# Patient Record
Sex: Female | Born: 1940 | Race: White | Hispanic: No | Marital: Married | Smoking: Former smoker
Health system: Southern US, Community
[De-identification: ages and names within clinical notes are randomized; demographics above are authoritative.]

## PROBLEM LIST (undated history)

## (undated) DIAGNOSIS — B009 Herpesviral infection, unspecified: Secondary | ICD-10-CM

## (undated) DIAGNOSIS — M8580 Other specified disorders of bone density and structure, unspecified site: Secondary | ICD-10-CM

## (undated) DIAGNOSIS — N83202 Unspecified ovarian cyst, left side: Secondary | ICD-10-CM

## (undated) DIAGNOSIS — K582 Mixed irritable bowel syndrome: Secondary | ICD-10-CM

## (undated) DIAGNOSIS — K219 Gastro-esophageal reflux disease without esophagitis: Secondary | ICD-10-CM

## (undated) DIAGNOSIS — I639 Cerebral infarction, unspecified: Secondary | ICD-10-CM

## (undated) DIAGNOSIS — R569 Unspecified convulsions: Secondary | ICD-10-CM

## (undated) DIAGNOSIS — E559 Vitamin D deficiency, unspecified: Secondary | ICD-10-CM

## (undated) DIAGNOSIS — J189 Pneumonia, unspecified organism: Secondary | ICD-10-CM

## (undated) DIAGNOSIS — Z8742 Personal history of other diseases of the female genital tract: Secondary | ICD-10-CM

## (undated) DIAGNOSIS — Z85820 Personal history of malignant melanoma of skin: Secondary | ICD-10-CM

## (undated) DIAGNOSIS — D0462 Carcinoma in situ of skin of left upper limb, including shoulder: Secondary | ICD-10-CM

## (undated) DIAGNOSIS — Z8619 Personal history of other infectious and parasitic diseases: Secondary | ICD-10-CM

## (undated) DIAGNOSIS — N83201 Unspecified ovarian cyst, right side: Secondary | ICD-10-CM

## (undated) DIAGNOSIS — C439 Malignant melanoma of skin, unspecified: Secondary | ICD-10-CM

## (undated) DIAGNOSIS — K589 Irritable bowel syndrome without diarrhea: Secondary | ICD-10-CM

## (undated) DIAGNOSIS — A0472 Enterocolitis due to Clostridium difficile, not specified as recurrent: Secondary | ICD-10-CM

## (undated) DIAGNOSIS — Z973 Presence of spectacles and contact lenses: Secondary | ICD-10-CM

## (undated) DIAGNOSIS — C801 Malignant (primary) neoplasm, unspecified: Secondary | ICD-10-CM

## (undated) DIAGNOSIS — N950 Postmenopausal bleeding: Secondary | ICD-10-CM

## (undated) DIAGNOSIS — I2699 Other pulmonary embolism without acute cor pulmonale: Secondary | ICD-10-CM

## (undated) DIAGNOSIS — L900 Lichen sclerosus et atrophicus: Secondary | ICD-10-CM

## (undated) DIAGNOSIS — R9389 Abnormal findings on diagnostic imaging of other specified body structures: Secondary | ICD-10-CM

## (undated) DIAGNOSIS — A6000 Herpesviral infection of urogenital system, unspecified: Secondary | ICD-10-CM

## (undated) HISTORY — DX: Unspecified ovarian cyst, right side: N83.201

## (undated) HISTORY — PX: OTHER SURGICAL HISTORY: SHX169

## (undated) HISTORY — DX: Lichen sclerosus et atrophicus: L90.0

## (undated) HISTORY — PX: CATARACT EXTRACTION W/ INTRAOCULAR LENS IMPLANT: SHX1309

## (undated) HISTORY — DX: Irritable bowel syndrome without diarrhea: K58.9

## (undated) HISTORY — DX: Vitamin D deficiency, unspecified: E55.9

## (undated) HISTORY — DX: Unspecified convulsions: R56.9

## (undated) HISTORY — DX: Cerebral infarction, unspecified: I63.9

## (undated) HISTORY — DX: Other pulmonary embolism without acute cor pulmonale: I26.99

## (undated) HISTORY — DX: Other specified disorders of bone density and structure, unspecified site: M85.80

## (undated) HISTORY — DX: Malignant melanoma of skin, unspecified: C43.9

## (undated) HISTORY — DX: Herpesviral infection, unspecified: B00.9

## (undated) HISTORY — DX: Unspecified ovarian cyst, right side: N83.202

## (undated) HISTORY — DX: Carcinoma in situ of skin of left upper limb, including shoulder: D04.62

## (undated) HISTORY — DX: Enterocolitis due to Clostridium difficile, not specified as recurrent: A04.72

---
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
## 1967-04-19 HISTORY — PX: VARICOSE VEIN SURGERY: SHX832

## 1970-04-18 HISTORY — PX: DILATION AND CURETTAGE OF UTERUS: SHX78

## 1973-04-18 DIAGNOSIS — I2699 Other pulmonary embolism without acute cor pulmonale: Secondary | ICD-10-CM

## 1973-04-18 DIAGNOSIS — Z86711 Personal history of pulmonary embolism: Secondary | ICD-10-CM

## 1973-04-18 HISTORY — PX: BREAST ENHANCEMENT SURGERY: SHX7

## 1973-04-18 HISTORY — DX: Other pulmonary embolism without acute cor pulmonale: I26.99

## 1973-04-18 HISTORY — DX: Personal history of pulmonary embolism: Z86.711

## 1973-04-18 HISTORY — PX: TUBAL LIGATION: SHX77

## 1973-04-18 HISTORY — PX: AUGMENTATION MAMMAPLASTY: SUR837

## 1994-04-18 DIAGNOSIS — I6930 Unspecified sequelae of cerebral infarction: Secondary | ICD-10-CM

## 1994-04-18 DIAGNOSIS — I639 Cerebral infarction, unspecified: Secondary | ICD-10-CM

## 1994-04-18 HISTORY — DX: Cerebral infarction, unspecified: I63.9

## 1994-04-18 HISTORY — DX: Unspecified sequelae of cerebral infarction: I69.30

## 1997-07-17 HISTORY — PX: MELANOMA EXCISION: SHX5266

## 2004-05-12 ENCOUNTER — Ambulatory Visit: Payer: Self-pay | Admitting: Internal Medicine

## 2004-05-21 ENCOUNTER — Other Ambulatory Visit: Admission: RE | Admit: 2004-05-21 | Discharge: 2004-05-21 | Payer: Self-pay | Admitting: Internal Medicine

## 2004-05-21 ENCOUNTER — Ambulatory Visit: Payer: Self-pay | Admitting: Internal Medicine

## 2004-06-09 ENCOUNTER — Ambulatory Visit: Payer: Self-pay | Admitting: Internal Medicine

## 2004-09-16 HISTORY — PX: CATARACT EXTRACTION: SUR2

## 2005-01-03 ENCOUNTER — Ambulatory Visit: Payer: Self-pay | Admitting: Internal Medicine

## 2005-03-02 ENCOUNTER — Ambulatory Visit: Payer: Self-pay | Admitting: Internal Medicine

## 2005-04-18 DIAGNOSIS — K449 Diaphragmatic hernia without obstruction or gangrene: Secondary | ICD-10-CM

## 2005-04-18 HISTORY — DX: Diaphragmatic hernia without obstruction or gangrene: K44.9

## 2005-05-10 ENCOUNTER — Ambulatory Visit: Payer: Self-pay | Admitting: Internal Medicine

## 2005-07-18 ENCOUNTER — Ambulatory Visit: Payer: Self-pay | Admitting: Internal Medicine

## 2005-09-27 ENCOUNTER — Ambulatory Visit: Payer: Self-pay | Admitting: Internal Medicine

## 2005-10-05 ENCOUNTER — Encounter: Admission: RE | Admit: 2005-10-05 | Discharge: 2005-10-05 | Payer: Self-pay | Admitting: Internal Medicine

## 2005-10-21 ENCOUNTER — Ambulatory Visit: Payer: Self-pay

## 2005-10-27 ENCOUNTER — Ambulatory Visit: Payer: Self-pay | Admitting: Internal Medicine

## 2005-11-11 ENCOUNTER — Encounter: Admission: RE | Admit: 2005-11-11 | Discharge: 2005-11-11 | Payer: Self-pay | Admitting: Obstetrics and Gynecology

## 2006-02-02 ENCOUNTER — Ambulatory Visit: Payer: Self-pay | Admitting: *Deleted

## 2006-02-02 ENCOUNTER — Inpatient Hospital Stay (HOSPITAL_COMMUNITY): Admission: EM | Admit: 2006-02-02 | Discharge: 2006-02-04 | Payer: Self-pay | Admitting: Emergency Medicine

## 2006-02-05 ENCOUNTER — Ambulatory Visit (HOSPITAL_COMMUNITY): Admission: RE | Admit: 2006-02-05 | Discharge: 2006-02-05 | Payer: Self-pay | Admitting: Gastroenterology

## 2006-02-08 ENCOUNTER — Ambulatory Visit: Payer: Self-pay | Admitting: Internal Medicine

## 2006-02-21 ENCOUNTER — Ambulatory Visit: Payer: Self-pay | Admitting: Internal Medicine

## 2006-02-23 ENCOUNTER — Ambulatory Visit: Payer: Self-pay | Admitting: Cardiology

## 2006-05-12 ENCOUNTER — Ambulatory Visit: Payer: Self-pay | Admitting: Internal Medicine

## 2006-07-17 ENCOUNTER — Encounter (INDEPENDENT_AMBULATORY_CARE_PROVIDER_SITE_OTHER): Payer: Self-pay | Admitting: *Deleted

## 2006-07-17 ENCOUNTER — Ambulatory Visit (HOSPITAL_COMMUNITY): Admission: RE | Admit: 2006-07-17 | Discharge: 2006-07-17 | Payer: Self-pay | Admitting: Internal Medicine

## 2006-07-17 ENCOUNTER — Ambulatory Visit: Payer: Self-pay | Admitting: *Deleted

## 2006-07-17 ENCOUNTER — Ambulatory Visit: Payer: Self-pay | Admitting: Internal Medicine

## 2006-08-01 ENCOUNTER — Ambulatory Visit: Payer: Self-pay | Admitting: Internal Medicine

## 2006-08-12 ENCOUNTER — Encounter: Admission: RE | Admit: 2006-08-12 | Discharge: 2006-08-12 | Payer: Self-pay | Admitting: Internal Medicine

## 2006-09-01 ENCOUNTER — Ambulatory Visit: Payer: Self-pay | Admitting: Family Medicine

## 2006-09-01 LAB — CONVERTED CEMR LAB
ALT: 19 units/L (ref 0–40)
AST: 21 units/L (ref 0–37)
Basophils Relative: 0.5 % (ref 0.0–1.0)
Bilirubin, Direct: 0.1 mg/dL (ref 0.0–0.3)
Calcium: 9.6 mg/dL (ref 8.4–10.5)
Chloride: 105 meq/L (ref 96–112)
Creatinine, Ser: 0.7 mg/dL (ref 0.4–1.2)
Eosinophils Absolute: 0.1 10*3/uL (ref 0.0–0.6)
Eosinophils Relative: 1.1 % (ref 0.0–5.0)
GFR calc non Af Amer: 90 mL/min
Glucose, Bld: 107 mg/dL — ABNORMAL HIGH (ref 70–99)
HCT: 43.1 % (ref 36.0–46.0)
HDL: 75.5 mg/dL (ref >39.0)
MCV: 93.5 fL (ref 78.0–100.0)
Neutrophils Relative %: 56.6 % (ref 43.0–77.0)
Platelets: 222 10*3/uL (ref 150–400)
RBC: 4.61 M/uL (ref 3.87–5.11)
RDW: 13 % (ref 11.5–14.6)
Total Bilirubin: 0.8 mg/dL (ref 0.3–1.2)
Total CHOL/HDL Ratio: 3.1
Triglycerides: 67 mg/dL (ref 0–149)
VLDL: 13 mg/dL (ref 0–40)
WBC: 4.6 10*3/uL (ref 4.5–10.5)

## 2006-09-04 ENCOUNTER — Ambulatory Visit: Payer: Self-pay | Admitting: Family Medicine

## 2006-09-05 LAB — CONVERTED CEMR LAB: Vitamin B-12: 208 pg/mL — ABNORMAL LOW (ref 211–911)

## 2006-09-13 ENCOUNTER — Encounter: Payer: Self-pay | Admitting: Internal Medicine

## 2006-09-13 ENCOUNTER — Ambulatory Visit: Payer: Self-pay | Admitting: Vascular Surgery

## 2006-10-24 ENCOUNTER — Other Ambulatory Visit: Admission: RE | Admit: 2006-10-24 | Discharge: 2006-10-24 | Payer: Self-pay | Admitting: *Deleted

## 2006-11-14 DIAGNOSIS — Z86718 Personal history of other venous thrombosis and embolism: Secondary | ICD-10-CM

## 2006-11-14 DIAGNOSIS — R569 Unspecified convulsions: Secondary | ICD-10-CM

## 2006-11-14 DIAGNOSIS — Z8679 Personal history of other diseases of the circulatory system: Secondary | ICD-10-CM | POA: Insufficient documentation

## 2006-11-14 DIAGNOSIS — J309 Allergic rhinitis, unspecified: Secondary | ICD-10-CM | POA: Insufficient documentation

## 2006-12-11 ENCOUNTER — Ambulatory Visit (HOSPITAL_COMMUNITY): Admission: RE | Admit: 2006-12-11 | Discharge: 2006-12-11 | Payer: Self-pay | Admitting: *Deleted

## 2006-12-12 ENCOUNTER — Encounter: Admission: RE | Admit: 2006-12-12 | Discharge: 2006-12-12 | Payer: Self-pay | Admitting: *Deleted

## 2007-11-17 HISTORY — PX: CATARACT EXTRACTION: SUR2

## 2007-12-13 ENCOUNTER — Encounter: Admission: RE | Admit: 2007-12-13 | Discharge: 2007-12-13 | Payer: Self-pay | Admitting: Internal Medicine

## 2008-02-05 ENCOUNTER — Other Ambulatory Visit: Admission: RE | Admit: 2008-02-05 | Discharge: 2008-02-05 | Payer: Self-pay | Admitting: Obstetrics and Gynecology

## 2008-08-11 ENCOUNTER — Encounter: Admission: RE | Admit: 2008-08-11 | Discharge: 2008-08-11 | Payer: Self-pay | Admitting: Internal Medicine

## 2008-08-12 IMAGING — MG MM SCREENING W/IMPLANTS
9 series · 9 of 9 positions shown · non-contrast
Comparison: Prior studies.

DG SCREENING W/IMPLANTS
Bilateral CC and MLO view(s) were taken.
Prior study comparison: November 11, 2005, dG diagnostic bilateral.

DIGITAL SCREENING MAMMOGRAM W/IMPLANTS WITH CAD:

[R CC]
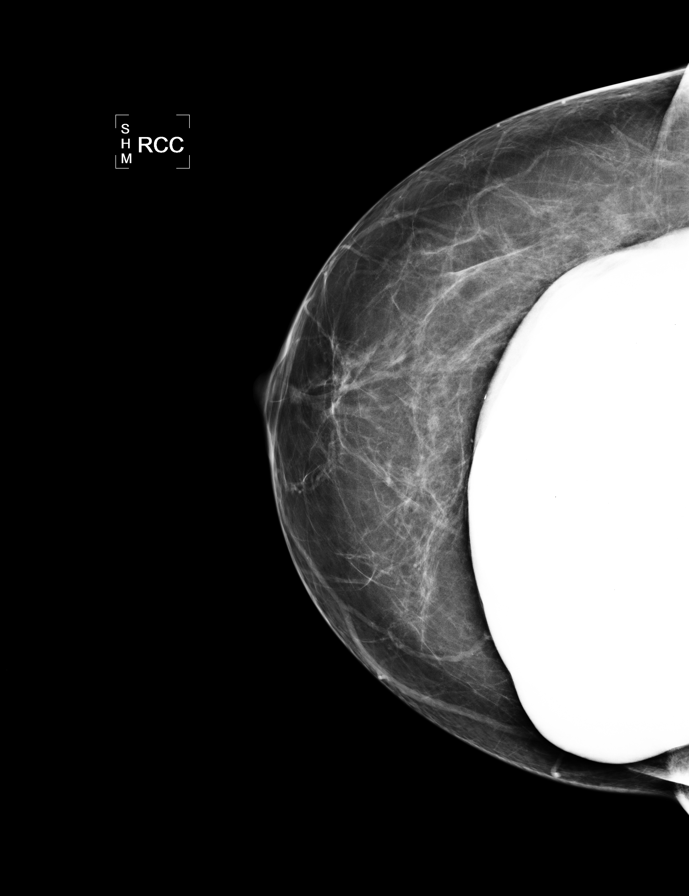

[L CC]
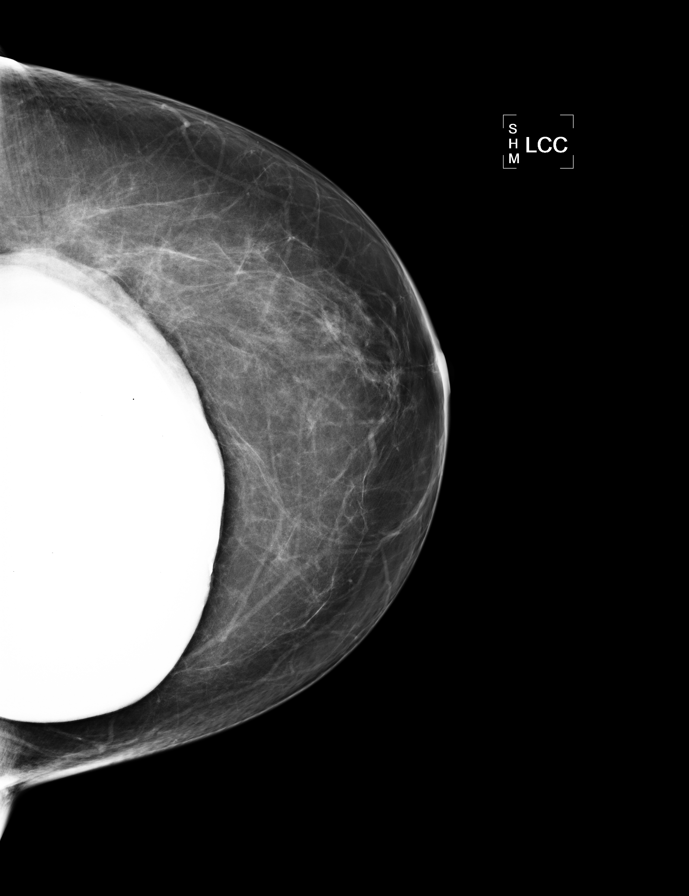

[L MLO]
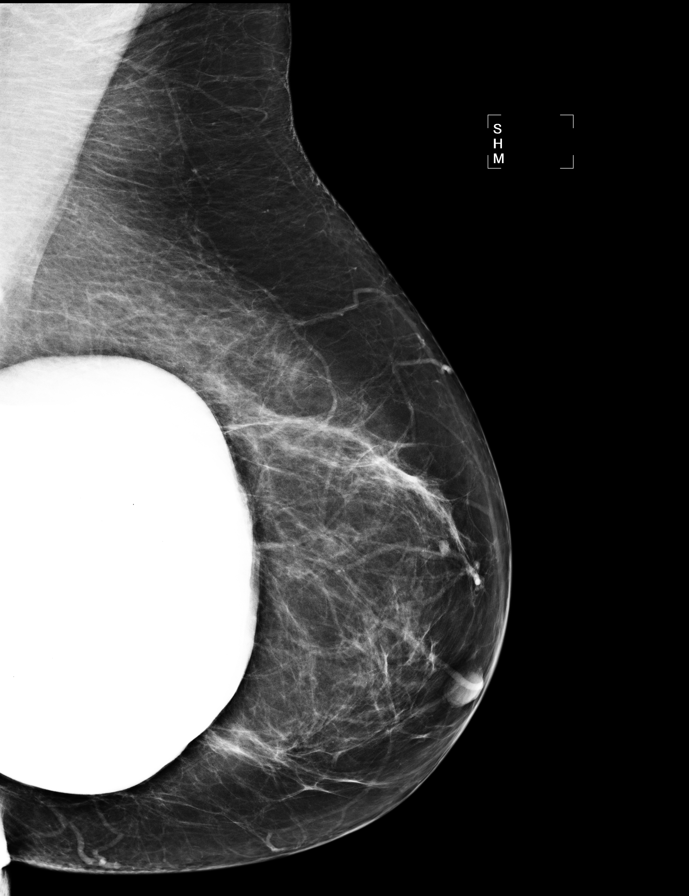

[R MLO]
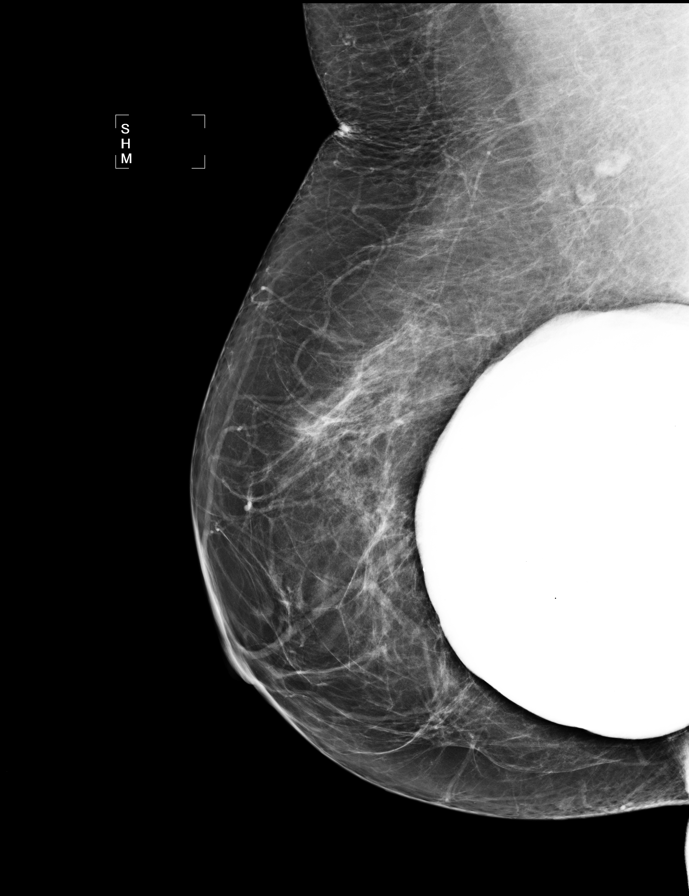

[R CCID]
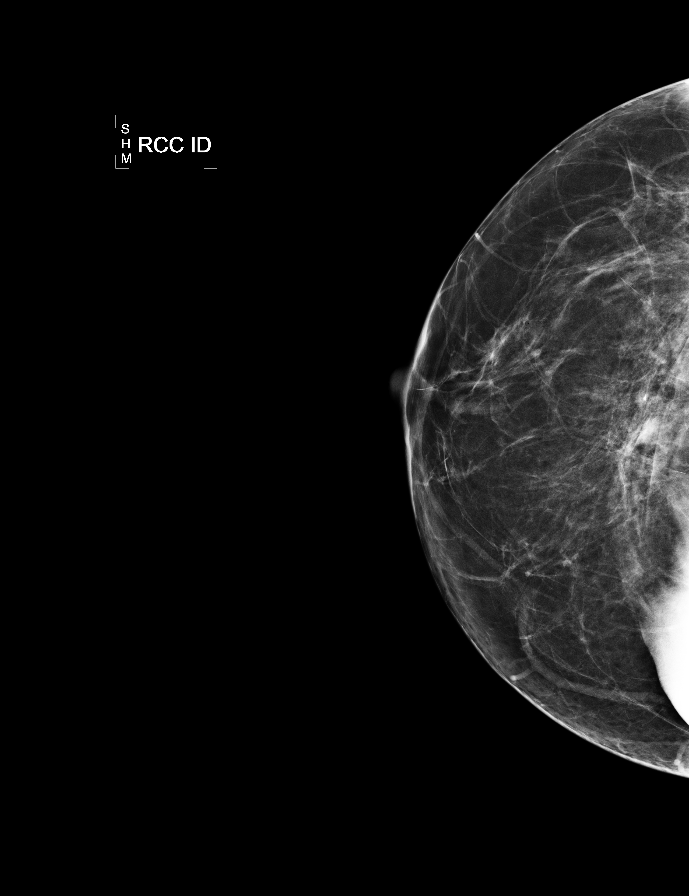

[L CCID (1 of 2)]
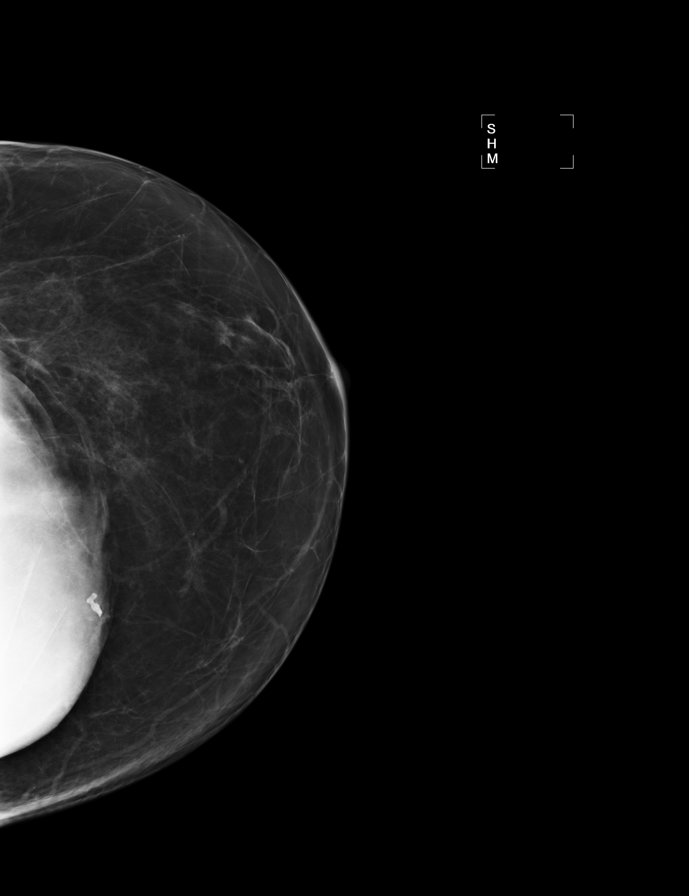

[L MLOID]
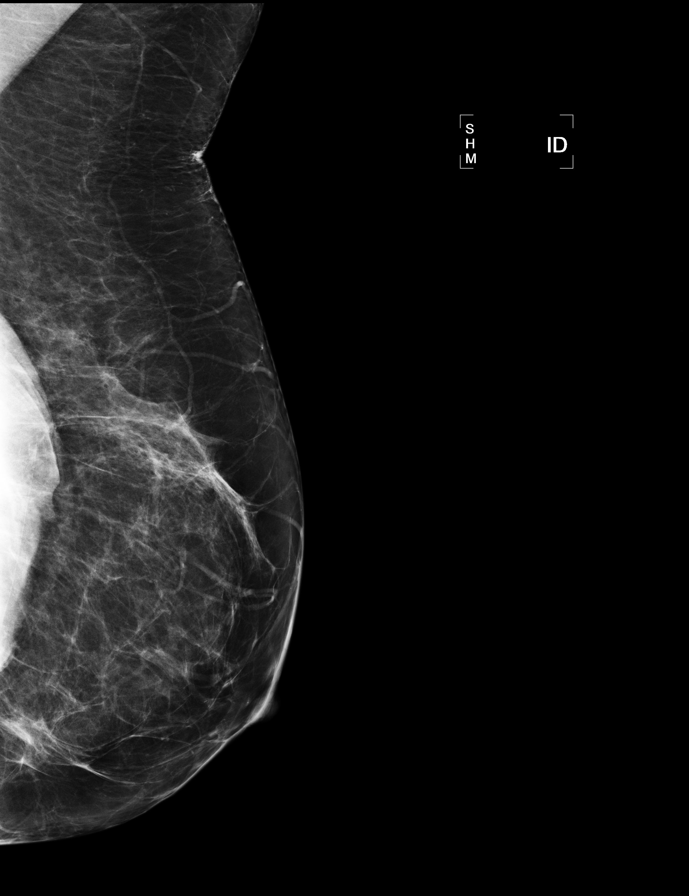

[R MLOID]
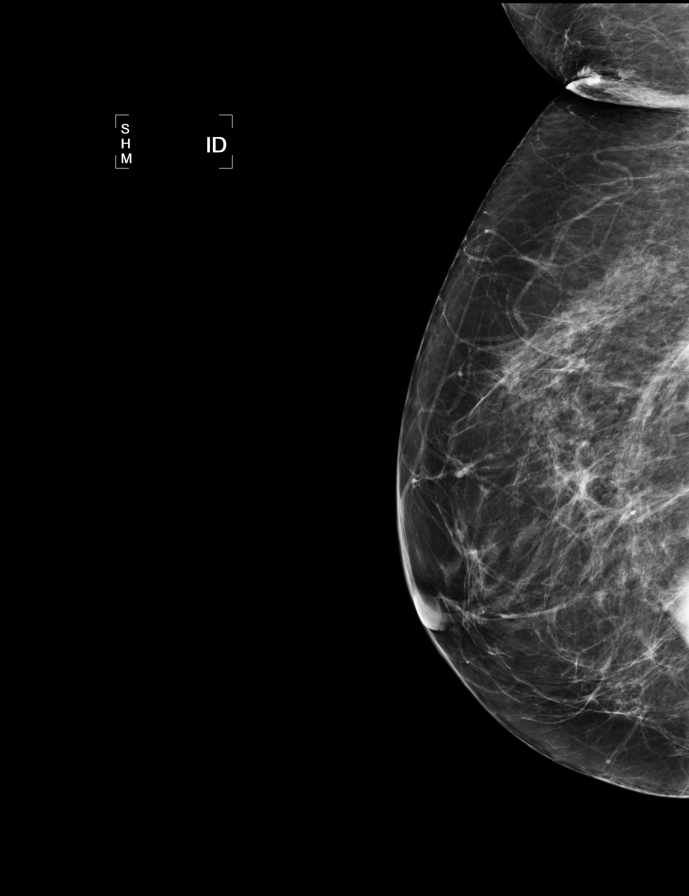

[L CCID (2 of 2)]
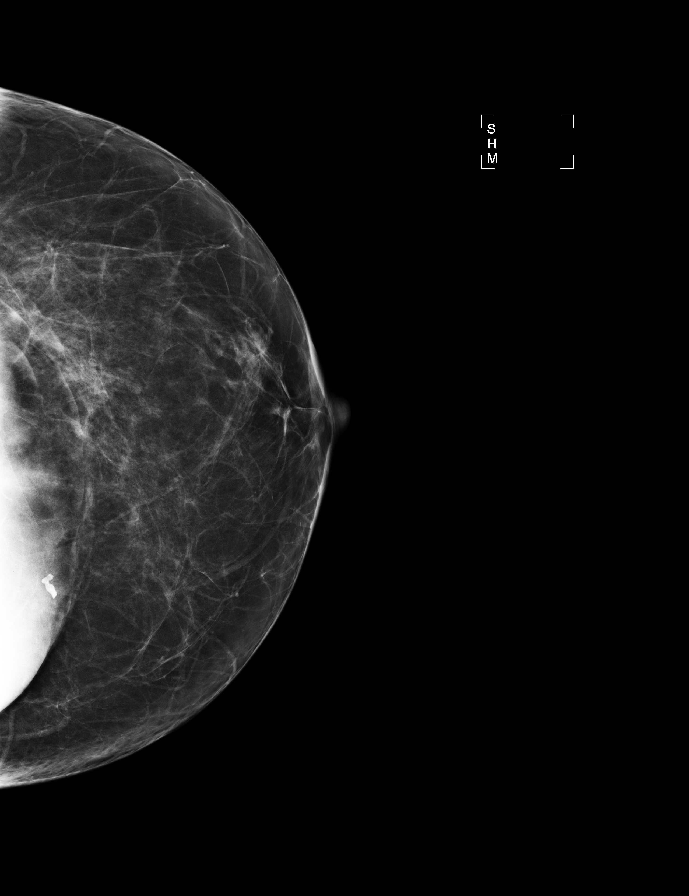

[9 of 9 positions shown; findings below may reference images not displayed]

There are subglandular implants.  Implant included and implant displaced views are obtained.

There are scattered fibroglandular densities.  There is no dominant mass, architectural distortion 
or calcification to suggest malignancy.
IMPRESSION: No mammographic evidence of malignancy.  Suggest yearly screening

ASSESSMENT: Negative - BI-RADS 1

Screening mammogram of both breasts in 1 year.
ANALYZED BY COMPUTER AIDED DETECTION. , THIS PROCEDURE WAS A DIGITAL MAMMOGRAM.

## 2008-08-19 ENCOUNTER — Encounter: Admission: RE | Admit: 2008-08-19 | Discharge: 2008-08-19 | Payer: Self-pay | Admitting: Internal Medicine

## 2008-09-17 ENCOUNTER — Encounter: Admission: RE | Admit: 2008-09-17 | Discharge: 2008-09-17 | Payer: Self-pay | Admitting: Family Medicine

## 2008-12-15 ENCOUNTER — Encounter: Admission: RE | Admit: 2008-12-15 | Discharge: 2008-12-15 | Payer: Self-pay | Admitting: Internal Medicine

## 2009-12-16 ENCOUNTER — Encounter: Admission: RE | Admit: 2009-12-16 | Discharge: 2009-12-16 | Payer: Self-pay | Admitting: Obstetrics and Gynecology

## 2010-03-24 ENCOUNTER — Other Ambulatory Visit
Admission: RE | Admit: 2010-03-24 | Discharge: 2010-03-24 | Payer: Self-pay | Source: Home / Self Care | Admitting: Obstetrics and Gynecology

## 2010-08-02 ENCOUNTER — Encounter (INDEPENDENT_AMBULATORY_CARE_PROVIDER_SITE_OTHER): Payer: Medicare Other

## 2010-08-02 ENCOUNTER — Encounter (INDEPENDENT_AMBULATORY_CARE_PROVIDER_SITE_OTHER): Payer: Medicare Other | Admitting: Vascular Surgery

## 2010-08-02 DIAGNOSIS — I83893 Varicose veins of bilateral lower extremities with other complications: Secondary | ICD-10-CM

## 2010-08-02 NOTE — Consult Note (Signed)
NEW PATIENT CONSULTATION  [name], [name] DOB:  Nov 01, 1940                                       08/02/2010 CHART#:[id number]  The patient is a 70 year old female complaining of bilaterally lower extremity pain, right worse than left, with venous insufficiency.  She has a remote history of DVT in the right popliteal and superficial femoral vein occurring 2 years ago which was treated with aspirin only because of a history of a hemorrhagic stroke.  She describes aching discomfort in both legs in the thigh and calf area particularly behind the right knee but also involving the left leg.  These symptoms can occur while walking, while in bed at night or any time of day and are slightly improved by elevation but not totally relieved.  She has a history of bilateral pulmonary emboli 35 years ago following breast surgery and tubal ligation.  She was evaluated by Dr. Festus Barren in [place] a year ago who recommended "surgery" on the right leg but she never returned for her 3 month followup visit.  She does have elastic compression stockings which she does not wear on a daily basis.  She has no history of bleeding, ulceration or superficial thrombophlebitis or bulging varicosities.  CHRONIC MEDICAL PROBLEMS: 1. History of hemorrhagic stroke. 2. History of right leg DVT. 3. History of melanoma left shoulder previously excised in 1999. 4. History of cataracts. 5. Irritable bowel syndrome. 6. Possible neuropathy. 7. Negative for coronary artery disease, diabetes or COPD.  SOCIAL HISTORY:  She is married, has 3 children, is retired, has not smoked since 1991, drinks occasional glass of wine.  FAMILY HISTORY:  Positive for coronary artery disease and diabetes in her father.  Negative for stroke.  REVIEW OF SYSTEMS:  Positive for weight gain, leg discomfort with walking, history of hemorrhagic stroke in '96, DVT in 2010.  No chest pain or dyspnea on exertion.  No  chronic bronchitis.  Does have a hiatal hernia and occasional constipation.  All other systems are negative in a complete review of systems.  PHYSICAL EXAMINATION:  Vital signs:  Blood pressure 98/72, heart rate 64, respirations 20.  General:  She is a well-developed, well-nourished female in no apparent distress, alert and oriented x3.  HEENT:  Exam is normal for age.  EOMs intact.  Lungs:  Clear to auscultation.  No rhonchi or wheezing.  Cardiovascular:  Regular rhythm.  No murmurs. Carotid pulses 3+, no bruits.  Abdomen:  Soft, nontender with no masses. Musculoskeletal:  Free of major deformities.  Neurological:  Normal. Skin:  Free of rashes.  There are diffuse reticular and spider veins throughout both lower extremities in the anterior thigh, posterior calf particularly in the popliteal area and popliteal fossa on the right side.  There are no bulging varicosities noted.  No hyperpigmentation or ulceration is noted.  She has 3+ femoral and dorsalis pedis pulses palpable bilaterally.  Today I ordered lower extremity venous duplex exam which I reviewed and interpreted.  Left leg is totally normal.  Right leg has reflux in the right great saphenous vein but it is a small vein with maximum distal diameter of 0.34 cm.  There is also some reflux in the right small saphenous vein ranging from 0.31 to 0.25 in diameter.  There is some mild reflux in the right deep system consistent with a previous DVT but  there is no obstruction currently.  I do not think her venous pathology is severe enough to be causing the symptoms in her right leg and this does not explain why her left leg is almost equally symptomatic.  She has no arterial insufficiency.  I think long leg elastic compression stockings worn on a daily basis along with elevation of the legs at night would be helpful.  If she continues to have symptoms possibly a neurologic evaluation would be in order to see if she has neuropathy.  If  her symptoms continue to worsen in the right leg and she develops bulging varicosities then laser ablation of her right great saphenous vein might be indicated at the time but at the current time I do not think that would relieve her symptoms.    [name] [name], M.D. Electronically Signed  JDL/MEDQ  D:  08/02/2010  T:  08/02/2010  Job:  [id number]  cc:   Thora Lance, M.D.

## 2010-08-10 NOTE — Procedures (Unsigned)
LOWER EXTREMITY VENOUS REFLUX EXAM  INDICATION:  Painful varicose veins.  EXAM:  Using color-flow imaging and pulse Doppler spectral analysis, the bilateral common femoral, superficial femoral, popliteal, posterior tibial, greater and lesser saphenous veins are evaluated.  There is evidence suggesting deep venous insufficiency in the right lower extremity.  The right saphenofemoral junction is not competent with Reflux of >543milliseconds. The right GSV is not competent with Reflux of >581milliseconds with the caliber as described below.  The left GSV is competent.  The right proximal short small saphenous vein demonstrates incompetency with caliber ranging from 0.31 to 0.25 cm.  GSV Diameter (used if found to be incompetent only)                                                 Right     Left                Proximal Greater Saphenous Vein     0.63 cm                                                 cm                      Proximal-to-mid-thigh   0.34 cm                                                 cm Mid thigh                                       0.33 cm   cm Mid-distal thigh                                cm        cm Distal thigh                                    0.31 cm   cm Knee                                            0.33 cm   cm  IMPRESSION: 1. Right great saphenous vein is not competent reflux of     >514milliseconds. 2. The right great saphenous vein is not tortuous. 3. The deep venous system is not competent on the with Reflux of     >548milliseconds. 4. The right small saphenous vein is not competent with reflux of     >518milliseconds. 5. 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 6. The left lower extremity appears to be within normal limits.  ___________________________________________ [name] [place], M.D.  LT/MEDQ  D:  08/02/2010  T:  08/02/2010  Job:  [id number] 

## 2010-08-31 NOTE — Consult Note (Signed)
VASCULAR SURGERY CONSULTATION   [name], [name]  DOB:  12/31/1940                                       09/13/2006  CHART#:[id number]   I saw [name] in the office today in consultation concerning some  pain in her left foot and also some bilateral lower extremity leg  swelling. In addition, she has had some burning pain in her feet.   This patient states that she noticed the sudden onset of pain along the  medial aspect of her left foot and ankle on July 14, 2006. This has  been very slow to heal. Of note, she right before this had been on a  long trip, and because of the risk for DVT, she did undergo a venous  Duplex scan of the left leg at [facility] on July 17, 2006  which showed no evidence of DVT. This wound has been slow to heal, and  ultimately as part of her workup, she underwent a MRI which showed  evidence of a ligament strain in the left foot. Despite continued  treatment with compression and nonsteroidals, she had been having some  pain and was sent for vascular consultation.   She denies any history of claudication, rest pain or nonhealing ulcers  on her feet. She does get some cramps in her toes at night. She is  unaware of any history of DVT although she states that she had a PE back  in 1975. She also states that she had some problems with phlebitis in  the 1980s. She has had no history of lymphedema that she is aware of.  Her only abdominal surgery was an appendectomy. She has had no history  of radiation therapy.   Her past medical history is significant for a hemorrhagic stroke in  1996. She denies any history of diabetes, hypertension,  hypercholesterolemia, history of previous myocardial infarction or  history of congestive heart failure.   On family history, her father had a heart attack in his 94s. She is  unaware of any history of premature cardiovascular disease.   On social history, she is married. She has 3  children. She quit tobacco  in 1991.   ALLERGIES:  No known drug allergies.   MEDICATIONS:  1. Topamax 100 mg 1 in the morning and 2 in the evening.  2. Protonix 40 mg p.o. daily.  3. Zyrtec p.r.n.   REVIEW OF SYSTEMS:  Is documented on the medical history form in her  chart.   On physical examination, blood pressure 133/79, heart rate is 65. I do  not detect any carotid bruits. Lungs are clear bilaterally to  auscultation. On cardiac exam, she has a regular rate and rhythm.  Abdomen is soft and nontender. I cannot palpate an aneurysm. She has  normal femoral pulses and palpable dorsalis pedis and posterior tibial  pulses bilaterally.   Doppler study shows biphasic Doppler signals in the dorsalis pedis and  posterior tibial positions bilaterally. She does have significant spider  veins of both lower extremities. She has very mild bilateral lower  extremity swelling. She has no evidence of atheroembolic disease.   Given the sudden onset of the pain on July 14, 2006 and the MRI finding  of the strained ligament, I would think that her foot pain is related to  this and has been treated appropriately  with elevation, compression and  nonsteroidal anti-inflammatory medications. Unfortunately, this wounds  are very slow to heal because it is difficult to fully rest the  ligaments in the foot. I believe she is scheduled to see a podiatrist  who could perhaps get her fitted for a boot to allow her to rest the  strained ligament. With respect to her mild leg swelling, she has very  mild swelling, and I have simply recommended that she elevate her legs  intermittently during the day, and once her foot heals, she could be  fitted for some compression stockings with a mild gradient. She had no  significant hyperpigmentation to suggest significant chronic venous  insufficiency, and I did not think a repeat venous Duplex study or  venous reflux study would be helpful. I will be happy to see  her back at  any time if any new vascular problems arise. I did reassure her that I  did not think her left foot pain was related to any vascular problems.  With respect to the burning pain in her feet, I would agree that she  simply has a neuropathy.   [name]. [name], M.D.  Electronically Signed  CSD/MEDQ  D:  09/13/2006  T:  09/13/2006  Job:  [id number]   cc:   Gordy Savers, MD

## 2010-09-03 NOTE — Discharge Summary (Signed)
NAMEHEILEY, SHAIKH              ACCOUNT NO.:  [id number]   MEDICAL RECORD NO.:  [id number]          PATIENT TYPE:  INP   LOCATION:  2027                         FACILITY:  [facility]   PHYSICIAN:  Gerrit Friends. Dietrich Pates, MD, FACCDATE OF BIRTH:  1941-03-17   DATE OF ADMISSION:  02/02/2006  DATE OF DISCHARGE:  02/04/2006                                 DISCHARGE SUMMARY   PRIMARY CARE PHYSICIAN:  Dr. Bing Matter.   PRIMARY CARDIOLOGIST:  The patient is new to Dr. Dietrich Pates.   GASTROENTEROLOGIST:  Dr. Stan Head.   REASON FOR ADMISSION:  Chest and abdominal pain.   DISCHARGE DIAGNOSES:  1. Chest and abdominal pain, etiology unclear.      a.     Exercise Myoview study negative for ischemia.  Ejection fraction       65%.      b.     Chest CT negative for pulmonary emboli.  2. History of left hemorrhagic stroke, 1996.  3. History of melena, resected 1999.  4. History of pulmonary embolus, status post surgery in 1975.  5. Seizure disorder.  6. Seasonal allergic rhinitis.  7. Questionable irritable bowel syndrome.  8. Left ovarian cyst.  9. Left renal cyst.  10.History of breast augmentation.  11.Past history of tobacco use with 30 to 40 pack years.   HISTORY:  Ms. Daleo is a very pleasant 70 year old patient, followed by  Dr. Bing Matter, who presented to [facility] on the date of admission  with complaints of 48 hours of pressure sensation in her epigastrium.  Her  risk factors were lacking for cardiac disease, except for a history of  smoking.  She was admitted for further evaluation and treatment.   HOSPITAL COURSE:  The patient ruled out for myocardial infarction by  enzymes.  She was seen in a followup by Dr. [name] on February 03, 2006.  She  recommended inpatient Myoview testing, as well as a gastroenterology  consult.  Dr. [name] saw the patient on February 03, 2006.  He recommended a  gallbladder ultrasound to exclude gallstones.  He also recommended  intravaginal  ultrasound to followup on her left ovarian cyst.  He felt that  if this was negative, the patient could probably go for an upper endoscopy.  He also recommended that this could be done as an outpatient if needed.  The  patient's D-dimer came back elevated at 0.9.  She went for her stress test  on February 04, 2006.  She walked for 3 minutes and became significantly  short of breath.  The test was discontinued secondary to fatigue and  shortness of breath.  Her heart rate went up into the 160s, which was 100%  of her age predicted maximal heart rate.  She had no ECG changes.  Her  Myoview images revealed no ischemia, normal wall motion, EF 65%.  Because of  her significant shortness of breath and elevated D-dimer, she was sent for a  chest CT.  This revealed no pulmonary emboli.  There was a question of mild  LVH.  A small hiatal hernia was noted.  The  results were discussed with Dr.  Ladona Ridgel, who recommended, that the patient could be followed up as an  outpatient for further GI workup.  This was discussed with the patient and  her husband.  Their son is getting married in [place] next week.  They are  agreeable to discharge to home today with follow up with Dr. [name] as an  outpatient.   LABS AND ANCILLARY DATA:  CT angiogram of the chest as noted above.  Pelvic  ultrasound with a 1.5 cm simple cyst of the left ovary, normal pelvic  ultrasound otherwise.  Abdominal ultrasound, no significant abnormalities.  No gallstones.  A 2.5 cm simple cyst on the mid left kidney.  Abdominal x-  ray, normal study.  Chest x-ray, no acute findings.  Of note, her left  ovarian cyst was 1.4 cm in diameter by MRI in June of 2007.  Stress Myoview  study, as noted above, negative for ischemia.  Normal wall motion, EF 65%.  White count 5000, hemoglobin 13.7, hematocrit 39.3, platelet count 192,000.  D-dimer 0.9.  INR 1.0.  Sodium 139, potassium 3.6, chloride 113, CO2 21,  glucose 89, BUN 13, creatinine 0.8,  calcium 9.3.  Total bilirubin 0.5,  direct bilirubin 0.2, indirect bilirubin 0.3.  Alkaline phosphatase 103, AST  23, ALT 19, total protein 6.2, albumin 3.8, amylase 55, lipase 28.  Cardiac  markers negative x3.  Total cholesterol 189, triglycerides 61, HDL 66, LDL  111.   DISCHARGE MEDICATIONS:  1. Topamax 100 mg in the morning, 200 mg in the evening.  2. Protonix 40 mg daily.   DIET:  Low-fat, low-sodium diet.   ACTIVITY:  She is to increase her activity slowly.   WOUND CARE:  Not applicable.   FOLLOWUP:  1. The patient has been asked to call Dr. Marvell Fuller office on Monday to      arrange a followup appointment after she gets back from her trip to      [place].  As noted, from the progress note, she will likely need an      upper endoscopy.  2. We will have followup appointment set up with Dr. [name] in the next      several weeks.  3. She should follow up with Dr. Bing Matter as needed to have continued      surveillance of her left ovarian cyst.   The patient has been asked to refrain from too much alcohol or caffeine use  on her trip.  She has also been advised of the foods to avoid on her trip.  She can take her Protonix twice daily while she is gone to help prevent  recurrent symptoms.     ______________________________  Tereso Newcomer, PA-C      Gerrit Friends. Dietrich Pates, MD, [facility]  Electronically Signed    SW/MEDQ  D:  02/04/2006  T:  02/05/2006  Job:  [id number]   cc:   Talbert Forest, MD,FACG

## 2010-09-03 NOTE — Consult Note (Signed)
[name], [name]              ACCOUNT NO.:  [id number]   MEDICAL RECORD NO.:  [id number]          PATIENT TYPE:  INP   LOCATION:  2027                         FACILITY:  [facility]   PHYSICIAN:  Iva Boop, MD,FACGDATE OF BIRTH:  Dec 12, 1940   DATE OF CONSULTATION:  DATE OF DISCHARGE:                                   CONSULTATION   REFERRING PHYSICIAN:  Gerrit Friends. Dietrich Pates, MD, [facility]   REASON FOR CONSULTATION:  Abdominal and chest pain.   ASSESSMENT:  A 71 year old white woman who was admitted with abdominal  heaviness radiating to the chest.  So far, cardiac enzymes are negative x3.  She is awaiting a stress test tomorrow.  The pain has been present for  several days, intermittent, she has pain in the left lower quadrant as well  and just a sort of heaviness that radiates up with mild back pain.  There is  no nausea, vomiting, or association with eating.   DIAGNOSTIC STUDIES:  An MRI of the hip in June 2006 demonstrated a left  ovarian cyst, non-suspicious, but no particular pathology otherwise (she  felt some sort of bulge in the right groin).  She had a colonoscopy 3 years  ago in [place], [place] that she says was fine.   At this point, the etiology of her pain is not clear.  She is sort of  diffusely tender on her abdominal exam and she says stays that way.  It is  probably not cardiac, but that is not ruled out adequately at this point.  She may certainly have irritable bowel syndrome.  Peptic ulcer disease or  gallstone disease is certainly possible as well.   PLAN:  1. Abdominal ultrasound and pelvic ultrasound today (she needs follow up      of the ovarian cystic lesion 3 to 6 months after MRI, not yet      completed).  2. Await stress test.  If that is negative, it would be reasonable to      proceed with an upper GI endoscopy, particularly since she is leaving      for [place] in 4 to 5 days to travel for her son's wedding.  3. Continue a proton pump inhibitor  for the time being.  4. If the workup is negative for any organic problems or causes of this      and we think it is a functional disorder, an antispasmodic would be      appropriate.   HISTORY:  A 70 year old white woman with the above problems.  She has  chronic loose bowel movements, no bleeding.  She has tried antacids at home,  but did not relieve these symptoms.  Cardiac enzymes negative.  EKG  unrevealing.  Awaiting a stress test.  EKG was sinus bradycardia, but no  acute ischemia.   HOME MEDICATIONS:  Topamax 100 mg in the morning, 200 mg in the evening.   HOSPITAL MEDICATIONS:  1. Topamax.  2. Protonix 40 mg b.i.d.  3. Aspirin 81 mg daily.   DRUG ALLERGIES:  None known.   PAST MEDICAL HISTORY:  1. Left hemorrhagic  stroke, 1996.  2. Melanoma resection, 1999.  3. Pulmonary embolism status post breast implant surgery, 1975.  4. Seizure disorder related to the left hemorrhagic stroke.  5. Allergic rhinitis.  6. She may have a diagnosis of irritable bowel syndrome given in the past.  7. Ovarian cyst as above   SOCIAL HISTORY:  A few glasses of wine a day.  She is a retired  [employer] operational officer.  Getting ready to travel to [place].  She  smokes 1/2 pack a day.   FAMILY HISTORY:  Hypertension and diabetes in her parents.  Coronary artery  disease.  There is no colon cancer reported.   REVIEW OF SYSTEMS:  She is anxious.  She has right groin and hip pain.  She  has had problems with atrophic vaginitis.  She did have a history of  depression, took antidepressants for 6 months 2 to 3 years ago.  She has  insomnia problems.  All other review of systems appears negative.   PHYSICAL EXAMINATION:  GENERAL:  A middle-aged white woman in no acute  distress.  VITAL SIGNS:  Temperature 98.1, pulse 59, respirations 20, blood pressure  115/73.  EYES:  Anicteric.  ENT:  Normal mouth, nose, pharynx.  NECK:  Supple with no masses or thyromegaly.  CHEST:  Clear.  Chest wall  nontender.  HEART:  S1, S2.  No rubs or gallops.  ABDOMEN:  Soft.  She had tenderness in the epigastric, right upper quadrant,  and the left lower quadrant.  There is no rebound or guarding to speak of.  She says her abdominal is often this way.  RECTAL:  No stool on the PA's rectal examination.  EXTREMITIES:  No cyanosis, clubbing, or edema.  NEUROLOGIC:  Slightly anxious.  She is alert and oriented x3.  NODES:  No right groin nodes.  There is a small bulge there, but I think it  is sort of fascial change.  There is cervical or supraclavicular adenopathy.   LABORATORY DATA:  Cardiac enzymes negative x3.  Her chest x-ray and single-  view abdomen are negative for acute disease.  D-Dimer is 0.9, which is  slightly high.  Chloride is 113, otherwise normal BMET.  CBC:  White count  5, otherwise normal with a hemoglobin of 13.7.  Amylase and lipase are  normal.   Note:  The D-Dimer elevation as noted.  Defer to cardiology for further  recommendations on that.  She does not appear to be having a pulmonary  embolism.  Given her plans to travel out of the country soon, I am thinking  an expedient workup will be necessary.  LFTs are normal as well   I appreciate the opportunity to care for this patient.      Iva Boop, MD,FACG  Electronically Signed     CEG/MEDQ  D:  02/03/2006  T:  02/04/2006  Job:  [id number]   cc:   Gerrit Friends. Dietrich Pates, MD, [facility]  Gordy Savers, MD

## 2010-09-03 NOTE — Assessment & Plan Note (Signed)
[facility]                           GASTROENTEROLOGY OFFICE NOTE   SHANTINIQUE, PICAZO                     MRN:          [id number]  DATE:02/21/2006                            DOB:          [birth year]    CHIEF COMPLAINT:  Followup of chest pain.   Ms. Lopes was seen by me in the hospital with chest pain.  She was  admitted and ruled out, had a negative stress test, and had a CT of the  chest that was negative for a pulmonary embolism, I believe.  She also had  an abdominal ultrasound negative for gallstones.  She had a followup pelvic  ultrasound which showed a stable left ovarian cyst (previously seen on MRI).  She has not had any recurrent symptoms.  She is on Protonix 40 mg b.i.d. and  sucralfate twice daily.  She had an EGD performed by Dr. Elnoria Howard that showed a  small hiatus hernia and some minimal erosive gastritis the day after  hospitalization.  She feels well, with no recurrent symptoms of chest pain,  and she did not have a history of recurrent heartburn or chest pain prior to  this.  There was no dysphagia.   Please see my consultation note of February 03, 2006, for other details of  her medical history.   PHYSICAL EXAMINATION:  In no acute distress.  VITAL SIGNS:  Height 5 feet 4 inches, weight 162 pounds, blood pressure  120/70, pulse 62.   ASSESSMENT:  Recent problems with chest pain.  She has a hiatal hernia.  She  had some erosive gastritis.  There are no worrisome features here.  It is  not clear what the etiology was, could have been esophageal spasm.  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  Irritable bowel seems to be calmer at this time, other than some  soft bowel movements.   RECOMMENDATIONS AND PLAN:  1. Stop sucralfate.  2. Taper off the Protonix over the next couple of weeks.  3. If she has  recurrent symptoms, she can call me for a Protonix      prescription.  I do not think she has had enough problems to commit to      chronic proton pump inhibitor therapy.  4. Follow up with me as needed otherwise.     Iva Boop, MD,FACG  Electronically Signed    CEG/MedQ  DD: 02/21/2006  DT: 02/22/2006  Job #: [id number]   cc:   Gordy Savers, MD

## 2010-09-03 NOTE — H&P (Signed)
[name], [name] NO.:  [id number]   MEDICAL RECORD NO.:  [id number]          PATIENT TYPE:  EMS   LOCATION:  [facility]                         FACILITY:  [facility]   PHYSICIAN:  Unice Cobble, MD     DATE OF BIRTH:  23-Nov-1940   DATE OF ADMISSION:  02/02/2006  DATE OF DISCHARGE:                                HISTORY & PHYSICAL   PRIMARY MD:  Dr. [name].   TIME SEEN:  2200.   CHIEF COMPLAINT:  Chest pain.   HISTORY OF PRESENT ILLNESS:  This is a 70 year old white female with a  history of hemorrhagic stroke of unknown etiology in 1996 who presents with  greater than 48 hours of pressure in her epigastrium.  The pressure woke her  up from sleep at 5 a.m. two days ago.  There was no association with  shortness of breath, diaphoresis, nausea and vomiting, or presyncope.  It  radiated to her abdomen.  No edema, PND or orthopnea.  The pain has waxed  and waned in the last 2 days with no exacerbating or alleviating factors  (food, activity, over-the-counter antacid and gas preparations).  She says  it feels like a pressure is pushing down on her belly.  The patient,  otherwise, walks 2 miles per day at about 3 miles per hour without issue.  She has noticed that she has become a little bit more short of breath over  the last year.  She also has diarrhea, alternating with constipation that  has been present for approximately 2 to 3 years.  She has had clean  colonoscopies and has been given the tentative diagnosis of irritable bowel  syndrome.   PAST MEDICAL HISTORY:  1. Left hemorrhagic stroke in 1996, unknown etiology.  2. Seizure disorder as a result of number 1.  3. Melanoma resected in 1999.  4. Breast implant and tubal ligation in 1975, complicated by pulmonary      embolism.  5. Seasonal allergic rhinitis.  6. Irritable bowel syndrome?   ALLERGIES:  NO KNOWN DRUG ALLERGIES.   MEDICATIONS:  Topamax 100 mg q.a.m. and 200 mg q.h.s.   SOCIAL HISTORY:   She is married and planning on attending a wedding in  [place] in 1 week of her son.  She is a retired Research scientist (medical) and  worked for some time for [employer].  She smoked approximately 2 packs per day from  her 75s on to about 16 years ago, making approximately 30 to 40 pack year  history.  She drinks 1 to 2 glasses of wine per night.  No illicit drug use.  Exercise as above.   FAMILY HISTORY:  Her mother died at the age of 68 of complications of  hypertension and coronary artery disease.  Her father died at the age of 68.  He had diabetes, coronary artery disease and had a history of CABG.  She has  a brother with high cholesterol and a thyroid disorder.   REVIEW OF SYSTEMS:  No fevers, chills, sweats or weight change.  No  headaches, nasal discharge, bleeding, voice changes, photophobia,  vision/hearing loss or dental issues.  No rashes or lesions.  No shortness  of breath, orthopnea, PND, edema, palpitations, presyncope, claudication,  cough or wheezing.  No frequency, urgency, dysuria or straining of urinary  stream.  No weakness, numbness, mood disturbance, depression or anxiety.  No  arthralgias, joint swellings or deformity.  No nausea or vomiting, bright  red blood per rectum, melena, hematemesis, GERD symptoms.  No polyuria,  polydipsia or skin or hair changes.  She does have mild cold intolerance.   PHYSICAL EXAMINATION:  VITAL SIGNS:  Temperature is afebrile.  Pulse 67.  Respiratory rate 20.  Blood pressure 114/72 with a weight of 164 pounds.  She is saturating 98% on room air.  IN GENERAL:  This is an overweight white female in no acute distress.  HEENT:  Shows NCAT, PERRLA, EOMI, sclerae clear, moist mucous membranes with  normal dentition.  Oropharynx is without erythema or exudate.  NECK:  Supple without  lymphadenopathy, thyromegaly, jugular venous  distention or bruits.  She has no  lymphadenopathy.  HEART:  Has a regular rate and rhythm with a normal S1 and S2.  Her PMI  is  nondisplaced and she has no murmurs, gallops or rubs.  Pulses are 2+ and  equal bilaterally without bruits.  LUNGS:  Clear to auscultation without wheezes, rhonchi or rales.  SKIN:  Shows no rashes or lesions.  She does have some varicosities in her  lower extremities.  ABDOMINAL EXAM:  Soft and tender to moderate palpation in all 4 quadrants.  Her liver is not enlarged and there is no evidence of splenomegaly.  No  masses felt.  No rebound or guarding.  Murphy's and McBurney's point are  negative.  EXTREMITIES:  Warm and well perfused without cyanosis, clubbing or edema.  Musculoskeletal shows no joint deformity or effusions.  NEUROLOGICALLY:  She is alert and oriented x3 with II-XII cranial nerves  grossly intact.  Strength and sensation is 5/5 in all extremities and axial  groups.   Chest x-ray:  No acute cardiopulmonary disease.   EKG shows normal sinus rhythm at 70 beats per minute with normal intervals  and axis without ST changes or T-wave inversions.   LABS:  White blood cell count is pending at this time.  Hemoglobin is 15.0.  Her basic metabolic panel is unremarkable with a creatinine of 1.1.  Her CK-  MB is 1.2 with a troponin of less than 0.05.   ASSESSMENT/PLAN:  This is a 70 year old white female with a history of  smoking who quit 16 years ago, as well as a hemorrhagic stroke of unknown  etiology who presents with pain atypical for acute coronary syndrome.  Her  pain is more consistent with her known diagnosis of irritable bowel  syndrome, although it is possible there is a different abdominal etiology.  We will rule out myocardial infarction with serial markers and a Cardiolite  Myoview stress test will be done at the attending's discretion.  We will  additionally check an abdominal x-ray, lipase, amylase and liver panel.  As  she has not seen gastroenterology at this institution, it may be prudent to get a gastroenterology consult in the morning.            ______________________________  Unice Cobble, MD     ACJ/MEDQ  D:  02/02/2006  T:  02/03/2006  Job:  [id number]

## 2010-09-03 NOTE — Assessment & Plan Note (Signed)
[facility]                              CARDIOLOGY OFFICE NOTE   Latoya, [name]                     MRN:          [id number]  DATE:02/23/2006                            DOB:          08-10-1940    CARDIOLOGIST:  Dr. Dietrich Pates.   PRIMARY CARE [name]:  Dr. Amador [name].   HISTORY OF PRESENT ILLNESS:  [name] is a very pleasant 70 year old  female patient who was admitted to the hospital with chest and abdominal  pain on October 18.  She ruled out for myocardial infarction.  She underwent  a stress Myoview study that showed no ischemia.  She had an elevated D-dimer  and a chest CT was negative for pulmonary emboli.  Gastroenterology saw the  patient and set up for an outpatient endoscopy.  This was done on October  21.  This revealed some gastric erosions.  She was placed on sucralfate and  followed up with Dr. Leone [name].  The patient said that she saw Dr. Leone [name]  recently, who took her off of the sucralfate and reduced her Protonix to 40  mg a day.  She has had no further chest pain or shortness of breath.  She  denies syncope or pre-syncope.   CURRENT MEDICATIONS:  1. Protonix 40 mg daily.  2. Topamax 100 mg 1 in the morning 2 in the evening.   PHYSICAL EXAM:  She is a well-developed, well-nourished female in no  distress.  Blood pressure is 136/84, pulse 64, weight 162 pounds.  HEENT:  Unremarkable.  CARDIAC:  Normal S1, S2.  Regular rate and rhythm.  LUNGS:  Clear to auscultation.   IMPRESSION:  1. Chest pain, resolved.      a.     Likely secondary to diagnosis #2.  2. Gastroesophageal reflux disease and esophagitis on proton pump      inhibitor therapy.  3. History of left hemorrhagic stroke in 1996.  4. History of melanoma resected in 1999.  5. History of pulmonary embolus status post surgery in 1975.  6. Seizure disorder.  7. Left ovarian cyst.   PLAN:  The patient is doing well from a cardiovascular standpoint.  Her  symptoms are most likely related to her esophagitis.  She has no followup  planned with Dr. Leone [name].  She can follow up with cardiology on a p.r.n.  basis.  Her insurance changes at the end of this month.  She would like to  have a 90 day supply of her Protonix before the insurance changes, and I  have provided her with that prescription.  She wants a copy of all of her  records and we will provide  her with that as well.  She can continue to follow up with Dr. Amador [name].  She can follow up with Dr. Tenny [name] as needed.      Latoya Newcomer, PA-C  Electronically Signed     ______________________________  [name]. Ladona Ridgel, MD   SW/MedQ  DD: 02/23/2006  DT: 02/23/2006  Job #: [id number]

## 2010-11-11 ENCOUNTER — Other Ambulatory Visit: Payer: Self-pay | Admitting: Obstetrics and Gynecology

## 2010-11-11 DIAGNOSIS — Z1231 Encounter for screening mammogram for malignant neoplasm of breast: Secondary | ICD-10-CM

## 2010-12-22 ENCOUNTER — Ambulatory Visit
Admission: RE | Admit: 2010-12-22 | Discharge: 2010-12-22 | Disposition: A | Discharge status: Home / Self Care | Payer: Medicare Other | Source: Ambulatory Visit | Attending: Obstetrics and Gynecology | Admitting: Obstetrics and Gynecology

## 2010-12-22 DIAGNOSIS — Z1231 Encounter for screening mammogram for malignant neoplasm of breast: Secondary | ICD-10-CM

## 2011-04-06 ENCOUNTER — Other Ambulatory Visit (HOSPITAL_COMMUNITY)
Admission: RE | Admit: 2011-04-06 | Discharge: 2011-04-06 | Disposition: A | Discharge status: Home / Self Care | Payer: Medicare Other | Source: Ambulatory Visit | Attending: Obstetrics and Gynecology | Admitting: Obstetrics and Gynecology

## 2011-04-06 ENCOUNTER — Other Ambulatory Visit: Payer: Self-pay | Admitting: Obstetrics and Gynecology

## 2011-04-06 DIAGNOSIS — Z124 Encounter for screening for malignant neoplasm of cervix: Secondary | ICD-10-CM | POA: Insufficient documentation

## 2011-07-28 ENCOUNTER — Encounter (INDEPENDENT_AMBULATORY_CARE_PROVIDER_SITE_OTHER): Payer: Self-pay | Admitting: Surgery

## 2011-07-28 ENCOUNTER — Ambulatory Visit (INDEPENDENT_AMBULATORY_CARE_PROVIDER_SITE_OTHER): Payer: MEDICARE | Admitting: Surgery

## 2011-07-28 VITALS — BP 130/90 | HR 64 | Temp 97.4°F | Resp 16 | Ht 64.0 in | Wt 141.0 lb

## 2011-07-28 DIAGNOSIS — N904 Leukoplakia of vulva: Secondary | ICD-10-CM | POA: Insufficient documentation

## 2011-07-28 DIAGNOSIS — K52831 Collagenous colitis: Secondary | ICD-10-CM

## 2011-07-28 DIAGNOSIS — L940 Localized scleroderma [morphea]: Secondary | ICD-10-CM

## 2011-07-28 DIAGNOSIS — K5289 Other specified noninfective gastroenteritis and colitis: Secondary | ICD-10-CM

## 2011-07-28 DIAGNOSIS — L290 Pruritus ani: Secondary | ICD-10-CM

## 2011-07-28 DIAGNOSIS — K645 Perianal venous thrombosis: Secondary | ICD-10-CM

## 2011-07-28 NOTE — Patient Instructions (Signed)
Stop all creams for now  ANORECTAL SURGERY: POST OP INSTRUCTIONS  1. Take your usually prescribed home medications unless otherwise directed. 2. DIET: Follow a light bland diet the first 24 hours after arrival home, such as soup, liquids, crackers, etc.  Be sure to include lots of fluids daily.  Avoid fast food or heavy meals as your are more likely to get nauseated.  Eat a low fat the next few days after surgery.   3. PAIN CONTROL: a. Pain is best controlled by a usual combination of three different methods TOGETHER: i. Ice/Heat ii. Over the counter pain medication iii. Prescription pain medication b. Most patients will experience some swelling and discomfort in the anus/rectal area. and incisions.  Ice packs or heat (30-60 minutes up to 6 times a day) will help. Use ice for the first few days to help decrease swelling and bruising, then switch to heat such as warm towels, sitz baths, warm baths, etc to help relax tight/sore spots and speed recovery.  Some people prefer to use ice alone, heat alone, alternating between ice & heat.  Experiment to what works for you.  Swelling and bruising can take several weeks to resolve.   c. It is helpful to take an over-the-counter pain medication regularly for the first few weeks.  Choose one of the following that works best for you: i. Naproxen (Aleve, etc)  Two 220mg  tabs twice a day ii. Ibuprofen (Advil, etc) Three 200mg  tabs four times a day (every meal & bedtime) iii. Acetaminophen (Tylenol, etc) 500-650mg  four times a day (every meal & bedtime) d. A  prescription for pain medication (such as oxycodone, hydrocodone, etc) should be given to you upon discharge.  Take your pain medication as prescribed.  i. If you are having problems/concerns with the prescription medicine (does not control pain, nausea, vomiting, rash, itching, etc), please call us [phone] to see if we need to switch you to a different pain medicine that will work better for you  and/or control your side effect better. ii. If you need a refill on your pain medication, please contact your pharmacy.  They will contact our office to request authorization. Prescriptions will not be filled after 5 pm or on week-ends. 4. KEEP YOUR BOWELS REGULAR a. The goal is one bowel movement a day b. Avoid getting constipated.  Between the surgery and the pain medications, it is common to experience some constipation.  Increasing fluid intake and taking a fiber supplement (such as Metamucil, Citrucel, FiberCon, MiraLax, etc) 1-2 times a day regularly will usually help prevent this problem from occurring.  A mild laxative (prune juice, Milk of Magnesia, MiraLax, etc) should be taken according to package directions if there are no bowel movements after 48 hours. c. Watch out for diarrhea.  If you have many loose bowel movements, simplify your diet to bland foods & liquids for a few days.  Stop any stool softeners and decrease your fiber supplement.  Switching to mild anti-diarrheal medications (Kayopectate, Pepto Bismol) can help.  If this worsens or does not improve, please call us.  5. Wound Care a. Remove your bandages the day after surgery.  Unless discharge instructions indicate otherwise, leave your bandage dry and in place overnight.  Remove the bandage during your first bowel movement.   b. Allow the wound packing to fall out over the next few days.  You can trim exposed gauze / ribbon as it falls out.  You do not need to repack the wound  unless instructed otherwise.  Wear an absorbent pad or soft cotton gauze in your underwear as needed to catch any drainage and help keep the area  c. Keep the area clean and dry.  Bathe / shower every day.  Keep the area clean by showering / bathing over the incision / wound.   It is okay to soak an open wound to help wash it.  Wet wipes or showers / gentle washing after bowel movements is often less traumatic than regular toilet paper. d. Bonita Quin may have some  styrofoam-like soft packing in the rectum which will come out with the first bowel movement.  e. You will often notice bleeding with bowel movements.  This should slow down by the end of the first week of surgery f. Expect some drainage.  This should slow down, too, by the end of the first week of surgery.  Wear an absorbent pad or soft cotton gauze in your underwear until the drainage stops. 6. ACTIVITIES as tolerated:   a. You may resume regular (light) daily activities beginning the next day--such as daily self-care, walking, climbing stairs--gradually increasing activities as tolerated.  If you can walk 30 minutes without difficulty, it is safe to try more intense activity such as jogging, treadmill, bicycling, low-impact aerobics, swimming, etc. b. Save the most intensive and strenuous activity for last such as sit-ups, heavy lifting, contact sports, etc  Refrain from any heavy lifting or straining until you are off narcotics for pain control.   c. DO NOT PUSH THROUGH PAIN.  Let pain be your guide: If it hurts to do something, don't do it.  Pain is your body warning you to avoid that activity for another week until the pain goes down. d. You may drive when you are no longer taking prescription pain medication, you can comfortably sit for long periods of time, and you can safely maneuver your car and apply brakes. e. Bonita Quin may have sexual intercourse when it is comfortable.  7. FOLLOW UP in our office a. Please call [facility] at [phone] to set up an appointment to see your surgeon in the office for a follow-up appointment approximately 2 weeks after your surgery. b. Make sure that you call for this appointment the day you arrive home to insure a convenient appointment time. 10. IF YOU HAVE DISABILITY OR FAMILY LEAVE FORMS, BRING THEM TO THE OFFICE FOR PROCESSING.  DO NOT GIVE THEM TO YOUR DOCTOR.        WHEN TO CALL us [phone]: 1. Poor pain control 2. Reactions / problems with new  medications (rash/itching, nausea, etc)  3. Fever over 101.5 F (38.5 C) 4. Inability to urinate 5. Nausea and/or vomiting 6. Worsening swelling or bruising 7. Continued bleeding from incision. 8. Increased pain, redness, or drainage from the incision  The clinic staff is available to answer your questions during regular business hours (8:30am-5pm).  Please don't hesitate to call and ask to speak to one of our nurses for clinical concerns.   A surgeon from [facility] is always on call at the hospitals   If you have a medical emergency, go to the nearest emergency room or call 911.    [facility] [address], [place], [place]  [address] ? MAIN: [phone]) [phone] ? TOLL FREE: [phone] ? FAX [phone] [email or web address]    HEMORRHOIDS   The rectum is the last few inches of your colon, and it naturally stretches to hold stool.  Hemorrhoidal piles are natural clusters of blood vessels that help the rectum stretch to hold stool and allow bowel movements to eliminate feces.  Hemorrhoids are abnormally swollen blood vessels in the rectum.  Too much pressure in the rectum causes hemorrhoids by forcing blood to stretch and bulge the walls of the veins, sometimes even rupturing them.  Hemorrhoids can become like varicose veins you might see on a person's legs. When bulging hemorrhoidal veins are irritated, they can swell, burn, itch, become very painful, and bleed. Once the rectal veins have been stretched out and hemorrhoids created, they are difficult to get rid of completely and tend to recur with less straining than it took to cause them in the first place. Fortunately, good habits and simple medical treatment usually control hemorrhoids well, and surgery is only recommended in unusually severe cases. Some of the most frequent causes of hemorrhoids:    Constant sitting    Straining with bowel movements (from constipation or hard  stools)    Diarrhea    Sitting on the toilet for a long time    Severe coughing    Childbirth    Heavy Lifting  Types of Hemorrhoids:    Internal hemorrhoids usually don't hurt or itch; they are deep inside the rectum and usually have no sensation. However, internal hemorrhoids can bleed.  Such bleeding should not be ignored and mask blood from a dangerous source like colorectal cancer, so persistent rectal bleeding should be investigated with a colonoscopy.    External hemorrhoids cause most of the symptoms - pain, burning, and itching. Unirritated hemorrhoids can look like small skin tags coming out of the anus.     Thrombosed hemorrhoids can form when a hemorrhoid blood vessel bursts and causes the hemorrhoid to swell.  A purple blood clot can form in it and become an excruciatingly painful lump at the anus. Because of these unpleasant symptoms, immediate incision and drainage by a surgeon at an office visit can provide much relief of the pain.    PREVENTION Avoiding the causes listed in above will prevent most cases of hemorrhoids, but this advice is sometimes hard to follow:  How can you avoid sitting all day if you have a seated job? Also, we try to avoid coughing and diarrhea, but sometimes it's beyond your control.  Still, there are some practical hints to help:    If your main job activity is seated, always stand or walk during your breaks. Make it a point to stand and walk at least 5 minutes every hour and try to shift frequently in your chair to avoid direct rectal pressure.    Always exhale as you strain or lift. Don't hold your breath.    Treat coughing, diarrhea and constipation early since irritated hemorrhoids may soon follow.    Do not delay or try to prevent a bowel movement when the urge is present.   Exercise regularly (walking or jogging 60 minutes a day) to stimulate the bowels to move.   Avoid dry toilet paper when cleaning after bowel movements.  Moistened tissues such as  baby wipes are less irritating.  Lightly pat the rectal area dry.  Using irrigating showers or bottle irrigation washing can more gently clean this sensitive area.   Keep the anal and genital area clean and  dry.  Talcum or baby powders can help   GET YOUR STOOLS SOFT.   This is the most important way to prevent irritated hemorrhoids.  Hard stools are like  sandpaper to the anorectal canal and will cause more problems.   The goal: ONE SOFT BOWEL MOVEMENT A DAY!  To have soft, regular bowel movements:    Drink at least 8 tall glasses of water a day.     AVOID CONSTIPATION    Take plenty of fiber.  Fiber is the undigested part of plant food that passes into the colon, acting s "natures broom" to encourage bowel motility and movement.  Fiber can absorb and hold large amounts of water. This results in a larger, bulkier stool, which is soft and easier to pass. Work gradually over several weeks up to 6 servings a day of fiber (25g a day even more if needed) in the form of: o Vegetables -- Root (potatoes, carrots, turnips), leafy green (lettuce, salad greens, celery, spinach), or cooked high residue (cabbage, broccoli, etc) o Fruit -- Fresh (unpeeled skin & pulp), Dried (prunes, apricots, cherries, etc ),  or stewed ( applesauce)  o Whole grain breads, pasta, etc (whole wheat)  o Bran cereals    Bulking Agents -- This type of water-retaining fiber generally is easily obtained each day by one of the following:  o Psyllium bran -- The psyllium plant is remarkable because its ground seeds can retain so much water. This product is available as Metamucil, Konsyl, Effersyllium, Per Diem Fiber, or the less expensive generic preparation in drug and health food stores. Although labeled a laxative, it really is not a laxative.  o Methylcellulose -- This is another fiber derived from wood which also retains water. It is available as Citrucel. o Polyethylene Glycol - and "artificial" fiber commonly called Miralax or  Glycolax.  It is helpful for people with gassy or bloated feelings with regular fiber o Flax Seed - a less gassy fiber than psyllium   No reading or other relaxing activity while on the toilet. If bowel movements take longer than 5 minutes, you are too constipated   Laxatives can be useful for a short period if constipation is severe o Osmotics (Milk of Magnesia, Fleets phosphosoda, Magnesium citrate, MiraLax, GoLytely) are safer than  o Stimulants (Senokot, Castor Oil, Dulcolax, Ex Lax)    o Do not take laxatives for more than 7days in a row.   Laxatives are not a good long-term solution as it can stress the intestine and colon and causes too much mineral and fluid losses.    If badly constipated, try a Bowel Retraining Program: o Do not use laxatives.  o Eat a diet high in roughage, such as bran cereals and leafy vegetables.  o Drink six (6) ounces of prune or apricot juice each morning.  o Eat two (2) large servings of stewed fruit each day.  o Take one (1) heaping dose of a bulking agent (ex. Metamucil, Citrucel, Miralax) twice a day.  o Use sugar-free sweetener when possible to avoid excessive calories.  o Eat a normal breakfast.  o Set aside 15 minutes after breakfast to sit on the toilet, but do not strain to have a bowel movement.  o If you do not have a bowel movement by the third day, use an enema and repeat the above steps.    AVOID DIARRHEA o Switch to liquids and simpler foods for a few days to avoid stressing your intestines further. o Avoid dairy products (especially milk & ice cream) for a short time.  The intestines often can lose the ability to digest lactose when stressed. o Avoid foods that cause gassiness or  bloating.  Typical foods include beans and other legumes, cabbage, broccoli, and dairy foods.  Every person has some sensitivity to other foods, so listen to our body and avoid those foods that trigger problems for you. o Adding fiber (Citrucel, Metamucil, psyllium,  Miralax) gradually can help thicken stools by absorbing excess fluid and retrain the intestines to act more normally.  Slowly increase the dose over a few weeks.  Too much fiber too soon can backfire and cause cramping & bloating. o Probiotics (such as active yogurt, Align, etc) may help repopulate the intestines and colon with normal bacteria and calm down a sensitive digestive tract.  Most studies show it to be of mild help, though, and such products can be costly. o Medicines:   Bismuth subsalicylate (ex. Kayopectate, Pepto Bismol) every 30 minutes for up to 6 doses can help control diarrhea.  Avoid if pregnant.   Loperamide (Immodium) can slow down diarrhea.  Start with two tablets (4mg  total) first and then try one tablet every 6 hours.  Avoid if you are having fevers or severe pain.  If you are not better or start feeling worse, stop all medicines and call your doctor for advice o Call your doctor if you are getting worse or not better.  Sometimes further testing (cultures, endoscopy, X-ray studies, bloodwork, etc) may be needed to help diagnose and treat the cause of the diarrhea.   If these preventive measures fail, you must take action right away! Hemorrhoids are one condition that can be mild in the morning and become intolerable by nightfall.

## 2011-07-29 NOTE — Progress Notes (Signed)
Subjective:     Patient ID: [name], female   DOB: September 29, 1940, 71 y.o.   MRN: [id number]  HPI  [name]  31-Oct-1940 [id number]  Patient Care Team: Lillia Mountain, MD as PCP - General (Internal Medicine) Willis Modena, MD as Consulting Physician (Gastroenterology) Dorien Chihuahua. Richardson Dopp, MD as Consulting Physician (Obstetrics and Gynecology)  This patient is a 71 y.o.female who presents today for surgical evaluation at the request of Dr. Dulce Sellar.   Reason for visit: Anal pain and probable thrombosed hemorrhoid.   History present illness:   Ms. Soave is a pleasant but anxious female who struggled with perianal pain for some time. She did have  bad diarrhea. 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 She recalls being on steroid pills for a few months and then weaned off. That improved. Then she became very constipated. She wonders if she has irritable bowel as well. She does not recall any prior hemorrhoidal problems. She's had colonoscopies in the past. Most recently last year. No polyps or other surprises outside of the collagenous colitis would seem to resolve.  She felt a pain and a lump a few weeks ago. Occasional bright red blood when she wipes. No large drops in the toilet. Not within the stool itself.. She gets some burning and itching with bowel movements. She tried some Preparation H it helped in the short-term. She's tried some warm soaks. It helps in the short-term. She saw her primary care physician. Was given a trial of rectiv/nitroglycerin. It did not help. Then she saw Dr. Richardson Dopp, her gynecologist. She was started on Proctosol. That did not help. She does have lichen sclerosis and uses a steroid creams intermittently for her vulva. Not recently.   She saw her gastroenterologist. Dr. Dulce Sellar switched her over to diltiazem/lidocaine cream. That seemed to help a little. He strongly recommended that she improve her compliance with a bowel regimen including Dulcolax, Metamucil, and  MiraLAX. Finally her, her constipation improved and now she's having 2 soft bowel movements a day.   He sent her to [place] to see if she required surgery.   She's held her aspirin with the bleeding. She and her husband are on a high fiber diet. She's intentionally lost about 30 pounds in the past year. She often feels like she cannot evacuate completely.  She is anxious & frustrated.   She says I am the the fourth doctor to help her with this.  Patient Active Problem List  Diagnoses  . ALLERGIC RHINITIS  . SEIZURE DISORDER  . CEREBROVASCULAR ACCIDENT, HX OF  . PULMONARY EMBOLISM, HX OF  . Internal thrombosed hemorrhoid, right posterior  . Pruritus ani  . Collagenous colitis  . Lichen sclerosus et atrophicus of the vulva    Past Medical History  Diagnosis Date  . Melanoma   . Seizures   . Stroke     Past Surgical History  Procedure Date  . Tonsillectomy 1954  . Appendectomy 1955  . Varicose vein surgery 1969    right leg  . Dilation and curettage of uterus 1972  . Tubal ligation 1975  . Breast enhancement surgery 1975  . Melanoma excision 07/1997    left shoulder  . Cataract extraction 09/2004    left eye  . Cataract extraction 11/2007    right eye    History   Social History  . Marital Status: Married    Spouse Name: N/A    Number of Children: N/A  . Years of Education: N/A  Occupational History  . Not on file.   Social History Main Topics  . Smoking status: Former Smoker    Quit date: 09/26/1989  . Smokeless tobacco: Not on file  . Alcohol Use: Yes     2 glasses wine pm  . Drug Use: No  . Sexually Active: Not on file   Other Topics Concern  . Not on file   Social History Narrative  . No narrative on file    Family History  Problem Relation Age of Onset  . Cancer Maternal Grandmother     esophageal  . Heart attack Father   . Diabetes Father     Current Outpatient Prescriptions  Medication Sig Dispense Refill  . AMBULATORY NON FORMULARY MEDICATION  Medication Name: diltiazem 2%      . bisacodyl (BISACODYL) 5 MG EC tablet Take 5 mg by mouth daily as needed.      . Cholecalciferol (VITAMIN D) 2000 UNITS CAPS Take 2,000 Units by mouth daily.      . psyllium (METAMUCIL) 58.6 % packet Take 1 packet by mouth daily.      Marland Kitchen topiramate (TOPAMAX) 200 MG tablet Take 200 mg by mouth 2 (two) times daily. Half tablet      . triamcinolone ointment (KENALOG) 0.5 % Apply topically 2 (two) times daily.      Marland Kitchen zolpidem (AMBIEN) 10 MG tablet Take 10 mg by mouth at bedtime as needed.         Allergies  Allergen Reactions  . Moxifloxacin     Redness, non-healing  . Latex Rash  . Tape Rash    BP 130/90  Pulse 64  Temp(Src) 97.4 F (36.3 C) (Temporal)  Resp 16  Ht 5\' 4"  (1.626 m)  Wt 141 lb (63.957 kg)  BMI 24.20 kg/m2     Review of Systems  Constitutional: Negative for fever, chills, diaphoresis, appetite change and fatigue.  HENT: Negative for ear pain, sore throat, trouble swallowing, neck pain and ear discharge.   Eyes: Negative for photophobia, discharge and visual disturbance.  Respiratory: Negative for cough, choking, chest tightness and shortness of breath.   Cardiovascular: Negative for chest pain and palpitations.       Patient walks 60 minutes for about 1 miles without difficulty.  No exertional chest/neck/shoulder/arm pain.   Gastrointestinal: Positive for nausea, constipation, anal bleeding and rectal pain. Negative for vomiting, abdominal pain and diarrhea.       No personal nor family history of GI/colon cancer, , allergy such as Celiac Sprue, dietary/dairy problems, colitis, ulcers nor gastritis.    + collagenous colitis but not inflammatory bowel disease, ?+ irritable bowel syndrome  No recent sick contacts/gastroenteritis.  No travel outside the country.  No changes in diet.    Genitourinary: Negative for dysuria, frequency and difficulty urinating.  Musculoskeletal: Negative for myalgias and gait problem.  Skin:  Negative for color change, pallor and rash.  Neurological: Negative for dizziness, speech difficulty, weakness and numbness.  Hematological: Negative for adenopathy.  Psychiatric/Behavioral: Negative for confusion and agitation. The patient is not nervous/anxious.        Objective:   Physical Exam  Constitutional: She is oriented to person, place, and time. She appears well-developed and well-nourished. No distress.  HENT:  Head: Normocephalic.  Mouth/Throat: Oropharynx is clear and moist. No oropharyngeal exudate.  Eyes: Conjunctivae and EOM are normal. Pupils are equal, round, and reactive to light. No scleral icterus.  Neck: Normal range of motion. Neck supple. No tracheal deviation present.  Cardiovascular: Normal rate, regular rhythm and intact distal pulses.   Pulmonary/Chest: Effort normal and breath sounds normal. No respiratory distress. She exhibits no tenderness.  Abdominal: Soft. She exhibits no distension and no mass. There is no tenderness. Hernia confirmed negative in the right inguinal area and confirmed negative in the left inguinal area.  Genitourinary: No vaginal discharge found.       Perianal skin clean with good hygiene.  No external skin tags / hemorrhoids of significance.  No pilonidal disease.  No fissure.  No abscess/fistula.    Perianal skin rather thinned out and tears easily. No true posterior midline fissure.  Right anterior thrombosed hemorrhoid. 1 cm in size. Somewhat tender.  She doesn't tolerate digital rectal exam. But sensitive. Right anterior internal hemorrhoidal pile inflamed. Right posterior and left lateral not. No other rectal masses. Normal sphincter tone.    Musculoskeletal: Normal range of motion. She exhibits no tenderness.  Lymphadenopathy:    She has no cervical adenopathy.       Right: No inguinal adenopathy present.       Left: No inguinal adenopathy present.  Neurological: She is alert and oriented to person, place, and time. No cranial  nerve deficit. She exhibits normal muscle tone. Coordination normal.  Skin: Skin is warm and dry. No rash noted. She is not diaphoretic. No erythema.  Psychiatric: Her speech is normal and behavior is normal. Judgment and thought content normal. Her mood appears anxious. Her affect is not inappropriate.       But consolable.  Pleasant       Assessment:     Thrombosed hemorrhoid. 2 weeks out. Resolving but still uncomfortable.  Perianal thinning of skin with superficial tears. Question of thinning out by steroid cream/pills. No true fissure    Plan:      The anatomy & physiology of the anorectal region was discussed.  The pathophysiology of hemorrhoids and differential diagnosis was discussed.  Natural history progression  of worsening swelling with eventual resolution over weeks to months was discussed.   I stressed the importance of a bowel regimen to have daily soft bowel movements to minimize progression of disease.     The patient's symptoms are not adequately controlled.  Therefore, I recommended incision to drain the thrombosed hemorrhoid..  I went over the technique, risks, benefits, and alternatives.   I spent a large amount of time the patient and her husband tried to explain all her issues. I offered the option of lung thrombosed hemorrhoid resolve on its own versus lancing it. She was very nervous about having it lanced. However, she did not want to wait and more weeks for this to gradually go away with the possibility could flare back up.   Questions were answered.  The patient expressed understanding & wished to proceed.  The patient was positioned in the lateral decubitus position.  I placed a field block of local anaesthetic.  Perianal & rectal examination was done.  I incised & decompressed the right anterior thrombosed hemorrhoid.  The patient tolerated the procedure well.   Once the anal block rectal block was set. I was able to do a better digital exam. Her pain had gone  markedly down. No fissure or fistula otherwise.  I recommended she stop all creams and salves. Hold off on any steroids or avoid around the perianal region. I wonder if that is contributing to the sensitivity and thinning of that region. Continue the bowel regimen. Perhaps up Metamucil and weaned off Dulcolax  to simplify the regimen from 3 agents to 2 or ideally one. However do this gradually should she does not have any rebound constipation which will make think start all over again. I strongly recommend she do warm soaks 4-6 times a day. Switch to wet wipes. If pain returns, start the diltiazem cream as that does not have steroids seem to be the one that worked the best. I would like to see her in a few weeks to make sure she is improving. Call me if not.  Educational handouts further explaining the pathology, treatment options, and bowel regimen were given as well. She and her husband expressed understanding and appreciation

## 2011-08-17 ENCOUNTER — Encounter (INDEPENDENT_AMBULATORY_CARE_PROVIDER_SITE_OTHER): Payer: MEDICARE | Admitting: Surgery

## 2011-09-08 ENCOUNTER — Ambulatory Visit: Payer: Self-pay | Admitting: Neurology

## 2011-11-24 ENCOUNTER — Other Ambulatory Visit: Payer: Self-pay | Admitting: Obstetrics and Gynecology

## 2011-11-24 DIAGNOSIS — Z1231 Encounter for screening mammogram for malignant neoplasm of breast: Secondary | ICD-10-CM

## 2011-12-23 ENCOUNTER — Ambulatory Visit
Admission: RE | Admit: 2011-12-23 | Discharge: 2011-12-23 | Disposition: A | Discharge status: Home / Self Care | Payer: Medicare Other | Source: Ambulatory Visit | Attending: Obstetrics and Gynecology | Admitting: Obstetrics and Gynecology

## 2011-12-23 DIAGNOSIS — Z1231 Encounter for screening mammogram for malignant neoplasm of breast: Secondary | ICD-10-CM

## 2012-07-18 ENCOUNTER — Ambulatory Visit: Payer: Self-pay | Admitting: Internal Medicine

## 2012-10-23 ENCOUNTER — Other Ambulatory Visit (HOSPITAL_COMMUNITY)
Admission: RE | Admit: 2012-10-23 | Discharge: 2012-10-23 | Disposition: A | Discharge status: Home / Self Care | Payer: Medicare Other | Source: Ambulatory Visit | Attending: Obstetrics and Gynecology | Admitting: Obstetrics and Gynecology

## 2012-10-23 ENCOUNTER — Other Ambulatory Visit: Payer: Self-pay | Admitting: Obstetrics and Gynecology

## 2012-10-23 DIAGNOSIS — Z1151 Encounter for screening for human papillomavirus (HPV): Secondary | ICD-10-CM | POA: Insufficient documentation

## 2012-10-23 DIAGNOSIS — Z01419 Encounter for gynecological examination (general) (routine) without abnormal findings: Secondary | ICD-10-CM | POA: Insufficient documentation

## 2012-11-26 ENCOUNTER — Other Ambulatory Visit: Payer: Self-pay

## 2012-11-26 DIAGNOSIS — Z1231 Encounter for screening mammogram for malignant neoplasm of breast: Secondary | ICD-10-CM

## 2012-12-25 ENCOUNTER — Ambulatory Visit
Admission: RE | Admit: 2012-12-25 | Discharge: 2012-12-25 | Disposition: A | Discharge status: Home / Self Care | Payer: Medicare Other | Source: Ambulatory Visit

## 2012-12-25 DIAGNOSIS — Z1231 Encounter for screening mammogram for malignant neoplasm of breast: Secondary | ICD-10-CM

## 2013-10-29 ENCOUNTER — Other Ambulatory Visit: Payer: Self-pay | Admitting: Obstetrics and Gynecology

## 2013-10-29 DIAGNOSIS — N644 Mastodynia: Secondary | ICD-10-CM

## 2013-11-05 ENCOUNTER — Other Ambulatory Visit: Payer: Medicare Other

## 2013-11-06 ENCOUNTER — Ambulatory Visit
Admission: RE | Admit: 2013-11-06 | Discharge: 2013-11-06 | Disposition: A | Discharge status: Home / Self Care | Payer: Medicare Other | Source: Ambulatory Visit | Attending: Obstetrics and Gynecology | Admitting: Obstetrics and Gynecology

## 2013-11-06 DIAGNOSIS — N644 Mastodynia: Secondary | ICD-10-CM

## 2013-12-02 ENCOUNTER — Other Ambulatory Visit: Payer: Self-pay

## 2013-12-02 DIAGNOSIS — Z1231 Encounter for screening mammogram for malignant neoplasm of breast: Secondary | ICD-10-CM

## 2014-01-14 ENCOUNTER — Ambulatory Visit
Admission: RE | Admit: 2014-01-14 | Discharge: 2014-01-14 | Disposition: A | Discharge status: Home / Self Care | Payer: Medicare Other | Source: Ambulatory Visit

## 2014-01-14 DIAGNOSIS — Z1231 Encounter for screening mammogram for malignant neoplasm of breast: Secondary | ICD-10-CM

## 2014-10-13 ENCOUNTER — Ambulatory Visit
Admission: RE | Admit: 2014-10-13 | Discharge: 2014-10-13 | Disposition: A | Discharge status: Home / Self Care | Payer: Medicare Other | Source: Ambulatory Visit | Attending: Internal Medicine | Admitting: Internal Medicine

## 2014-10-13 ENCOUNTER — Other Ambulatory Visit: Payer: Self-pay | Admitting: Internal Medicine

## 2014-10-13 DIAGNOSIS — M25572 Pain in left ankle and joints of left foot: Secondary | ICD-10-CM

## 2014-11-07 ENCOUNTER — Ambulatory Visit (INDEPENDENT_AMBULATORY_CARE_PROVIDER_SITE_OTHER): Payer: Medicare Other | Admitting: Neurology

## 2014-11-07 ENCOUNTER — Encounter: Payer: Self-pay | Admitting: Neurology

## 2014-11-07 VITALS — BP 140/73 | HR 62 | Ht 63.0 in | Wt 160.0 lb

## 2014-11-07 DIAGNOSIS — R569 Unspecified convulsions: Secondary | ICD-10-CM

## 2014-11-07 HISTORY — DX: Unspecified convulsions: R56.9

## 2014-11-07 MED ORDER — TOPIRAMATE 200 MG PO TABS: ORAL_TABLET | ORAL | Status: DC

## 2014-11-07 NOTE — Patient Instructions (Signed)

## 2014-11-07 NOTE — Progress Notes (Signed)
Reason for visit: Seizures  Referring physician: Dr. Burr Medico is a 74 y.o. 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 Within 6 months following the stroke, she began having focal seizures associated with transient aphasia lasting 5-10 minutes. The patient would not have any motor or sensory component to the seizure. She has been treated with Topamax since 2000. She has done well with the Topamax, she has not had any recurrent seizures since 01/10/2009. She is on 300 mg daily of Topamax, she is tolerating the medication well. She had been seen through this office previously, last seen in April 2011. The patient lives in [place], [place], and comes back here for management of her seizures. She is concerned about frequent generic changes in her medication, she is concerned that this may impact her seizure control. She does operate a Teacher, music. She indicates that when she has a seizure, she does not get confused, there is no weakness or visual loss. The patient has some mild residual from the stroke with difficulty with reading, but overall she functions fairly well with language.  Past Medical History  Diagnosis Date  . Melanoma   . Seizures   . Stroke   . PE (pulmonary embolism)   . Osteopenia   . Vitamin D deficiency   . Melanoma   . Lichen sclerosus   . Colitis, Clostridium difficile   . Bilateral ovarian cysts   . IBS (irritable bowel syndrome)   . Herpes   . Focal seizure 11/07/2014    aphasia    Past Surgical History  Procedure Laterality Date  . Tonsillectomy  1954  . Appendectomy  1955  . Varicose vein surgery  1969    right leg  . Dilation and curettage of uterus  1972  . Tubal ligation  1975  . Breast enhancement surgery  1975  . Melanoma excision   07/1997    left shoulder  . Cataract extraction  09/2004    left eye  . Cataract extraction  11/2007    right eye    Family History  Problem Relation Age of Onset  . Cancer Maternal Grandmother     esophageal  . Heart attack Father   . Diabetes Father   . Atrial fibrillation Father   . Heart attack Mother     Social history:  reports that she quit smoking about 25 years ago. She does not have any smokeless tobacco history on file. She reports that she drinks about 8.4 oz of alcohol per week. She reports that she does not use illicit drugs.  Medications:  Prior to Admission medications   Medication Sig Start Date End Date Taking? Authorizing Provider  Cholecalciferol (VITAMIN D) 2000 UNITS CAPS Take 2,000 Units by mouth daily.   Yes Historical Provider, MD  clobetasol cream (TEMOVATE) 7.25 % Apply 1 application topically 2 (two) times daily.   Yes Historical Provider, MD  topiramate (TOPAMAX) 200 MG tablet Take 200 mg by mouth 2 (two) times daily. 1/2 tablet in the am; 1 tablet in the pm   Yes Historical Provider, MD  valACYclovir (VALTREX) 500 MG tablet Take 500 mg by mouth daily.   Yes Historical Provider, MD  zolpidem (AMBIEN) 10 MG tablet Take 10 mg by mouth at bedtime as needed.   Yes  Historical Provider, MD      Allergies  Allergen Reactions  . Moxifloxacin     Redness, non-healing  . Latex Rash  . Tape Rash    ROS:  Out of a complete 14 system review of symptoms, the patient complains only of the following symptoms, and all other reviewed systems are negative.  Feeling cold Runny nose  Blood pressure 140/73, pulse 62, height 5\' 3"  (1.6 m), weight 160 lb (72.576 kg).  Physical Exam  General: The patient is alert and cooperative at the time of the examination.  Eyes: Pupils are equal, round, and reactive to light. Discs are flat bilaterally.  Neck: The neck is supple, no carotid bruits are noted.  Respiratory: The respiratory examination is  clear.  Cardiovascular: The cardiovascular examination reveals a regular rate and rhythm, no obvious murmurs or rubs are noted.  Skin: Extremities are without significant edema.  Neurologic Exam  Mental status: The patient is alert and oriented x 3 at the time of the examination. The patient has apparent normal recent and remote memory, with an apparently normal attention span and concentration ability.  Cranial nerves: Facial symmetry is present. There is good sensation of the face to pinprick and soft touch bilaterally. The strength of the facial muscles and the muscles to head turning and shoulder shrug are normal bilaterally. Speech is well enunciated, no aphasia or dysarthria is noted. Extraocular movements are full. Visual fields are full. The tongue is midline, and the patient has symmetric elevation of the soft palate. No obvious hearing deficits are noted.  Motor: The motor testing reveals 5 over 5 strength of all 4 extremities. Good symmetric motor tone is noted throughout.  Sensory: Sensory testing is intact to pinprick, soft touch, vibration sensation, and position sense on all 4 extremities. No evidence of extinction is noted.  Coordination: Cerebellar testing reveals good finger-nose-finger and heel-to-shin bilaterally.  Gait and station: Gait is normal. Tandem gait is normal. Romberg is negative. No drift is seen.  Reflexes: Deep tendon reflexes are symmetric and normal bilaterally. Toes are downgoing bilaterally.   Assessment/Plan:  1. Focal seizures, aphasia  2. History of hemorrhagic stroke, left brain  The patient doing quite well on the Topamax. She indicates that she was placed on a multitude of other anticonvulsives before Topamax was started. This was the only medication she could tolerate. The patient indicates that she is being switched frequently from different generics. We will request a single generic manufacturer, [employer]. A 90 day supply was given of the  Topamax, the patient will follow-up in one year, or sooner if needed.  Jill Alexanders MD 11/07/2014 3:22 PM  [facility] [address] [place], [place] [address]  Phone [phone] Fax [phone]

## 2014-12-02 ENCOUNTER — Other Ambulatory Visit: Payer: Self-pay

## 2014-12-02 DIAGNOSIS — Z1231 Encounter for screening mammogram for malignant neoplasm of breast: Secondary | ICD-10-CM

## 2015-01-20 ENCOUNTER — Ambulatory Visit
Admission: RE | Admit: 2015-01-20 | Discharge: 2015-01-20 | Disposition: A | Discharge status: Home / Self Care | Payer: Medicare Other | Source: Ambulatory Visit

## 2015-01-20 DIAGNOSIS — Z1231 Encounter for screening mammogram for malignant neoplasm of breast: Secondary | ICD-10-CM

## 2015-04-21 DIAGNOSIS — M79651 Pain in right thigh: Secondary | ICD-10-CM | POA: Diagnosis not present

## 2015-04-21 DIAGNOSIS — M25511 Pain in right shoulder: Secondary | ICD-10-CM | POA: Diagnosis not present

## 2015-04-21 DIAGNOSIS — Z0000 Encounter for general adult medical examination without abnormal findings: Secondary | ICD-10-CM | POA: Diagnosis not present

## 2015-04-21 DIAGNOSIS — Z1389 Encounter for screening for other disorder: Secondary | ICD-10-CM | POA: Diagnosis not present

## 2015-05-12 DIAGNOSIS — M25511 Pain in right shoulder: Secondary | ICD-10-CM | POA: Diagnosis not present

## 2015-05-12 DIAGNOSIS — M25551 Pain in right hip: Secondary | ICD-10-CM | POA: Diagnosis not present

## 2015-05-21 DIAGNOSIS — M25551 Pain in right hip: Secondary | ICD-10-CM | POA: Diagnosis not present

## 2015-05-27 DIAGNOSIS — B009 Herpesviral infection, unspecified: Secondary | ICD-10-CM | POA: Diagnosis not present

## 2015-05-27 DIAGNOSIS — N760 Acute vaginitis: Secondary | ICD-10-CM | POA: Diagnosis not present

## 2015-05-27 DIAGNOSIS — R102 Pelvic and perineal pain: Secondary | ICD-10-CM | POA: Diagnosis not present

## 2015-05-28 DIAGNOSIS — M7061 Trochanteric bursitis, right hip: Secondary | ICD-10-CM | POA: Diagnosis not present

## 2015-05-28 DIAGNOSIS — M25551 Pain in right hip: Secondary | ICD-10-CM | POA: Diagnosis not present

## 2015-06-08 DIAGNOSIS — R102 Pelvic and perineal pain: Secondary | ICD-10-CM | POA: Diagnosis not present

## 2015-06-25 DIAGNOSIS — H02831 Dermatochalasis of right upper eyelid: Secondary | ICD-10-CM | POA: Diagnosis not present

## 2015-06-25 DIAGNOSIS — M7061 Trochanteric bursitis, right hip: Secondary | ICD-10-CM | POA: Diagnosis not present

## 2015-08-04 ENCOUNTER — Ambulatory Visit: Admit: 2015-08-04 | Payer: Self-pay | Admitting: Ophthalmology

## 2015-08-04 SURGERY — BLEPHAROPLASTY
Anesthesia: IV Sedation (MBSC Only) | Laterality: Bilateral

## 2015-09-25 DIAGNOSIS — L821 Other seborrheic keratosis: Secondary | ICD-10-CM | POA: Diagnosis not present

## 2015-09-25 DIAGNOSIS — D225 Melanocytic nevi of trunk: Secondary | ICD-10-CM | POA: Diagnosis not present

## 2015-09-25 DIAGNOSIS — D2261 Melanocytic nevi of right upper limb, including shoulder: Secondary | ICD-10-CM | POA: Diagnosis not present

## 2015-09-25 DIAGNOSIS — Z85820 Personal history of malignant melanoma of skin: Secondary | ICD-10-CM | POA: Diagnosis not present

## 2015-10-25 ENCOUNTER — Other Ambulatory Visit: Payer: Self-pay | Admitting: Neurology

## 2015-11-05 DIAGNOSIS — N907 Vulvar cyst: Secondary | ICD-10-CM | POA: Diagnosis not present

## 2015-11-05 DIAGNOSIS — L900 Lichen sclerosus et atrophicus: Secondary | ICD-10-CM | POA: Diagnosis not present

## 2015-11-05 DIAGNOSIS — Z01419 Encounter for gynecological examination (general) (routine) without abnormal findings: Secondary | ICD-10-CM | POA: Diagnosis not present

## 2015-11-05 DIAGNOSIS — B009 Herpesviral infection, unspecified: Secondary | ICD-10-CM | POA: Diagnosis not present

## 2015-11-09 ENCOUNTER — Ambulatory Visit (INDEPENDENT_AMBULATORY_CARE_PROVIDER_SITE_OTHER): Payer: PPO | Admitting: Neurology

## 2015-11-09 ENCOUNTER — Encounter: Payer: Self-pay | Admitting: Neurology

## 2015-11-09 VITALS — BP 134/72 | HR 61 | Ht 63.0 in | Wt 162.0 lb

## 2015-11-09 DIAGNOSIS — R569 Unspecified convulsions: Secondary | ICD-10-CM

## 2015-11-09 NOTE — Progress Notes (Signed)
Reason for visit:  seizures  [name] is an 75 y.o. female  History of present illness:   [name] is a 75 year old right-handed white female with a history of a left hemispheric hemorrhagic stroke in 1996. The patient has had seizures associated with transient aphasia lasting 5-10 minutes where she is unable to speak well and cannot understand language. The patient has done well on Topamax, she will have a very occasional brief event of difficulty with reading the newspaper in the morning , this event has only occurred twice in the last year. The patient is on Topamax and she is tolerating the medication well. The patient has not been able to tolerate multiple other anticonvulsant medications. She has not had any overt transient aphasia episodes since last seen one year ago. She returns for an evaluation. She has had some issues with right arm and leg pain thought secondary to bursitis. She has been seen through orthopedic surgery for this.  Past Medical History:  Diagnosis Date  . Bilateral ovarian cysts   . Colitis, Clostridium difficile   . Focal seizure ([place]) 11/07/2014   aphasia  . Herpes   . IBS (irritable bowel syndrome)   . Lichen sclerosus   . Melanoma ([place])   . Melanoma ([place])   . Osteopenia   . PE (pulmonary embolism)   . Seizures ([place])   . Stroke ([place])   . Vitamin D deficiency     Past Surgical History:  Procedure Laterality Date  . APPENDECTOMY  1955  . BREAST ENHANCEMENT SURGERY  1975  . CATARACT EXTRACTION  09/2004   left eye  . CATARACT EXTRACTION  11/2007   right eye  . Reading OF UTERUS  1972  . MELANOMA EXCISION  07/1997   left shoulder  . TONSILLECTOMY  1954  . TUBAL LIGATION  1975  . [place]   right leg    Family History  Problem Relation Age of Onset  . Heart attack Father   . Diabetes Father   . Atrial fibrillation Father   . Heart attack Mother   . Cancer Maternal Grandmother     esophageal  .  Lymphoma Brother     Social history:  reports that she quit smoking about 26 years ago. She has never used smokeless tobacco. She reports that she drinks about 8.4 oz of alcohol per week . She reports that she does not use drugs.    Allergies  Allergen Reactions  . Moxifloxacin     Redness, non-healing  . Latex Rash  . Tape Rash    Medications:  Prior to Admission medications   Medication Sig Start Date End Date Taking? Authorizing Provider  cholecalciferol (VITAMIN D) 1000 units tablet Take 1,000 Units by mouth daily.    Yes Historical Provider, MD  clobetasol cream (TEMOVATE) [id number] % Apply 1 application topically once a week.    Yes Historical Provider, MD  diphenhydrAMINE (BENADRYL) 25 mg capsule Take 25 mg by mouth at bedtime as needed.   Yes Historical Provider, MD  naproxen sodium (ANAPROX) 220 MG tablet Take 220 mg by mouth 2 (two) times daily with a meal.   Yes Historical Provider, MD  Probiotic Product (PROBIOTIC PO) Take by mouth.   Yes Historical Provider, MD  topiramate (TOPAMAX) 200 MG tablet TAKE HALF TAB IN THE MORNING AND THEN 1 TAB IN THE EVENING 10/26/15  Yes Kathrynn Ducking, MD  valACYclovir (VALTREX) 500 MG tablet Take  500 mg by mouth daily.   Yes Historical Provider, MD  zolpidem (AMBIEN) 10 MG tablet Take 10 mg by mouth at bedtime as needed.   Yes Historical Provider, MD    ROS:  Out of a complete 14 system review of symptoms, the patient complains only of the following symptoms, and all other reviewed systems are negative.   cold intolerance  Joint pain  Blood pressure 134/72, pulse 61, height 5\' 3"  (1.6 m), weight 162 lb (73.5 kg).  Physical Exam  General: The patient is alert and cooperative at the time of the examination.  Skin: No significant peripheral edema is noted.   Neurologic Exam  Mental status: The patient is alert and oriented x 3 at the time of the examination. The patient has apparent normal recent and remote memory, with an apparently  normal attention span and concentration ability.   Cranial nerves: Facial symmetry is present. Speech is normal, no aphasia or dysarthria is noted. Extraocular movements are full. Visual fields are full.  Motor: The patient has good strength in all 4 extremities.  Sensory examination: Soft touch sensation is symmetric on the face, arms, and legs.  Coordination: The patient has good finger-nose-finger and heel-to-shin bilaterally.  Gait and station: The patient has a normal gait. Tandem gait is normal. Romberg is negative. No drift is seen.  Reflexes: Deep tendon reflexes are symmetric.   Assessment/Plan:   1. Focal seizure disorder   The patient is well controlled with her seizures, she will remain on Topamax at this time. She will follow-up in one year, sooner if needed.  Jill Alexanders MD 11/09/2015 3:01 PM  [facility] [address] [place], [place] [address]  Phone [phone] Fax [phone]

## 2015-12-14 ENCOUNTER — Other Ambulatory Visit: Payer: Self-pay | Admitting: Obstetrics and Gynecology

## 2015-12-14 DIAGNOSIS — Z1231 Encounter for screening mammogram for malignant neoplasm of breast: Secondary | ICD-10-CM

## 2015-12-14 DIAGNOSIS — Z9882 Breast implant status: Secondary | ICD-10-CM

## 2016-01-05 DIAGNOSIS — B373 Candidiasis of vulva and vagina: Secondary | ICD-10-CM | POA: Diagnosis not present

## 2016-01-05 DIAGNOSIS — R350 Frequency of micturition: Secondary | ICD-10-CM | POA: Diagnosis not present

## 2016-01-14 DIAGNOSIS — Z23 Encounter for immunization: Secondary | ICD-10-CM | POA: Diagnosis not present

## 2016-01-14 DIAGNOSIS — M25461 Effusion, right knee: Secondary | ICD-10-CM | POA: Diagnosis not present

## 2016-01-14 DIAGNOSIS — M25561 Pain in right knee: Secondary | ICD-10-CM | POA: Diagnosis not present

## 2016-01-22 ENCOUNTER — Ambulatory Visit
Admission: RE | Admit: 2016-01-22 | Discharge: 2016-01-22 | Disposition: A | Discharge status: Home / Self Care | Payer: Self-pay | Source: Ambulatory Visit | Attending: Obstetrics and Gynecology | Admitting: Obstetrics and Gynecology

## 2016-01-22 DIAGNOSIS — Z1231 Encounter for screening mammogram for malignant neoplasm of breast: Secondary | ICD-10-CM

## 2016-01-22 DIAGNOSIS — Z9882 Breast implant status: Secondary | ICD-10-CM

## 2016-01-28 ENCOUNTER — Other Ambulatory Visit: Payer: Self-pay | Admitting: Obstetrics and Gynecology

## 2016-01-28 DIAGNOSIS — R928 Other abnormal and inconclusive findings on diagnostic imaging of breast: Secondary | ICD-10-CM

## 2016-02-02 ENCOUNTER — Ambulatory Visit
Admission: RE | Admit: 2016-02-02 | Discharge: 2016-02-02 | Disposition: A | Discharge status: Home / Self Care | Payer: PPO | Source: Ambulatory Visit | Attending: Obstetrics and Gynecology | Admitting: Obstetrics and Gynecology

## 2016-02-02 ENCOUNTER — Other Ambulatory Visit: Payer: Self-pay | Admitting: Obstetrics and Gynecology

## 2016-02-02 DIAGNOSIS — R928 Other abnormal and inconclusive findings on diagnostic imaging of breast: Secondary | ICD-10-CM | POA: Diagnosis not present

## 2016-02-02 DIAGNOSIS — N6489 Other specified disorders of breast: Secondary | ICD-10-CM | POA: Diagnosis not present

## 2016-04-21 DIAGNOSIS — G40209 Localization-related (focal) (partial) symptomatic epilepsy and epileptic syndromes with complex partial seizures, not intractable, without status epilepticus: Secondary | ICD-10-CM | POA: Diagnosis not present

## 2016-04-21 DIAGNOSIS — Z1389 Encounter for screening for other disorder: Secondary | ICD-10-CM | POA: Diagnosis not present

## 2016-04-21 DIAGNOSIS — K219 Gastro-esophageal reflux disease without esophagitis: Secondary | ICD-10-CM | POA: Diagnosis not present

## 2016-04-21 DIAGNOSIS — Z0000 Encounter for general adult medical examination without abnormal findings: Secondary | ICD-10-CM | POA: Diagnosis not present

## 2016-04-21 DIAGNOSIS — M8580 Other specified disorders of bone density and structure, unspecified site: Secondary | ICD-10-CM | POA: Diagnosis not present

## 2016-04-25 DIAGNOSIS — L900 Lichen sclerosus et atrophicus: Secondary | ICD-10-CM | POA: Diagnosis not present

## 2016-04-25 DIAGNOSIS — B373 Candidiasis of vulva and vagina: Secondary | ICD-10-CM | POA: Diagnosis not present

## 2016-05-11 ENCOUNTER — Other Ambulatory Visit: Payer: Self-pay | Admitting: Obstetrics and Gynecology

## 2016-05-11 DIAGNOSIS — N763 Subacute and chronic vulvitis: Secondary | ICD-10-CM | POA: Diagnosis not present

## 2016-05-11 DIAGNOSIS — N762 Acute vulvitis: Secondary | ICD-10-CM | POA: Diagnosis not present

## 2016-05-18 DIAGNOSIS — N762 Acute vulvitis: Secondary | ICD-10-CM | POA: Diagnosis not present

## 2016-05-18 DIAGNOSIS — T7840XA Allergy, unspecified, initial encounter: Secondary | ICD-10-CM | POA: Diagnosis not present

## 2016-05-18 DIAGNOSIS — N763 Subacute and chronic vulvitis: Secondary | ICD-10-CM | POA: Diagnosis not present

## 2016-05-19 DIAGNOSIS — L309 Dermatitis, unspecified: Secondary | ICD-10-CM | POA: Diagnosis not present

## 2016-05-26 DIAGNOSIS — L309 Dermatitis, unspecified: Secondary | ICD-10-CM | POA: Diagnosis not present

## 2016-06-07 DIAGNOSIS — M8588 Other specified disorders of bone density and structure, other site: Secondary | ICD-10-CM | POA: Diagnosis not present

## 2016-06-10 DIAGNOSIS — R300 Dysuria: Secondary | ICD-10-CM | POA: Diagnosis not present

## 2016-09-23 DIAGNOSIS — D225 Melanocytic nevi of trunk: Secondary | ICD-10-CM | POA: Diagnosis not present

## 2016-09-23 DIAGNOSIS — L821 Other seborrheic keratosis: Secondary | ICD-10-CM | POA: Diagnosis not present

## 2016-09-23 DIAGNOSIS — Z85820 Personal history of malignant melanoma of skin: Secondary | ICD-10-CM | POA: Diagnosis not present

## 2016-09-23 DIAGNOSIS — D2261 Melanocytic nevi of right upper limb, including shoulder: Secondary | ICD-10-CM | POA: Diagnosis not present

## 2016-10-22 ENCOUNTER — Other Ambulatory Visit: Payer: Self-pay | Admitting: Neurology

## 2016-11-08 ENCOUNTER — Ambulatory Visit: Payer: PPO | Admitting: Adult Health

## 2016-11-16 DIAGNOSIS — B379 Candidiasis, unspecified: Secondary | ICD-10-CM | POA: Diagnosis not present

## 2016-11-16 DIAGNOSIS — Z01411 Encounter for gynecological examination (general) (routine) with abnormal findings: Secondary | ICD-10-CM | POA: Diagnosis not present

## 2016-11-16 DIAGNOSIS — Q525 Fusion of labia: Secondary | ICD-10-CM | POA: Diagnosis not present

## 2016-11-16 DIAGNOSIS — L900 Lichen sclerosus et atrophicus: Secondary | ICD-10-CM | POA: Diagnosis not present

## 2016-11-16 DIAGNOSIS — B009 Herpesviral infection, unspecified: Secondary | ICD-10-CM | POA: Diagnosis not present

## 2016-11-21 ENCOUNTER — Ambulatory Visit (INDEPENDENT_AMBULATORY_CARE_PROVIDER_SITE_OTHER): Payer: PPO | Admitting: Adult Health

## 2016-11-21 ENCOUNTER — Encounter: Payer: Self-pay | Admitting: Adult Health

## 2016-11-21 VITALS — BP 115/73 | HR 56 | Wt 159.6 lb

## 2016-11-21 DIAGNOSIS — R569 Unspecified convulsions: Secondary | ICD-10-CM

## 2016-11-21 NOTE — Patient Instructions (Addendum)
Continue Topamax Try Melatonin 3 mg 2 hours before bedtime for sleep If your symptoms worsen or you develop new symptoms please let us know.

## 2016-11-21 NOTE — Progress Notes (Signed)
PATIENT: [name] DOB: 1940-05-05  REASON FOR VISIT: follow up- focal seizure HISTORY FROM: patient  HISTORY OF PRESENT ILLNESS: Today 11/21/16 [name] is a 76 year old female with a history of a left hemorrhagic stroke in 1996 and subsequent seizures. She returns today for follow-up. She reports that she has not had any seizure events. She lives at home with her husband. She is able to complete all ADLs independently. 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 She tolerates this medication well. She states that in the last few weeks she has been under additional stress as her brother suddenly passed away. She denies any new neurological symptoms. She returns today for an evaluation.   HISTORY 11/09/15: [name] is a 76 year old right-handed white female with a history of a left hemispheric hemorrhagic stroke in 1996. The patient has had seizures associated with transient aphasia lasting 5-10 minutes where she is unable to speak well and cannot understand language. The patient has done well on Topamax, she will have a very occasional brief event of difficulty with reading the newspaper in the morning , this event has only occurred twice in the last year. The patient is on Topamax and she is tolerating the medication well. The patient has not been able to tolerate multiple other anticonvulsant medications. She has not had any overt transient aphasia episodes since last seen one year ago. She returns for an evaluation. She has had some issues with right arm and leg pain thought secondary to bursitis. She has been seen through orthopedic surgery for this.  REVIEW OF SYSTEMS: Out of a complete 14 system review of symptoms, the patient complains only of the following symptoms, and all other reviewed systems are negative.  Frequent waking, cold intolerance  ALLERGIES: Allergies  Allergen Reactions  .  Moxifloxacin     Redness, non-healing  . Latex Rash  . Tape Rash    HOME MEDICATIONS: Outpatient Medications Prior to Visit  Medication Sig Dispense Refill  . cholecalciferol (VITAMIN D) 1000 units tablet Take 1,000 Units by mouth daily.     . clobetasol cream (TEMOVATE) 1.88 % Apply 1 application topically once a week.     . diphenhydrAMINE (BENADRYL) 25 mg capsule Take 25 mg by mouth at bedtime as needed.    . naproxen sodium (ANAPROX) 220 MG tablet Take 220 mg by mouth 2 (two) times daily with a meal.    . Probiotic Product (PROBIOTIC PO) Take by mouth.    . topiramate (TOPAMAX) 200 MG tablet TAKE HALF TAB BY MOUTH IN THE MORNING AND THEN 1 TAB IN THE EVENING 135 tablet 3  . valACYclovir (VALTREX) 500 MG tablet Take 500 mg by mouth daily.    Marland Kitchen zolpidem (AMBIEN) 10 MG tablet Take 10 mg by mouth at bedtime as needed.     No facility-administered medications prior to visit.     PAST MEDICAL HISTORY: Past Medical History:  Diagnosis Date  . Bilateral ovarian cysts   . Colitis, Clostridium difficile   . Focal seizure ([place]) 11/07/2014   aphasia  . Herpes   . IBS (irritable bowel syndrome)   . Lichen sclerosus   . Melanoma ([place])   . Melanoma ([place])   . Osteopenia   . PE (pulmonary embolism)   . Seizures ([place])   . Stroke ([place])   . Vitamin D deficiency     PAST SURGICAL HISTORY: Past  Surgical History:  Procedure Laterality Date  . APPENDECTOMY  1955  . BREAST ENHANCEMENT SURGERY  1975  . CATARACT EXTRACTION  09/2004   left eye  . CATARACT EXTRACTION  11/2007   right eye  . [place] OF UTERUS  1972  . MELANOMA EXCISION  07/1997   left shoulder  . TONSILLECTOMY  1954  . TUBAL LIGATION  1975  . [place]   right leg    FAMILY HISTORY: Family History  Problem Relation Age of Onset  . Heart attack Father   . Diabetes Father   . Atrial fibrillation Father   . Heart attack Mother   . Cancer Maternal Grandmother        esophageal  .  Lymphoma Brother     SOCIAL HISTORY: Social History   Social History  . Marital status: Married    Spouse name: N/A  . Number of children: 3  . Years of education: 61   Occupational History  . retired    Social History Main Topics  . Smoking status: Former Smoker    Quit date: 09/26/1989  . Smokeless tobacco: Never Used  . Alcohol use 8.4 oz/week    14 Glasses of wine per week     Comment: 2 glasses wine pm  . Drug use: No  . Sexual activity: Not on file   Other Topics Concern  . Not on file   Social History Narrative   Lives at home w/ her husband   Patient drinks 1-2 cups of caffeine daily.   Patient is right handed.      PHYSICAL EXAM  Vitals:   11/21/16 1005  BP: 115/73  Pulse: (!) 56  Weight: 159 lb 9.6 oz (72.4 kg)   Body mass index is 28.27 kg/m.  Generalized: Well developed, in no acute distress   Neurological examination  Mentation: Alert oriented to time, place, history taking. Follows all commands speech and language fluent Cranial nerve II-XII: Pupils were equal round reactive to light. Extraocular movements were full, visual field were full on confrontational test. Facial sensation and strength were normal. Uvula tongue midline. Head turning and shoulder shrug  were normal and symmetric. Motor: The motor testing reveals 5 over 5 strength of all 4 extremities. Good symmetric motor tone is noted throughout.  Sensory: Sensory testing is intact to soft touch on all 4 extremities. No evidence of extinction is noted.  Coordination: Cerebellar testing reveals good finger-nose-finger and heel-to-shin bilaterally.  Gait and station: Gait is normal. Tandem gait is normal. Romberg is negative. No drift is seen.  Reflexes: Deep tendon reflexes are symmetric and normal bilaterally.   DIAGNOSTIC DATA (LABS, IMAGING, TESTING) - I reviewed patient records, labs, notes, testing and imaging myself where available.  Lab Results  Component Value Date   WBC 4.6  09/01/2006   HGB 14.8 09/01/2006   HCT 43.1 09/01/2006   MCV 93.5 09/01/2006   PLT 222 09/01/2006      Component Value Date/Time   NA 138 09/01/2006 1405   K 3.7 09/01/2006 1405   CL 105 09/01/2006 1405   CO2 24 09/01/2006 1405   GLUCOSE 107 (H) 09/01/2006 1405   BUN 17 09/01/2006 1405   CREATININE 0.7 09/01/2006 1405   CALCIUM 9.6 09/01/2006 1405   PROT 6.9 09/01/2006 1405   ALBUMIN 4.2 09/01/2006 1405   AST 21 09/01/2006 1405   ALT 19 09/01/2006 1405   ALKPHOS 105 09/01/2006 1405   BILITOT 0.8 09/01/2006  [place] 09/01/2006 1405   GFRAA 109 09/01/2006 1405   Lab Results  Component Value Date   CHOL 235 (HH) 09/01/2006   HDL 75.5 09/01/2006   LDLDIRECT 135.4 09/01/2006   TRIG 67 09/01/2006   CHOLHDL 3.1 CALC 09/01/2006   Lab Results  Component Value Date   HGBA1C 5.4 09/01/2006   Lab Results  Component Value Date   VITAMINB12 208 (L) 09/05/2006   No results found for: TSH    ASSESSMENT AND PLAN 76 y.o. year old female  has a past medical history of Bilateral ovarian cysts; Colitis, Clostridium difficile; Focal seizure ([place]) (11/07/2014); Herpes; IBS (irritable bowel syndrome); Lichen sclerosus; Melanoma ([place]); Melanoma ([place]); Osteopenia; PE (pulmonary embolism); Seizures ([place]); Stroke [facility]); and Vitamin D deficiency. here with:  1. Focal Seizure disorder  Overall the patient has been well. She will remain on Topamax taking 100 mg in the morning and 200 mg in the evening.She is encouraged to make sure she takes her medication at the same time every day. She is advised that if she has any seizure events she should let us know. She will follow-up in one year or sooner if needed.  I spent 15 minutes with the patient. 50% of this time was spent discussing her medication and seizure precautions.      Ward Givens, MSN, NP-C 11/21/2016, 10:02 AM [facility] [address], [place], [place] [address] [phone]

## 2016-11-21 NOTE — Progress Notes (Signed)
I have read the note, and I agree with the clinical assessment and plan.  [name],[name]   

## 2016-12-05 DIAGNOSIS — F4321 Adjustment disorder with depressed mood: Secondary | ICD-10-CM | POA: Diagnosis not present

## 2016-12-23 ENCOUNTER — Other Ambulatory Visit: Payer: Self-pay | Admitting: Obstetrics and Gynecology

## 2016-12-23 DIAGNOSIS — Z1231 Encounter for screening mammogram for malignant neoplasm of breast: Secondary | ICD-10-CM

## 2017-01-03 DIAGNOSIS — K582 Mixed irritable bowel syndrome: Secondary | ICD-10-CM | POA: Diagnosis not present

## 2017-01-03 DIAGNOSIS — R5383 Other fatigue: Secondary | ICD-10-CM | POA: Diagnosis not present

## 2017-01-03 DIAGNOSIS — Z23 Encounter for immunization: Secondary | ICD-10-CM | POA: Diagnosis not present

## 2017-01-03 DIAGNOSIS — E559 Vitamin D deficiency, unspecified: Secondary | ICD-10-CM | POA: Diagnosis not present

## 2017-01-03 DIAGNOSIS — R350 Frequency of micturition: Secondary | ICD-10-CM | POA: Diagnosis not present

## 2017-01-03 DIAGNOSIS — R6889 Other general symptoms and signs: Secondary | ICD-10-CM | POA: Diagnosis not present

## 2017-01-03 DIAGNOSIS — K219 Gastro-esophageal reflux disease without esophagitis: Secondary | ICD-10-CM | POA: Diagnosis not present

## 2017-01-23 DIAGNOSIS — H02056 Trichiasis without entropian left eye, unspecified eyelid: Secondary | ICD-10-CM | POA: Diagnosis not present

## 2017-01-24 ENCOUNTER — Ambulatory Visit
Admission: RE | Admit: 2017-01-24 | Discharge: 2017-01-24 | Disposition: A | Discharge status: Home / Self Care | Payer: PPO | Source: Ambulatory Visit | Attending: Obstetrics and Gynecology | Admitting: Obstetrics and Gynecology

## 2017-01-24 DIAGNOSIS — Z1231 Encounter for screening mammogram for malignant neoplasm of breast: Secondary | ICD-10-CM | POA: Diagnosis not present

## 2017-02-09 DIAGNOSIS — L658 Other specified nonscarring hair loss: Secondary | ICD-10-CM | POA: Diagnosis not present

## 2017-02-09 DIAGNOSIS — L309 Dermatitis, unspecified: Secondary | ICD-10-CM | POA: Diagnosis not present

## 2017-03-23 DIAGNOSIS — L900 Lichen sclerosus et atrophicus: Secondary | ICD-10-CM | POA: Diagnosis not present

## 2017-04-25 DIAGNOSIS — G40209 Localization-related (focal) (partial) symptomatic epilepsy and epileptic syndromes with complex partial seizures, not intractable, without status epilepticus: Secondary | ICD-10-CM | POA: Diagnosis not present

## 2017-04-25 DIAGNOSIS — Z0000 Encounter for general adult medical examination without abnormal findings: Secondary | ICD-10-CM | POA: Diagnosis not present

## 2017-04-25 DIAGNOSIS — Z1389 Encounter for screening for other disorder: Secondary | ICD-10-CM | POA: Diagnosis not present

## 2017-04-25 DIAGNOSIS — K589 Irritable bowel syndrome without diarrhea: Secondary | ICD-10-CM | POA: Diagnosis not present

## 2017-04-25 DIAGNOSIS — K219 Gastro-esophageal reflux disease without esophagitis: Secondary | ICD-10-CM | POA: Diagnosis not present

## 2017-04-27 ENCOUNTER — Telehealth: Payer: Self-pay | Admitting: *Deleted

## 2017-04-27 MED ORDER — TOPIRAMATE 100 MG PO TABS: ORAL_TABLET | ORAL | 2 refills | Status: DC

## 2017-04-27 NOTE — Telephone Encounter (Signed)
Received fax notification from [facility] that manufacturer [facility] has d/c'd topiramate 200mg  tablets and they need a new rx for 25 mg tablets to equal current dose she is on.   I called pharmacy. They stated they think we can call in 100mg  tablets instead. Patient will take topiramate 100mg  tablet (1 tablet in the morning and 2 tablets in the evening).

## 2017-06-16 DIAGNOSIS — R509 Fever, unspecified: Secondary | ICD-10-CM | POA: Diagnosis not present

## 2017-06-16 DIAGNOSIS — J111 Influenza due to unidentified influenza virus with other respiratory manifestations: Secondary | ICD-10-CM | POA: Diagnosis not present

## 2017-06-16 DIAGNOSIS — J0100 Acute maxillary sinusitis, unspecified: Secondary | ICD-10-CM | POA: Diagnosis not present

## 2017-06-27 DIAGNOSIS — R680 Hypothermia, not associated with low environmental temperature: Secondary | ICD-10-CM | POA: Diagnosis not present

## 2017-06-27 DIAGNOSIS — Z79899 Other long term (current) drug therapy: Secondary | ICD-10-CM | POA: Diagnosis not present

## 2017-08-14 ENCOUNTER — Ambulatory Visit
Admission: RE | Admit: 2017-08-14 | Discharge: 2017-08-14 | Disposition: A | Discharge status: Home / Self Care | Payer: PPO | Source: Ambulatory Visit | Attending: Internal Medicine | Admitting: Internal Medicine

## 2017-08-14 ENCOUNTER — Other Ambulatory Visit: Payer: Self-pay | Admitting: Internal Medicine

## 2017-08-14 DIAGNOSIS — R05 Cough: Secondary | ICD-10-CM

## 2017-08-14 DIAGNOSIS — R6889 Other general symptoms and signs: Secondary | ICD-10-CM | POA: Diagnosis not present

## 2017-08-14 DIAGNOSIS — R059 Cough, unspecified: Secondary | ICD-10-CM

## 2017-08-14 DIAGNOSIS — J209 Acute bronchitis, unspecified: Secondary | ICD-10-CM | POA: Diagnosis not present

## 2017-08-14 DIAGNOSIS — R5383 Other fatigue: Secondary | ICD-10-CM | POA: Diagnosis not present

## 2017-08-18 DIAGNOSIS — R6889 Other general symptoms and signs: Secondary | ICD-10-CM | POA: Diagnosis not present

## 2017-08-18 DIAGNOSIS — R5383 Other fatigue: Secondary | ICD-10-CM | POA: Diagnosis not present

## 2017-08-28 DIAGNOSIS — N760 Acute vaginitis: Secondary | ICD-10-CM | POA: Diagnosis not present

## 2017-09-04 ENCOUNTER — Ambulatory Visit
Admission: RE | Admit: 2017-09-04 | Discharge: 2017-09-04 | Disposition: A | Discharge status: Home / Self Care | Payer: PPO | Source: Ambulatory Visit | Attending: Internal Medicine | Admitting: Internal Medicine

## 2017-09-04 ENCOUNTER — Other Ambulatory Visit: Payer: Self-pay | Admitting: Internal Medicine

## 2017-09-04 DIAGNOSIS — J189 Pneumonia, unspecified organism: Secondary | ICD-10-CM

## 2017-09-04 DIAGNOSIS — J181 Lobar pneumonia, unspecified organism: Principal | ICD-10-CM

## 2017-09-14 DIAGNOSIS — D225 Melanocytic nevi of trunk: Secondary | ICD-10-CM | POA: Diagnosis not present

## 2017-09-14 DIAGNOSIS — D2261 Melanocytic nevi of right upper limb, including shoulder: Secondary | ICD-10-CM | POA: Diagnosis not present

## 2017-09-14 DIAGNOSIS — D2272 Melanocytic nevi of left lower limb, including hip: Secondary | ICD-10-CM | POA: Diagnosis not present

## 2017-09-14 DIAGNOSIS — Z09 Encounter for follow-up examination after completed treatment for conditions other than malignant neoplasm: Secondary | ICD-10-CM | POA: Diagnosis not present

## 2017-09-14 DIAGNOSIS — Z08 Encounter for follow-up examination after completed treatment for malignant neoplasm: Secondary | ICD-10-CM | POA: Diagnosis not present

## 2017-09-14 DIAGNOSIS — Z85820 Personal history of malignant melanoma of skin: Secondary | ICD-10-CM | POA: Diagnosis not present

## 2017-09-14 DIAGNOSIS — D2271 Melanocytic nevi of right lower limb, including hip: Secondary | ICD-10-CM | POA: Diagnosis not present

## 2017-09-14 DIAGNOSIS — D2262 Melanocytic nevi of left upper limb, including shoulder: Secondary | ICD-10-CM | POA: Diagnosis not present

## 2017-09-14 DIAGNOSIS — Z872 Personal history of diseases of the skin and subcutaneous tissue: Secondary | ICD-10-CM | POA: Diagnosis not present

## 2017-09-14 DIAGNOSIS — L309 Dermatitis, unspecified: Secondary | ICD-10-CM | POA: Diagnosis not present

## 2017-11-01 DIAGNOSIS — L309 Dermatitis, unspecified: Secondary | ICD-10-CM | POA: Diagnosis not present

## 2017-11-17 ENCOUNTER — Other Ambulatory Visit: Payer: Self-pay | Admitting: Obstetrics and Gynecology

## 2017-11-17 DIAGNOSIS — Z1231 Encounter for screening mammogram for malignant neoplasm of breast: Secondary | ICD-10-CM

## 2017-11-17 DIAGNOSIS — N952 Postmenopausal atrophic vaginitis: Secondary | ICD-10-CM | POA: Diagnosis not present

## 2017-11-17 DIAGNOSIS — Z9189 Other specified personal risk factors, not elsewhere classified: Secondary | ICD-10-CM | POA: Diagnosis not present

## 2017-11-17 DIAGNOSIS — L900 Lichen sclerosus et atrophicus: Secondary | ICD-10-CM | POA: Diagnosis not present

## 2017-11-17 DIAGNOSIS — Z8619 Personal history of other infectious and parasitic diseases: Secondary | ICD-10-CM | POA: Diagnosis not present

## 2017-11-17 DIAGNOSIS — R102 Pelvic and perineal pain: Secondary | ICD-10-CM | POA: Diagnosis not present

## 2017-11-17 DIAGNOSIS — N644 Mastodynia: Secondary | ICD-10-CM

## 2017-11-21 ENCOUNTER — Ambulatory Visit: Payer: PPO | Admitting: Adult Health

## 2017-11-21 ENCOUNTER — Other Ambulatory Visit: Payer: Self-pay | Admitting: Obstetrics and Gynecology

## 2017-11-21 ENCOUNTER — Encounter: Payer: Self-pay | Admitting: Adult Health

## 2017-11-21 VITALS — BP 142/72 | HR 66 | Ht 63.0 in | Wt 162.8 lb

## 2017-11-21 DIAGNOSIS — N644 Mastodynia: Secondary | ICD-10-CM

## 2017-11-21 DIAGNOSIS — R569 Unspecified convulsions: Secondary | ICD-10-CM

## 2017-11-21 MED ORDER — TOPIRAMATE 100 MG PO TABS: ORAL_TABLET | ORAL | 3 refills | Status: DC

## 2017-11-21 NOTE — Patient Instructions (Signed)
Your Plan:  Continue Topamax If your symptoms worsen or you develop new symptoms please let us know.   Thank you for coming to see us at [facility]. I hope we have been able to provide you high quality care today.  You may receive a patient satisfaction survey over the next few weeks. We would appreciate your feedback and comments so that we may continue to improve ourselves and the health of our patients.  

## 2017-11-21 NOTE — Progress Notes (Signed)
PATIENT: [name] DOB: Aug 15, 1940  REASON FOR VISIT: follow up HISTORY FROM: patient  HISTORY OF PRESENT ILLNESS: Today 11/21/17: Ms. [name] is a 77 year old female with a history of left hemorrhagic stroke in 1996 and subsequent seizures.  She returns today for follow-up.  She states that she has not had any true seizure events in the last year.  She states that over the last year she has had 2 or 3 episodes of "not feeling quite right."  She states that she typically will wake up in the morning and she may find it difficult to read the newspaper or understand what is being said on television.  She reports that this may last minutes but no longer than 10 minutes.  She reports that this is very infrequent ( only 2-3 episodes over the last year).  She reports that she continues to tolerate Topamax well.  She states that she has been under a lot of stress over the last year with the death of her brother and becoming the sole caregiver for her father.  She returns today for evaluation.   HISTORY 11/21/16 [name] is a 77 year old female with a history of a left hemorrhagic stroke in 1996 and subsequent seizures. She returns today for follow-up. She reports that she has not had any seizure events. She lives at home with her husband. She is able to complete all ADLs independently. 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 She tolerates this medication well. She states that in the last few weeks she has been under additional stress as her brother suddenly passed away. She denies any new neurological symptoms. She returns today for an evaluation.   REVIEW OF SYSTEMS: Out of a complete 14 system review of symptoms, the patient complains only of the following symptoms, and all other reviewed systems are negative.  See HPI  ALLERGIES: Allergies  Allergen Reactions  . Moxifloxacin     Redness,  non-healing  . Latex Rash  . Tape Rash    HOME MEDICATIONS: Outpatient Medications Prior to Visit  Medication Sig Dispense Refill  . cholecalciferol (VITAMIN D) 1000 units tablet Take 1,000 Units by mouth daily.     . clobetasol cream (TEMOVATE) 5.80 % Apply 1 application topically once a week.     . famotidine (PEPCID) 20 MG tablet Take 20 mg by mouth 2 (two) times daily.  3  . levothyroxine (SYNTHROID, LEVOTHROID) 25 MCG tablet TAKE 1 TABLET BY MOUTH EVERY DAY ON EMPTY STOMACH IN THE MORNING  5  . LORazepam (ATIVAN) 1 MG tablet TAKE 1 TABLET BY MOUTH EVERYDAY AT BEDTIME  1  . Probiotic Product (PROBIOTIC PO) Take by mouth.    . topiramate (TOPAMAX) 100 MG tablet TAKE ONE TABLET BY MOUTH IN THE MORNING AND THEN 2 TABLETS IN THE EVENING 270 tablet 2  . valACYclovir (VALTREX) 500 MG tablet Take 500 mg by mouth daily.    . diphenhydrAMINE (BENADRYL) 25 mg capsule Take 25 mg by mouth at bedtime as needed.    . zolpidem (AMBIEN) 10 MG tablet Take 10 mg by mouth at bedtime as needed.     No facility-administered medications prior to visit.     PAST MEDICAL HISTORY: Past Medical History:  Diagnosis Date  . Bilateral ovarian cysts   . Colitis, Clostridium difficile   . Focal seizure ([place]) 11/07/2014   aphasia  . Herpes   .  IBS (irritable bowel syndrome)   . Lichen sclerosus   . Melanoma ([place])   . Melanoma ([place])   . Osteopenia   . PE (pulmonary embolism)   . Seizures ([place])   . Stroke ([place])   . Vitamin D deficiency     PAST SURGICAL HISTORY: Past Surgical History:  Procedure Laterality Date  . APPENDECTOMY  1955  . AUGMENTATION MAMMAPLASTY Bilateral 9735   silicone  . BREAST ENHANCEMENT SURGERY  1975  . CATARACT EXTRACTION  09/2004   left eye  . CATARACT EXTRACTION  11/2007   right eye  . [place] OF UTERUS  1972  . MELANOMA EXCISION  07/1997   left shoulder  . TONSILLECTOMY  1954  . TUBAL LIGATION  1975  . [place]   right leg     FAMILY HISTORY: Family History  Problem Relation Age of Onset  . Heart attack Father   . Diabetes Father   . Atrial fibrillation Father   . Heart attack Mother   . Cancer Maternal Grandmother        esophageal  . Lymphoma Brother     SOCIAL HISTORY: Social History   Socioeconomic History  . Marital status: Married    Spouse name: Not on file  . Number of children: 3  . Years of education: 80  . Highest education level: Not on file  Occupational History  . Occupation: retired  Scientific laboratory technician  . Financial resource strain: Not on file  . Food insecurity:    Worry: Not on file    Inability: Not on file  . Transportation needs:    Medical: Not on file    Non-medical: Not on file  Tobacco Use  . Smoking status: Former Smoker    Last attempt to quit: 09/26/1989    Years since quitting: 28.1  . Smokeless tobacco: Never Used  Substance and Sexual Activity  . Alcohol use: Yes    Alcohol/week: 8.4 oz    Types: 14 Glasses of wine per week    Comment: 2 glasses wine pm  . Drug use: No  . Sexual activity: Not on file  Lifestyle  . Physical activity:    Days per week: Not on file    Minutes per session: Not on file  . Stress: Not on file  Relationships  . Social connections:    Talks on phone: Not on file    Gets together: Not on file    Attends religious service: Not on file    Active member of club or organization: Not on file    Attends meetings of clubs or organizations: Not on file    Relationship status: Not on file  . Intimate partner violence:    Fear of current or ex partner: Not on file    Emotionally abused: Not on file    Physically abused: Not on file    Forced sexual activity: Not on file  Other Topics Concern  . Not on file  Social History Narrative   Lives at home w/ her husband   Patient drinks 1-2 cups of caffeine daily.   Patient is right handed.      PHYSICAL EXAM  Vitals:   11/21/17 1244  BP: (!) 142/72  Pulse: 66  Weight: 162 lb  12.8 oz (73.8 kg)  Height: 5\' 3"  (1.6 m)   Body mass index is 28.84 kg/m.  Generalized: Well developed, in no acute distress   Neurological examination  Mentation: Alert  oriented to time, place, history taking. Follows all commands speech and language fluent Cranial nerve II-XII: Pupils were equal round reactive to light. Extraocular movements were full, visual field were full on confrontational test. Facial sensation and strength were normal. Uvula tongue midline. Head turning and shoulder shrug  were normal and symmetric. Motor: The motor testing reveals 5 over 5 strength of all 4 extremities. Good symmetric motor tone is noted throughout.  Sensory: Sensory testing is intact to soft touch on all 4 extremities. No evidence of extinction is noted.  Coordination: Cerebellar testing reveals good finger-nose-finger and heel-to-shin bilaterally.  Gait and station: Gait is normal. Tandem gait is normal. Romberg is negative. No drift is seen.  Reflexes: Deep tendon reflexes are symmetric and normal bilaterally.   DIAGNOSTIC DATA (LABS, IMAGING, TESTING) - I reviewed patient records, labs, notes, testing and imaging myself where available.  Lab Results  Component Value Date   WBC 4.6 09/01/2006   HGB 14.8 09/01/2006   HCT 43.1 09/01/2006   MCV 93.5 09/01/2006   PLT 222 09/01/2006      Component Value Date/Time   NA 138 09/01/2006 1405   K 3.7 09/01/2006 1405   CL 105 09/01/2006 1405   CO2 24 09/01/2006 1405   GLUCOSE 107 (H) 09/01/2006 1405   BUN 17 09/01/2006 1405   CREATININE 0.7 09/01/2006 1405   CALCIUM 9.6 09/01/2006 1405   PROT 6.9 09/01/2006 1405   ALBUMIN 4.2 09/01/2006 1405   AST 21 09/01/2006 1405   ALT 19 09/01/2006 1405   ALKPHOS 105 09/01/2006 1405   BILITOT 0.8 09/01/2006 1405   GFRNONAA 90 09/01/2006 1405   GFRAA 109 09/01/2006 1405   Lab Results  Component Value Date   CHOL 235 (HH) 09/01/2006   HDL 75.5 09/01/2006   LDLDIRECT 135.4 09/01/2006   TRIG 67  09/01/2006   CHOLHDL 3.1 CALC 09/01/2006   Lab Results  Component Value Date   HGBA1C 5.4 09/01/2006   Lab Results  Component Value Date   VITAMINB12 208 (L) 09/05/2006   No results found for: TSH    ASSESSMENT AND PLAN 77 y.o. year old female  has a past medical history of Bilateral ovarian cysts, Colitis, Clostridium difficile, Focal seizure ([place]) (11/07/2014), Herpes, IBS (irritable bowel syndrome), Lichen sclerosus, Melanoma ([place]), Melanoma ([place]), Osteopenia, PE (pulmonary embolism), Seizures ([place]), Stroke ([place]), and Vitamin D deficiency. here with:  1.  Focal seizure disorder  Overall the patient has done well.  She has not had any seizure events.  She will continue on Topamax.  I have advised that if she has any seizures or an increase in the frequency of events that she described in HPI she should she will follow-up [place] know.  She will follow up in 1 year or sooner if needed.   I spent 15 minutes with the patient. 50% of this time was spent   Ward Givens, MSN, NP-C 11/21/2017, 1:17 PM [facility] [address], [place], [place] [address] [phone]

## 2017-11-23 ENCOUNTER — Other Ambulatory Visit: Payer: PPO

## 2017-11-23 ENCOUNTER — Other Ambulatory Visit: Payer: Self-pay | Admitting: Obstetrics and Gynecology

## 2017-11-23 ENCOUNTER — Ambulatory Visit
Admission: RE | Admit: 2017-11-23 | Discharge: 2017-11-23 | Disposition: A | Discharge status: Home / Self Care | Payer: PPO | Source: Ambulatory Visit | Attending: Obstetrics and Gynecology | Admitting: Obstetrics and Gynecology

## 2017-11-23 DIAGNOSIS — N949 Unspecified condition associated with female genital organs and menstrual cycle: Secondary | ICD-10-CM | POA: Diagnosis not present

## 2017-11-23 DIAGNOSIS — N644 Mastodynia: Secondary | ICD-10-CM

## 2017-11-23 DIAGNOSIS — R102 Pelvic and perineal pain: Secondary | ICD-10-CM | POA: Diagnosis not present

## 2017-11-23 DIAGNOSIS — R928 Other abnormal and inconclusive findings on diagnostic imaging of breast: Secondary | ICD-10-CM | POA: Diagnosis not present

## 2017-12-14 ENCOUNTER — Other Ambulatory Visit: Payer: Self-pay | Admitting: Obstetrics and Gynecology

## 2017-12-14 DIAGNOSIS — R194 Change in bowel habit: Secondary | ICD-10-CM | POA: Diagnosis not present

## 2017-12-14 DIAGNOSIS — Z1231 Encounter for screening mammogram for malignant neoplasm of breast: Secondary | ICD-10-CM

## 2017-12-15 DIAGNOSIS — W540XXA Bitten by dog, initial encounter: Secondary | ICD-10-CM | POA: Diagnosis not present

## 2017-12-15 DIAGNOSIS — S61254A Open bite of right ring finger without damage to nail, initial encounter: Secondary | ICD-10-CM | POA: Diagnosis not present

## 2017-12-26 DIAGNOSIS — L089 Local infection of the skin and subcutaneous tissue, unspecified: Secondary | ICD-10-CM | POA: Diagnosis not present

## 2017-12-26 DIAGNOSIS — W540XXD Bitten by dog, subsequent encounter: Secondary | ICD-10-CM | POA: Diagnosis not present

## 2017-12-26 DIAGNOSIS — S61259A Open bite of unspecified finger without damage to nail, initial encounter: Secondary | ICD-10-CM | POA: Diagnosis not present

## 2017-12-26 DIAGNOSIS — S61452A Open bite of left hand, initial encounter: Secondary | ICD-10-CM | POA: Diagnosis not present

## 2018-01-19 DIAGNOSIS — K648 Other hemorrhoids: Secondary | ICD-10-CM | POA: Diagnosis not present

## 2018-01-19 DIAGNOSIS — R194 Change in bowel habit: Secondary | ICD-10-CM | POA: Diagnosis not present

## 2018-01-19 DIAGNOSIS — K5730 Diverticulosis of large intestine without perforation or abscess without bleeding: Secondary | ICD-10-CM | POA: Diagnosis not present

## 2018-01-26 ENCOUNTER — Ambulatory Visit
Admission: RE | Admit: 2018-01-26 | Discharge: 2018-01-26 | Disposition: A | Discharge status: Home / Self Care | Payer: PPO | Source: Ambulatory Visit | Attending: Obstetrics and Gynecology | Admitting: Obstetrics and Gynecology

## 2018-01-26 DIAGNOSIS — Z1231 Encounter for screening mammogram for malignant neoplasm of breast: Secondary | ICD-10-CM | POA: Diagnosis not present

## 2018-02-12 DIAGNOSIS — E039 Hypothyroidism, unspecified: Secondary | ICD-10-CM | POA: Diagnosis not present

## 2018-02-12 DIAGNOSIS — K582 Mixed irritable bowel syndrome: Secondary | ICD-10-CM | POA: Diagnosis not present

## 2018-02-12 DIAGNOSIS — F4321 Adjustment disorder with depressed mood: Secondary | ICD-10-CM | POA: Diagnosis not present

## 2018-02-14 DIAGNOSIS — N952 Postmenopausal atrophic vaginitis: Secondary | ICD-10-CM | POA: Diagnosis not present

## 2018-02-14 DIAGNOSIS — Z23 Encounter for immunization: Secondary | ICD-10-CM | POA: Diagnosis not present

## 2018-02-26 DIAGNOSIS — K582 Mixed irritable bowel syndrome: Secondary | ICD-10-CM | POA: Diagnosis not present

## 2018-02-26 DIAGNOSIS — F4321 Adjustment disorder with depressed mood: Secondary | ICD-10-CM | POA: Diagnosis not present

## 2018-02-26 DIAGNOSIS — E039 Hypothyroidism, unspecified: Secondary | ICD-10-CM | POA: Diagnosis not present

## 2018-04-09 DIAGNOSIS — R05 Cough: Secondary | ICD-10-CM | POA: Diagnosis not present

## 2018-04-09 DIAGNOSIS — J0110 Acute frontal sinusitis, unspecified: Secondary | ICD-10-CM | POA: Diagnosis not present

## 2018-04-13 DIAGNOSIS — F4321 Adjustment disorder with depressed mood: Secondary | ICD-10-CM | POA: Diagnosis not present

## 2018-04-13 DIAGNOSIS — E039 Hypothyroidism, unspecified: Secondary | ICD-10-CM | POA: Diagnosis not present

## 2018-04-16 ENCOUNTER — Ambulatory Visit
Admission: RE | Admit: 2018-04-16 | Discharge: 2018-04-16 | Disposition: A | Discharge status: Home / Self Care | Payer: PPO | Source: Ambulatory Visit | Attending: Nurse Practitioner | Admitting: Nurse Practitioner

## 2018-04-16 ENCOUNTER — Other Ambulatory Visit: Payer: Self-pay | Admitting: Nurse Practitioner

## 2018-04-16 DIAGNOSIS — J40 Bronchitis, not specified as acute or chronic: Secondary | ICD-10-CM | POA: Diagnosis not present

## 2018-04-16 DIAGNOSIS — R05 Cough: Secondary | ICD-10-CM | POA: Diagnosis not present

## 2018-04-16 DIAGNOSIS — J0110 Acute frontal sinusitis, unspecified: Secondary | ICD-10-CM | POA: Diagnosis not present

## 2018-04-23 DIAGNOSIS — R062 Wheezing: Secondary | ICD-10-CM | POA: Diagnosis not present

## 2018-04-23 DIAGNOSIS — J40 Bronchitis, not specified as acute or chronic: Secondary | ICD-10-CM | POA: Diagnosis not present

## 2018-05-02 DIAGNOSIS — Z136 Encounter for screening for cardiovascular disorders: Secondary | ICD-10-CM | POA: Diagnosis not present

## 2018-05-02 DIAGNOSIS — J189 Pneumonia, unspecified organism: Secondary | ICD-10-CM | POA: Diagnosis not present

## 2018-05-02 DIAGNOSIS — Z85820 Personal history of malignant melanoma of skin: Secondary | ICD-10-CM | POA: Diagnosis not present

## 2018-05-02 DIAGNOSIS — M8580 Other specified disorders of bone density and structure, unspecified site: Secondary | ICD-10-CM | POA: Diagnosis not present

## 2018-05-02 DIAGNOSIS — Z23 Encounter for immunization: Secondary | ICD-10-CM | POA: Diagnosis not present

## 2018-05-02 DIAGNOSIS — Z1389 Encounter for screening for other disorder: Secondary | ICD-10-CM | POA: Diagnosis not present

## 2018-05-02 DIAGNOSIS — Z0000 Encounter for general adult medical examination without abnormal findings: Secondary | ICD-10-CM | POA: Diagnosis not present

## 2018-05-02 DIAGNOSIS — G40209 Localization-related (focal) (partial) symptomatic epilepsy and epileptic syndromes with complex partial seizures, not intractable, without status epilepticus: Secondary | ICD-10-CM | POA: Diagnosis not present

## 2018-05-02 DIAGNOSIS — K589 Irritable bowel syndrome without diarrhea: Secondary | ICD-10-CM | POA: Diagnosis not present

## 2018-05-09 DIAGNOSIS — L821 Other seborrheic keratosis: Secondary | ICD-10-CM | POA: Diagnosis not present

## 2018-05-09 DIAGNOSIS — L900 Lichen sclerosus et atrophicus: Secondary | ICD-10-CM | POA: Diagnosis not present

## 2018-05-09 DIAGNOSIS — Z85820 Personal history of malignant melanoma of skin: Secondary | ICD-10-CM | POA: Diagnosis not present

## 2018-05-09 DIAGNOSIS — Z09 Encounter for follow-up examination after completed treatment for conditions other than malignant neoplasm: Secondary | ICD-10-CM | POA: Diagnosis not present

## 2018-05-09 DIAGNOSIS — Z08 Encounter for follow-up examination after completed treatment for malignant neoplasm: Secondary | ICD-10-CM | POA: Diagnosis not present

## 2018-05-09 DIAGNOSIS — Z872 Personal history of diseases of the skin and subcutaneous tissue: Secondary | ICD-10-CM | POA: Diagnosis not present

## 2018-05-15 ENCOUNTER — Ambulatory Visit
Admission: RE | Admit: 2018-05-15 | Discharge: 2018-05-15 | Disposition: A | Discharge status: Home / Self Care | Payer: PPO | Source: Ambulatory Visit | Attending: Internal Medicine | Admitting: Internal Medicine

## 2018-05-15 ENCOUNTER — Other Ambulatory Visit: Payer: Self-pay | Admitting: Internal Medicine

## 2018-05-15 DIAGNOSIS — J40 Bronchitis, not specified as acute or chronic: Secondary | ICD-10-CM

## 2018-05-15 DIAGNOSIS — R05 Cough: Secondary | ICD-10-CM | POA: Diagnosis not present

## 2018-07-11 DIAGNOSIS — E039 Hypothyroidism, unspecified: Secondary | ICD-10-CM | POA: Diagnosis not present

## 2018-07-11 DIAGNOSIS — F4321 Adjustment disorder with depressed mood: Secondary | ICD-10-CM | POA: Diagnosis not present

## 2018-08-08 DIAGNOSIS — F4321 Adjustment disorder with depressed mood: Secondary | ICD-10-CM | POA: Diagnosis not present

## 2018-08-08 DIAGNOSIS — M8580 Other specified disorders of bone density and structure, unspecified site: Secondary | ICD-10-CM | POA: Diagnosis not present

## 2018-08-08 DIAGNOSIS — E039 Hypothyroidism, unspecified: Secondary | ICD-10-CM | POA: Diagnosis not present

## 2018-09-11 DIAGNOSIS — M8580 Other specified disorders of bone density and structure, unspecified site: Secondary | ICD-10-CM | POA: Diagnosis not present

## 2018-09-11 DIAGNOSIS — F4321 Adjustment disorder with depressed mood: Secondary | ICD-10-CM | POA: Diagnosis not present

## 2018-09-11 DIAGNOSIS — E039 Hypothyroidism, unspecified: Secondary | ICD-10-CM | POA: Diagnosis not present

## 2018-09-18 ENCOUNTER — Telehealth: Payer: Self-pay

## 2018-09-18 NOTE — Telephone Encounter (Signed)
Spoke with the patient to offer her a sooner appt with [facility]. She declined the offer stating she would rather keep her appt with [facility]. She appreciated the call and stated that if she changes her mind before her appt that she would give [place] a call.

## 2018-09-27 DIAGNOSIS — E039 Hypothyroidism, unspecified: Secondary | ICD-10-CM | POA: Diagnosis not present

## 2018-10-01 ENCOUNTER — Other Ambulatory Visit: Payer: Self-pay | Admitting: Adult Health

## 2018-10-02 ENCOUNTER — Other Ambulatory Visit: Payer: Self-pay | Admitting: Adult Health

## 2018-10-04 ENCOUNTER — Telehealth: Payer: Self-pay | Admitting: Adult Health

## 2018-10-04 DIAGNOSIS — E039 Hypothyroidism, unspecified: Secondary | ICD-10-CM | POA: Diagnosis not present

## 2018-10-04 DIAGNOSIS — F4321 Adjustment disorder with depressed mood: Secondary | ICD-10-CM | POA: Diagnosis not present

## 2018-10-04 DIAGNOSIS — M8580 Other specified disorders of bone density and structure, unspecified site: Secondary | ICD-10-CM | POA: Diagnosis not present

## 2018-10-04 NOTE — Telephone Encounter (Signed)
Spoke with the pharmacist at [facility] and I was able to give him a verbal for the Topamax. No other questions or concerns at this time.

## 2018-10-04 NOTE — Telephone Encounter (Signed)
Jamie @ [facility] is calling re: the topiramate (TOPAMAX) 100 MG tablet, she is asking if they can get a verbal for this prescription, they are having difficulty getting this script any other way

## 2018-10-24 DIAGNOSIS — E039 Hypothyroidism, unspecified: Secondary | ICD-10-CM | POA: Diagnosis not present

## 2018-10-24 DIAGNOSIS — F4321 Adjustment disorder with depressed mood: Secondary | ICD-10-CM | POA: Diagnosis not present

## 2018-10-24 DIAGNOSIS — M8580 Other specified disorders of bone density and structure, unspecified site: Secondary | ICD-10-CM | POA: Diagnosis not present

## 2018-11-26 ENCOUNTER — Other Ambulatory Visit: Payer: Self-pay

## 2018-11-26 ENCOUNTER — Ambulatory Visit (INDEPENDENT_AMBULATORY_CARE_PROVIDER_SITE_OTHER): Payer: PPO | Admitting: Adult Health

## 2018-11-26 ENCOUNTER — Encounter: Payer: Self-pay | Admitting: Adult Health

## 2018-11-26 VITALS — BP 152/78 | HR 66 | Temp 95.0°F | Ht 63.0 in | Wt 168.4 lb

## 2018-11-26 DIAGNOSIS — R569 Unspecified convulsions: Secondary | ICD-10-CM | POA: Diagnosis not present

## 2018-11-26 MED ORDER — TOPIRAMATE 100 MG PO TABS: ORAL_TABLET | ORAL | 3 refills | Status: DC

## 2018-11-26 NOTE — Progress Notes (Signed)
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  She returns today for follow-up.  She remains on Topamax taking 100 mg in the morning and 200 mg in the evening.  She reports that she has had 2 events over the last year.  The first event was December 24, 2017.  She states that it started off with a bad headache and then she could not remember names.  It lasted for approximately 1 hour.  She does note that stressful events typically caused her episodes.  She states 1 week prior she was bitten by her dog and had to go to her PCP and was put on antibiotics.  She states the second event was December 18 she states that she [place] her [place] [facility].  She states that she developed aphasia could not talk or understand what people are saying for approximately 30 minutes.  She states that 1 week prior her best friend had passed away.  Fortunately she is not had any episodes this year.  She states that she typically lets her husband drive.  If she does drive her husband is typically in the car with her.  She is able to complete all ADLs independently.  She returns today for follow-up.  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  She returns today for follow-up.  She states that she has not had any true seizure events in the last year.  She states that over the last year she has had 2 or 3 episodes of "not feeling quite right."  She states that she typically will wake up in the morning and she may find it difficult to read the newspaper or understand what is being said on television.  She reports that this may last minutes but no longer than 10 minutes.  She reports that this is very infrequent ( only 2-3  episodes over the last year).  She reports that she continues to tolerate Topamax well.  She states that she has been under a lot of stress over the last year with the death of her brother and becoming the sole caregiver for her father.  She returns today for evaluation.   REVIEW OF SYSTEMS: Out of a complete 14 system review of symptoms, the patient complains only of the following symptoms, and all other reviewed systems are negative.  See HPI  ALLERGIES: Allergies  Allergen Reactions   Moxifloxacin     Redness, non-healing   Latex Rash   Tape Rash    HOME MEDICATIONS: Outpatient Medications Prior to Visit  Medication Sig Dispense Refill   cholecalciferol (VITAMIN D) 1000 units tablet Take 1,000 Units by mouth daily.      clobetasol cream (TEMOVATE) 9.83 % Apply 1 application topically once a week.      famotidine (PEPCID) 20 MG tablet Take 20 mg by mouth 2 (two) times daily.  3   levothyroxine (SYNTHROID, LEVOTHROID) 25 MCG tablet TAKE 1 TABLET BY MOUTH EVERY DAY ON EMPTY STOMACH IN THE MORNING  5   LORazepam (ATIVAN) 1 MG tablet TAKE 1 TABLET BY MOUTH EVERYDAY AT BEDTIME  1   Probiotic Product (PROBIOTIC PO) Take by mouth.     topiramate (TOPAMAX) 100 MG  tablet TAKE 1 TABLET BY MOUTH IN THE MORNING AND THEN TAKE 2 TABLETS IN THE EVENING 270 tablet 1   valACYclovir (VALTREX) 500 MG tablet Take 500 mg by mouth daily.     No facility-administered medications prior to visit.     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   Herpes    IBS (irritable bowel syndrome)    Lichen sclerosus    Melanoma ([place])    Melanoma ([place])    Osteopenia    PE (pulmonary embolism)    Seizures ([place])    Stroke ([place])    Vitamin D deficiency     PAST SURGICAL HISTORY: Past Surgical History:  Procedure Laterality Date   APPENDECTOMY  1955    AUGMENTATION MAMMAPLASTY Bilateral 2440   silicone   BREAST ENHANCEMENT SURGERY  1975   CATARACT EXTRACTION  09/2004   left eye   CATARACT EXTRACTION  11/2007   right eye   [place]  07/1997   left shoulder   [place]   right leg    FAMILY HISTORY: Family History  Problem Relation Age of Onset   Heart attack Father    Diabetes Father    Atrial fibrillation Father    Heart attack Mother    Cancer Maternal Grandmother        esophageal   Lymphoma Brother     SOCIAL HISTORY: Social History   Socioeconomic History   Marital status: Married    Spouse name: Not on file   Number of children: 3   Years of education: 13   Highest education level: Not on file  Occupational History   Occupation: retired  Scientist, product/process development strain: Not on file   Food insecurity    Worry: Not on file    Inability: Not on Lexicographer needs    Medical: Not on file    Non-medical: Not on file  Tobacco Use   Smoking status: Former Smoker    Quit date: 09/26/1989    Years since quitting: 29.1   Smokeless tobacco: Never Used  Substance and Sexual Activity   Alcohol use: Yes    Alcohol/week: 14.0 standard drinks    Types: 14 Glasses of wine per week    Comment: 2 glasses wine pm   Drug use: No   Sexual activity: Not on file  Lifestyle   Physical activity    Days per week: Not on file    Minutes per session: Not on file   Stress: Not on file  Relationships   Social connections    Talks on phone: Not on file    Gets together: Not on file    Attends religious service: Not on file    Active member of club or organization: Not on file    Attends meetings of clubs or organizations: Not on file    Relationship status: Not on file   Intimate partner violence    Fear of current or ex partner: Not on file    Emotionally  abused: Not on file    Physically abused: Not on file    Forced sexual activity: Not on file  Other Topics Concern   Not on file  Social  History Narrative   Lives at home w/ her husband   Patient drinks 1-2 cups of caffeine daily.   Patient is right handed.      PHYSICAL EXAM  Vitals:   11/26/18 1008  BP: (!) 152/80  Pulse: 66  Temp: (!) 95 F (35 C)  TempSrc: Oral  Weight: 168 lb 6.4 oz (76.4 kg)  Height: 5\' 3"  (1.6 m)   Body mass index is 29.83 kg/m.  Generalized: Well developed, in no acute distress   Neurological examination  Mentation: Alert oriented to time, place, history taking. Follows all commands speech and language fluent Cranial nerve II-XII: Pupils were equal round reactive to light. Extraocular movements were full, visual field were full on confrontational test. Facial sensation and strength were normal. Head turning and shoulder shrug  were normal and symmetric. Motor: The motor testing reveals 5 over 5 strength of all 4 extremities. Good symmetric motor tone is noted throughout.  Sensory: Sensory testing is intact to soft touch on all 4 extremities. No evidence of extinction is noted.  Coordination: Cerebellar testing reveals good finger-nose-finger and heel-to-shin bilaterally.  Gait and station: Gait is normal. Tandem gait is normal. Romberg is negative. No drift is seen.  Reflexes: Deep tendon reflexes are symmetric and normal bilaterally.   DIAGNOSTIC DATA (LABS, IMAGING, TESTING) - I reviewed patient records, labs, notes, testing and imaging myself where available.  Lab Results  Component Value Date   WBC 4.6 09/01/2006   HGB 14.8 09/01/2006   HCT 43.1 09/01/2006   MCV 93.5 09/01/2006   PLT 222 09/01/2006      Component Value Date/Time   NA 138 09/01/2006 1405   K 3.7 09/01/2006 1405   CL 105 09/01/2006 1405   CO2 24 09/01/2006 1405   GLUCOSE 107 (H) 09/01/2006 1405   BUN 17 09/01/2006 1405   CREATININE 0.7 09/01/2006 1405   CALCIUM  9.6 09/01/2006 1405   PROT 6.9 09/01/2006 1405   ALBUMIN 4.2 09/01/2006 1405   AST 21 09/01/2006 1405   ALT 19 09/01/2006 1405   ALKPHOS 105 09/01/2006 1405   BILITOT 0.8 09/01/2006 1405   GFRNONAA 90 09/01/2006 1405   GFRAA 109 09/01/2006 1405   Lab Results  Component Value Date   CHOL 235 (HH) 09/01/2006   HDL 75.5 09/01/2006   LDLDIRECT 135.4 09/01/2006   TRIG 67 09/01/2006   CHOLHDL 3.1 CALC 09/01/2006   Lab Results  Component Value Date   HGBA1C 5.4 09/01/2006   Lab Results  Component Value Date   VITAMINB12 208 (L) 09/05/2006   No results found for: TSH    ASSESSMENT AND PLAN 78 y.o. year old female  has a past medical history of Bilateral ovarian cysts, Colitis, Clostridium difficile, Focal seizure ([place]) (11/07/2014), Herpes, IBS (irritable bowel syndrome), Lichen sclerosus, Melanoma ([place]), Melanoma ([place]), Osteopenia, PE (pulmonary embolism), Seizures ([place]), Stroke ([place]), and Vitamin D deficiency. here with:  1.  Seizures  Overall the patient has remained stable.  She typically has 2-3 episodes a year.  She has tried several other seizure medications in the past but was unable to tolerate them.  She will remain on Topamax taking 100 mg in the morning and 200 mg in the evening.  She is advised that if her symptoms worsen or she develops new symptoms she should let us know.  She will follow-up in 1 year or sooner if needed.  I spent 15 minutes with the patient. 50% of this time was spent reviewing medication  Ward Givens, MSN, NP-C 11/26/2018, 10:07 AM [facility] [address], [place], [place] [address] [phone]

## 2018-11-26 NOTE — Progress Notes (Signed)
I have read the note, and I agree with the clinical assessment and plan.  [name]   

## 2018-11-26 NOTE — Patient Instructions (Signed)
Continue Topamax If you have any seizure events please let us know.

## 2018-11-27 ENCOUNTER — Other Ambulatory Visit: Payer: Self-pay | Admitting: Adult Health

## 2018-11-27 ENCOUNTER — Other Ambulatory Visit: Payer: Self-pay | Admitting: Obstetrics and Gynecology

## 2018-11-27 DIAGNOSIS — Z8619 Personal history of other infectious and parasitic diseases: Secondary | ICD-10-CM | POA: Diagnosis not present

## 2018-11-27 DIAGNOSIS — Z9189 Other specified personal risk factors, not elsewhere classified: Secondary | ICD-10-CM | POA: Diagnosis not present

## 2018-11-27 DIAGNOSIS — N644 Mastodynia: Secondary | ICD-10-CM

## 2018-11-27 DIAGNOSIS — L900 Lichen sclerosus et atrophicus: Secondary | ICD-10-CM | POA: Diagnosis not present

## 2018-11-27 DIAGNOSIS — R102 Pelvic and perineal pain: Secondary | ICD-10-CM | POA: Diagnosis not present

## 2018-11-27 DIAGNOSIS — N952 Postmenopausal atrophic vaginitis: Secondary | ICD-10-CM | POA: Diagnosis not present

## 2018-11-27 DIAGNOSIS — N83202 Unspecified ovarian cyst, left side: Secondary | ICD-10-CM | POA: Diagnosis not present

## 2018-11-27 NOTE — Telephone Encounter (Signed)
Spoke to [employer] at [facility].  Pt was given 90 tablets, as were trying to have her insync to pick up all her prescriptions as 90 days on 12-26-18.  I renewed as 90 day with 3 refills,  Disregard 180 tablets on 11-26-18 prescription.

## 2018-12-03 ENCOUNTER — Other Ambulatory Visit: Payer: PPO

## 2018-12-06 DIAGNOSIS — R102 Pelvic and perineal pain: Secondary | ICD-10-CM | POA: Diagnosis not present

## 2018-12-06 DIAGNOSIS — N83202 Unspecified ovarian cyst, left side: Secondary | ICD-10-CM | POA: Diagnosis not present

## 2018-12-11 ENCOUNTER — Other Ambulatory Visit: Payer: Self-pay | Admitting: Obstetrics and Gynecology

## 2018-12-11 DIAGNOSIS — Z1231 Encounter for screening mammogram for malignant neoplasm of breast: Secondary | ICD-10-CM

## 2018-12-17 DIAGNOSIS — E039 Hypothyroidism, unspecified: Secondary | ICD-10-CM | POA: Diagnosis not present

## 2018-12-17 DIAGNOSIS — M8580 Other specified disorders of bone density and structure, unspecified site: Secondary | ICD-10-CM | POA: Diagnosis not present

## 2018-12-17 DIAGNOSIS — F4321 Adjustment disorder with depressed mood: Secondary | ICD-10-CM | POA: Diagnosis not present

## 2018-12-19 ENCOUNTER — Other Ambulatory Visit: Payer: PPO

## 2019-01-01 DIAGNOSIS — N898 Other specified noninflammatory disorders of vagina: Secondary | ICD-10-CM | POA: Diagnosis not present

## 2019-01-01 DIAGNOSIS — N762 Acute vulvitis: Secondary | ICD-10-CM | POA: Diagnosis not present

## 2019-01-09 DIAGNOSIS — N898 Other specified noninflammatory disorders of vagina: Secondary | ICD-10-CM | POA: Diagnosis not present

## 2019-01-09 DIAGNOSIS — N762 Acute vulvitis: Secondary | ICD-10-CM | POA: Diagnosis not present

## 2019-01-16 DIAGNOSIS — E039 Hypothyroidism, unspecified: Secondary | ICD-10-CM | POA: Diagnosis not present

## 2019-01-16 DIAGNOSIS — F4321 Adjustment disorder with depressed mood: Secondary | ICD-10-CM | POA: Diagnosis not present

## 2019-01-16 DIAGNOSIS — M8580 Other specified disorders of bone density and structure, unspecified site: Secondary | ICD-10-CM | POA: Diagnosis not present

## 2019-01-23 DIAGNOSIS — N763 Subacute and chronic vulvitis: Secondary | ICD-10-CM | POA: Diagnosis not present

## 2019-01-28 ENCOUNTER — Ambulatory Visit
Admission: RE | Admit: 2019-01-28 | Discharge: 2019-01-28 | Disposition: A | Discharge status: Home / Self Care | Payer: PPO | Source: Ambulatory Visit | Attending: Obstetrics and Gynecology | Admitting: Obstetrics and Gynecology

## 2019-01-28 ENCOUNTER — Other Ambulatory Visit: Payer: Self-pay

## 2019-01-28 DIAGNOSIS — Z1231 Encounter for screening mammogram for malignant neoplasm of breast: Secondary | ICD-10-CM | POA: Diagnosis not present

## 2019-01-31 DIAGNOSIS — H02055 Trichiasis without entropian left lower eyelid: Secondary | ICD-10-CM | POA: Diagnosis not present

## 2019-01-31 DIAGNOSIS — Z961 Presence of intraocular lens: Secondary | ICD-10-CM | POA: Diagnosis not present

## 2019-02-13 DIAGNOSIS — M8580 Other specified disorders of bone density and structure, unspecified site: Secondary | ICD-10-CM | POA: Diagnosis not present

## 2019-02-13 DIAGNOSIS — F4321 Adjustment disorder with depressed mood: Secondary | ICD-10-CM | POA: Diagnosis not present

## 2019-02-13 DIAGNOSIS — E039 Hypothyroidism, unspecified: Secondary | ICD-10-CM | POA: Diagnosis not present

## 2019-02-21 DIAGNOSIS — R05 Cough: Secondary | ICD-10-CM | POA: Diagnosis not present

## 2019-03-05 DIAGNOSIS — F4321 Adjustment disorder with depressed mood: Secondary | ICD-10-CM | POA: Diagnosis not present

## 2019-03-05 DIAGNOSIS — M8580 Other specified disorders of bone density and structure, unspecified site: Secondary | ICD-10-CM | POA: Diagnosis not present

## 2019-03-05 DIAGNOSIS — E039 Hypothyroidism, unspecified: Secondary | ICD-10-CM | POA: Diagnosis not present

## 2019-03-18 DIAGNOSIS — H02831 Dermatochalasis of right upper eyelid: Secondary | ICD-10-CM | POA: Diagnosis not present

## 2019-03-18 DIAGNOSIS — H02834 Dermatochalasis of left upper eyelid: Secondary | ICD-10-CM | POA: Diagnosis not present

## 2019-03-21 DIAGNOSIS — H02834 Dermatochalasis of left upper eyelid: Secondary | ICD-10-CM | POA: Diagnosis not present

## 2019-03-21 DIAGNOSIS — H02831 Dermatochalasis of right upper eyelid: Secondary | ICD-10-CM | POA: Diagnosis not present

## 2019-03-26 DIAGNOSIS — K219 Gastro-esophageal reflux disease without esophagitis: Secondary | ICD-10-CM | POA: Diagnosis not present

## 2019-03-26 DIAGNOSIS — K589 Irritable bowel syndrome without diarrhea: Secondary | ICD-10-CM | POA: Diagnosis not present

## 2019-03-26 DIAGNOSIS — K591 Functional diarrhea: Secondary | ICD-10-CM | POA: Diagnosis not present

## 2019-03-28 DIAGNOSIS — L900 Lichen sclerosus et atrophicus: Secondary | ICD-10-CM | POA: Diagnosis not present

## 2019-03-28 DIAGNOSIS — Z08 Encounter for follow-up examination after completed treatment for malignant neoplasm: Secondary | ICD-10-CM | POA: Diagnosis not present

## 2019-03-28 DIAGNOSIS — L821 Other seborrheic keratosis: Secondary | ICD-10-CM | POA: Diagnosis not present

## 2019-03-28 DIAGNOSIS — L249 Irritant contact dermatitis, unspecified cause: Secondary | ICD-10-CM | POA: Diagnosis not present

## 2019-03-28 DIAGNOSIS — Z85820 Personal history of malignant melanoma of skin: Secondary | ICD-10-CM | POA: Diagnosis not present

## 2019-04-17 DIAGNOSIS — N760 Acute vaginitis: Secondary | ICD-10-CM | POA: Diagnosis not present

## 2019-04-17 DIAGNOSIS — L900 Lichen sclerosus et atrophicus: Secondary | ICD-10-CM | POA: Diagnosis not present

## 2019-04-17 DIAGNOSIS — N898 Other specified noninflammatory disorders of vagina: Secondary | ICD-10-CM | POA: Diagnosis not present

## 2019-04-17 DIAGNOSIS — R350 Frequency of micturition: Secondary | ICD-10-CM | POA: Diagnosis not present

## 2019-04-19 DIAGNOSIS — Z8616 Personal history of COVID-19: Secondary | ICD-10-CM

## 2019-04-19 HISTORY — DX: Personal history of COVID-19: Z86.16

## 2019-05-01 ENCOUNTER — Other Ambulatory Visit: Admission: RE | Admit: 2019-05-01 | Payer: PPO | Source: Ambulatory Visit

## 2019-05-03 ENCOUNTER — Ambulatory Visit: Admit: 2019-05-03 | Payer: PPO | Admitting: Ophthalmology

## 2019-05-03 SURGERY — BLEPHAROPLASTY
Anesthesia: Monitor Anesthesia Care | Laterality: Bilateral

## 2019-05-10 DIAGNOSIS — K589 Irritable bowel syndrome without diarrhea: Secondary | ICD-10-CM | POA: Diagnosis not present

## 2019-05-10 DIAGNOSIS — Z1389 Encounter for screening for other disorder: Secondary | ICD-10-CM | POA: Diagnosis not present

## 2019-05-10 DIAGNOSIS — Z0000 Encounter for general adult medical examination without abnormal findings: Secondary | ICD-10-CM | POA: Diagnosis not present

## 2019-05-10 DIAGNOSIS — R739 Hyperglycemia, unspecified: Secondary | ICD-10-CM | POA: Diagnosis not present

## 2019-05-10 DIAGNOSIS — E7800 Pure hypercholesterolemia, unspecified: Secondary | ICD-10-CM | POA: Diagnosis not present

## 2019-05-10 DIAGNOSIS — K219 Gastro-esophageal reflux disease without esophagitis: Secondary | ICD-10-CM | POA: Diagnosis not present

## 2019-05-10 DIAGNOSIS — F4321 Adjustment disorder with depressed mood: Secondary | ICD-10-CM | POA: Diagnosis not present

## 2019-05-10 DIAGNOSIS — Z85820 Personal history of malignant melanoma of skin: Secondary | ICD-10-CM | POA: Diagnosis not present

## 2019-05-14 DIAGNOSIS — N949 Unspecified condition associated with female genital organs and menstrual cycle: Secondary | ICD-10-CM | POA: Diagnosis not present

## 2019-05-14 DIAGNOSIS — N760 Acute vaginitis: Secondary | ICD-10-CM | POA: Diagnosis not present

## 2019-05-14 DIAGNOSIS — L900 Lichen sclerosus et atrophicus: Secondary | ICD-10-CM | POA: Diagnosis not present

## 2019-05-30 ENCOUNTER — Other Ambulatory Visit: Payer: Self-pay

## 2019-05-30 ENCOUNTER — Encounter: Payer: Self-pay | Admitting: Ophthalmology

## 2019-06-05 ENCOUNTER — Other Ambulatory Visit
Admission: RE | Admit: 2019-06-05 | Discharge: 2019-06-05 | Disposition: A | Discharge status: Home / Self Care | Payer: PPO | Source: Ambulatory Visit | Attending: Podiatry | Admitting: Podiatry

## 2019-06-05 DIAGNOSIS — Z20822 Contact with and (suspected) exposure to covid-19: Secondary | ICD-10-CM | POA: Insufficient documentation

## 2019-06-05 DIAGNOSIS — Z01812 Encounter for preprocedural laboratory examination: Secondary | ICD-10-CM | POA: Diagnosis not present

## 2019-06-05 NOTE — Discharge Instructions (Signed)
INSTRUCTIONS FOLLOWING OCULOPLASTIC SURGERY AMY M. FOWLER, MD  AFTER YOUR EYE SURGERY, THER ARE MANY THINGS WHICH YOU, THE PATIENT, CAN DO TO ASSURE THE BEST POSSIBLE RESULT FROM YOUR OPERATION.  THIS SHEET SHOULD BE REFERRED TO WHENEVER QUESTIONS ARISE.  IF THERE ARE ANY QUESTIONS NOT ANSWERED HERE, DO NOT HESITATE TO CALL OUR OFFICE AT [phone] OR [phone].  THERE IS ALWAYS SOMEONE AVAILABLE TO CALL IF QUESTIONS OR PROBLEMS ARISE.  VISION: Your vision may be blurred and out of focus after surgery until you are able to stop using your ointment, swelling resolves and your eye(s) heal. This may take 1 to 2 weeks at the least.  If your vision becomes gradually more dim or dark, this is not normal and you need to call our office immediately.  EYE CARE: For the first 48 hours after surgery, use ice packs frequently - "20 minutes on, 20 minutes off" - to help reduce swelling and bruising.  Small bags of frozen peas or corn make good ice packs along with cloths soaked in ice water.  If you are wearing a patch or other type of dressing following surgery, keep this on for the amount of time specified by your doctor.  For the first week following surgery, you will need to treat your stitches with great care.  It is OK to shower, but take care to not allow soapy water to run into your eye(s) to help reduce chances of infection.  You may gently clean the eyelashes and around the eye(s) with cotton balls and sterile water, BUT DO NOT RUB THE STITCHES VIGOROUSLY.  Keeping your stitches moist with ointment will help promote healing with minimal scar formation.  ACTIVITY: When you leave the surgery center, you should go home, rest and be inactive.  The eye(s) may feel scratchy and keeping the eyes closed will allow for faster healing.  The first week following surgery, avoid straining (anything making the face turn red) or lifting over 20 pounds.  Additionally, avoid bending which causes your head to go below  your waist.  Using your eyes will NOT harm them, so feel free to read, watch television, use the computer, etc as desired.  Driving depends on each individual, so check with your doctor if you have questions about driving. Do not wear contact lenses for about 2 weeks.  Do not wear eye makeup for 2 weeks.  Avoid swimming, hot tubs, gardening, and dusting for 1 to 2 weeks to reduce the risk of an infection.  MEDICATIONS:  You will be given a prescription for an ointment to use 4 times a day on your stitches.  You can use the ointment in your eyes if they feel scratchy or irritated.  If you eyelid(s) don't close completely when you sleep, put some ointment in your eyes before bedtime.  EMERGENCY: If you experience SEVERE EYE PAIN OR HEADACHE UNRELIEVED BY TYLENOL OR PERCOCET, NAUSEA OR VOMITING, WORSENING REDNESS, OR WORSENING VISION (ESPECIALLY VISION THAT WAS INITIALLY BETTER) CALL [phone] OR [phone] DURING BUSINESS HOURS OR AFTER HOURS.  General Anesthesia, Adult, Care After This sheet gives you information about how to care for yourself after your procedure. Your health care provider may also give you more specific instructions. If you have problems or questions, contact your health care provider. What can I expect after the procedure? After the procedure, the following side effects are common:  Pain or discomfort at the IV site.  Nausea.  Vomiting.  Sore throat.  Trouble concentrating.    Feeling cold or chills.  Weak or tired.  Sleepiness and fatigue.  Soreness and body aches. These side effects can affect parts of the body that were not involved in surgery. Follow these instructions at home:  For at least 24 hours after the procedure:  Have a responsible adult stay with you. It is important to have someone help care for you until you are awake and alert.  Rest as needed.  Do not: ? Participate in activities in which you could fall or become injured. ? Drive. ? Use  heavy machinery. ? Drink alcohol. ? Take sleeping pills or medicines that cause drowsiness. ? Make important decisions or sign legal documents. ? Take care of children on your own. Eating and drinking  Follow any instructions from your health care provider about eating or drinking restrictions.  When you feel hungry, start by eating small amounts of foods that are soft and easy to digest (bland), such as toast. Gradually return to your regular diet.  Drink enough fluid to keep your urine pale yellow.  If you vomit, rehydrate by drinking water, juice, or clear broth. General instructions  If you have sleep apnea, surgery and certain medicines can increase your risk for breathing problems. Follow instructions from your health care provider about wearing your sleep device: ? Anytime you are sleeping, including during daytime naps. ? While taking prescription pain medicines, sleeping medicines, or medicines that make you drowsy.  Return to your normal activities as told by your health care provider. Ask your health care provider what activities are safe for you.  Take over-the-counter and prescription medicines only as told by your health care provider.  If you smoke, do not smoke without supervision.  Keep all follow-up visits as told by your health care provider. This is important. Contact a health care provider if:  You have nausea or vomiting that does not get better with medicine.  You cannot eat or drink without vomiting.  You have pain that does not get better with medicine.  You are unable to pass urine.  You develop a skin rash.  You have a fever.  You have redness around your IV site that gets worse. Get help right away if:  You have difficulty breathing.  You have chest pain.  You have blood in your urine or stool, or you vomit blood. Summary  After the procedure, it is common to have a sore throat or nausea. It is also common to feel tired.  Have a  responsible adult stay with you for the first 24 hours after general anesthesia. It is important to have someone help care for you until you are awake and alert.  When you feel hungry, start by eating small amounts of foods that are soft and easy to digest (bland), such as toast. Gradually return to your regular diet.  Drink enough fluid to keep your urine pale yellow.  Return to your normal activities as told by your health care provider. Ask your health care provider what activities are safe for you. This information is not intended to replace advice given to you by your health care provider. Make sure you discuss any questions you have with your health care provider. Document Revised: 04/07/2017 Document Reviewed: 11/18/2016 Elsevier Patient Education  2020 Elsevier Inc.  

## 2019-06-06 LAB — SARS CORONAVIRUS 2 (TAT 6-24 HRS): SARS Coronavirus 2: NEGATIVE

## 2019-06-06 NOTE — Anesthesia Preprocedure Evaluation (Addendum)
Anesthesia Evaluation  Patient identified by MRN, date of birth, ID band Patient awake    Reviewed: Allergy & Precautions, NPO status , Patient's Chart, lab work & pertinent test results  History of Anesthesia Complications Negative for: history of anesthetic complications  Airway Mallampati: III  TM Distance: >3 FB Neck ROM: Limited    Dental   Pulmonary former smoker,    breath sounds clear to auscultation       Cardiovascular (-) angina(-) DOE  Rhythm:Regular Rate:Normal     Neuro/Psych Seizures -, Well Controlled,  CVA (1996, speech difficulties, now improved)    GI/Hepatic neg GERD  , IBS   Endo/Other    Renal/GU      Musculoskeletal   Abdominal   Peds  Hematology  H/o PE   Anesthesia Other Findings Melanoma  Reproductive/Obstetrics                            Anesthesia Physical Anesthesia Plan  ASA: III  Anesthesia Plan: MAC   Post-op Pain Management:    Induction: Intravenous  PONV Risk Score and Plan: 2 and TIVA, Midazolam and Treatment may vary due to age or medical condition  Airway Management Planned: Nasal Cannula  Additional Equipment:   Intra-op Plan:   Post-operative Plan:   Informed Consent: I have reviewed the patients History and Physical, chart, labs and discussed the procedure including the risks, benefits and alternatives for the proposed anesthesia with the patient or authorized representative who has indicated his/her understanding and acceptance.       Plan Discussed with: CRNA and Anesthesiologist  Anesthesia Plan Comments:        Anesthesia Quick Evaluation

## 2019-06-07 ENCOUNTER — Encounter
Admission: RE | Disposition: A | Discharge status: Home / Self Care | Payer: Self-pay | Source: Home / Self Care | Attending: Ophthalmology

## 2019-06-07 ENCOUNTER — Ambulatory Visit
Admission: RE | Admit: 2019-06-07 | Discharge: 2019-06-07 | Disposition: A | Discharge status: Home / Self Care | Payer: PPO | Attending: Ophthalmology | Admitting: Ophthalmology

## 2019-06-07 ENCOUNTER — Ambulatory Visit: Payer: PPO | Admitting: Anesthesiology

## 2019-06-07 ENCOUNTER — Other Ambulatory Visit: Payer: Self-pay

## 2019-06-07 ENCOUNTER — Encounter: Payer: Self-pay | Admitting: Ophthalmology

## 2019-06-07 DIAGNOSIS — K589 Irritable bowel syndrome without diarrhea: Secondary | ICD-10-CM | POA: Diagnosis not present

## 2019-06-07 DIAGNOSIS — H02831 Dermatochalasis of right upper eyelid: Secondary | ICD-10-CM | POA: Insufficient documentation

## 2019-06-07 DIAGNOSIS — Z91048 Other nonmedicinal substance allergy status: Secondary | ICD-10-CM | POA: Diagnosis not present

## 2019-06-07 DIAGNOSIS — Z881 Allergy status to other antibiotic agents status: Secondary | ICD-10-CM | POA: Diagnosis not present

## 2019-06-07 DIAGNOSIS — H02834 Dermatochalasis of left upper eyelid: Secondary | ICD-10-CM | POA: Diagnosis not present

## 2019-06-07 DIAGNOSIS — R569 Unspecified convulsions: Secondary | ICD-10-CM | POA: Insufficient documentation

## 2019-06-07 DIAGNOSIS — H02833 Dermatochalasis of right eye, unspecified eyelid: Secondary | ICD-10-CM | POA: Diagnosis present

## 2019-06-07 DIAGNOSIS — Z86711 Personal history of pulmonary embolism: Secondary | ICD-10-CM | POA: Diagnosis not present

## 2019-06-07 DIAGNOSIS — Z888 Allergy status to other drugs, medicaments and biological substances status: Secondary | ICD-10-CM | POA: Insufficient documentation

## 2019-06-07 DIAGNOSIS — I69328 Other speech and language deficits following cerebral infarction: Secondary | ICD-10-CM | POA: Diagnosis not present

## 2019-06-07 DIAGNOSIS — Z85820 Personal history of malignant melanoma of skin: Secondary | ICD-10-CM | POA: Insufficient documentation

## 2019-06-07 DIAGNOSIS — Z87891 Personal history of nicotine dependence: Secondary | ICD-10-CM | POA: Diagnosis not present

## 2019-06-07 DIAGNOSIS — Z882 Allergy status to sulfonamides status: Secondary | ICD-10-CM | POA: Diagnosis not present

## 2019-06-07 DIAGNOSIS — Z91040 Latex allergy status: Secondary | ICD-10-CM | POA: Diagnosis not present

## 2019-06-07 HISTORY — PX: BROW LIFT: SHX178

## 2019-06-07 SURGERY — BLEPHAROPLASTY
Anesthesia: Monitor Anesthesia Care | Site: Eye | Laterality: Bilateral

## 2019-06-07 MED ORDER — PROPOFOL 500 MG/50ML IV EMUL
INTRAVENOUS | Status: DC | PRN
  Administered 2019-06-07: 50 ug/kg/min via INTRAVENOUS

## 2019-06-07 MED ORDER — TRAMADOL HCL 50 MG PO TABS: ORAL_TABLET | ORAL | 0 refills | Status: DC

## 2019-06-07 MED ORDER — ACETAMINOPHEN 10 MG/ML IV SOLN: 1000.0000 mg | Freq: Once | INTRAVENOUS | Status: DC | PRN

## 2019-06-07 MED ORDER — TETRACAINE HCL 0.5 % OP SOLN
OPHTHALMIC | Status: DC | PRN
  Administered 2019-06-07: 2 [drp] via OPHTHALMIC

## 2019-06-07 MED ORDER — ALFENTANIL 500 MCG/ML IJ INJ
INJECTION | INTRAVENOUS | Status: DC | PRN
  Administered 2019-06-07: 500 ug via INTRAVENOUS
  Administered 2019-06-07 (×2): 250 ug via INTRAVENOUS

## 2019-06-07 MED ORDER — BSS IO SOLN
INTRAOCULAR | Status: DC | PRN
  Administered 2019-06-07: 15 mL

## 2019-06-07 MED ORDER — BACITRACIN 500 UNIT/GM OP OINT
TOPICAL_OINTMENT | OPHTHALMIC | Status: DC | PRN
  Administered 2019-06-07: 1 via OPHTHALMIC

## 2019-06-07 MED ORDER — LACTATED RINGERS IV SOLN: INTRAVENOUS | Status: DC | PRN

## 2019-06-07 MED ORDER — LACTATED RINGERS IV SOLN: 100.0000 mL/h | INTRAVENOUS | Status: DC

## 2019-06-07 MED ORDER — MIDAZOLAM HCL 2 MG/2ML IJ SOLN
INTRAMUSCULAR | Status: DC | PRN
  Administered 2019-06-07: 2 mg via INTRAVENOUS

## 2019-06-07 MED ORDER — LIDOCAINE-EPINEPHRINE 2 %-1:100000 IJ SOLN
INTRAMUSCULAR | Status: DC | PRN
  Administered 2019-06-07: 10:00:00 1.5 mL via OPHTHALMIC

## 2019-06-07 MED ORDER — BACITRACIN 500 UNIT/GM OP OINT: TOPICAL_OINTMENT | OPHTHALMIC | 3 refills | Status: DC

## 2019-06-07 MED ORDER — ONDANSETRON HCL 4 MG/2ML IJ SOLN: 4.0000 mg | Freq: Once | INTRAMUSCULAR | Status: DC | PRN

## 2019-06-07 SURGICAL SUPPLY — 42 items
APL FBRTP 3 NS LF CTTN WD (MISCELLANEOUS) ×10
APPLICATOR COTTON TIP 3IN (MISCELLANEOUS) ×20 IMPLANT
BLADE SURG 15 STRL LF DISP TIS (BLADE) ×1 IMPLANT
BLADE SURG 15 STRL SS (BLADE) ×3
CORD BIP STRL DISP 12FT (MISCELLANEOUS) ×3 IMPLANT
DRAPE HEAD BAR (DRAPES) ×3 IMPLANT
GAUZE SPONGE 4X4 12PLY STRL (GAUZE/BANDAGES/DRESSINGS) ×3 IMPLANT
GLOVE BIOGEL PI IND STRL 6.5 (GLOVE) IMPLANT
GLOVE BIOGEL PI IND STRL 7.0 (GLOVE) IMPLANT
GLOVE BIOGEL PI INDICATOR 6.5 (GLOVE) ×4
GLOVE BIOGEL PI INDICATOR 7.0 (GLOVE) ×4
GOWN STRL REUS W/ TWL LRG LVL3 (GOWN DISPOSABLE) ×1 IMPLANT
GOWN STRL REUS W/TWL LRG LVL3 (GOWN DISPOSABLE) ×6
MARKER SKIN XFINE TIP W/RULER (MISCELLANEOUS) ×3 IMPLANT
NDL FILTER BLUNT 18X1 1/2 (NEEDLE) ×1 IMPLANT
NDL HYPO 30X.5 LL (NEEDLE) ×2 IMPLANT
NEEDLE FILTER BLUNT 18X 1/2SAF (NEEDLE) ×2
NEEDLE FILTER BLUNT 18X1 1/2 (NEEDLE) ×1 IMPLANT
NEEDLE HYPO 30X.5 LL (NEEDLE) ×6 IMPLANT
PACK ENT CUSTOM (PACKS) ×3 IMPLANT
SOL PREP PVP 2OZ (MISCELLANEOUS) ×3
SOLUTION PREP PVP 2OZ (MISCELLANEOUS) ×1 IMPLANT
SPONGE GAUZE 2X2 8PLY STER LF (GAUZE/BANDAGES/DRESSINGS) ×10
SPONGE GAUZE 2X2 8PLY STRL LF (GAUZE/BANDAGES/DRESSINGS) ×20 IMPLANT
SUT CHROMIC 4-0 (SUTURE)
SUT CHROMIC 4-0 M2 12X2 ARM (SUTURE)
SUT CHROMIC 5 0 P 3 (SUTURE) IMPLANT
SUT ETHILON 4 0 CL P 3 (SUTURE) IMPLANT
SUT GUT PLAIN 6-0 1X18 ABS (SUTURE) ×3 IMPLANT
SUT MERSILENE 4-0 S-2 (SUTURE) IMPLANT
SUT PROLENE 5 0 P 3 (SUTURE) IMPLANT
SUT PROLENE 6 0 P 1 18 (SUTURE) IMPLANT
SUT SILK 4 0 G 3 (SUTURE) IMPLANT
SUT VIC AB 5-0 P-3 18X BRD (SUTURE) IMPLANT
SUT VIC AB 5-0 P3 18 (SUTURE)
SUT VICRYL 6-0  S14 CTD (SUTURE)
SUT VICRYL 6-0 S14 CTD (SUTURE) IMPLANT
SUT VICRYL 7 0 TG140 8 (SUTURE) IMPLANT
SUTURE CHRMC 4-0 M2 12X2 ARM (SUTURE) IMPLANT
SYR 10ML LL (SYRINGE) ×3 IMPLANT
SYR 3ML LL SCALE MARK (SYRINGE) ×3 IMPLANT
WATER STERILE IRR 250ML POUR (IV SOLUTION) ×3 IMPLANT

## 2019-06-07 NOTE — Interval H&P Note (Signed)
History and Physical Interval Note:  06/07/2019 9:20 AM  [name]  has presented today for surgery, with the diagnosis of h02.831 DERMATOCHALASIS OF RIGHT UPPER EYELID H02.834 DERMATOCHALASIS OF LEFT UPPER EYELID.  The various methods of treatment have been discussed with the patient and family. After consideration of risks, benefits and other options for treatment, the patient has consented to  Procedure(s): BLEPHAROPLASTY UPPER EYELID WITH EXCESS SKIN (Bilateral) as a surgical intervention.  The patient's history has been reviewed, patient examined, no change in status, stable for surgery.  I have reviewed the patient's chart and labs.  Questions were answered to the patient's satisfaction.     [name], [name]

## 2019-06-07 NOTE — Transfer of Care (Signed)
Immediate Anesthesia Transfer of Care Note  Patient: [name]  Procedure(s) Performed: BLEPHAROPLASTY UPPER EYELID WITH EXCESS SKIN (Bilateral Eye)  Patient Location: PACU  Anesthesia Type: MAC  Level of Consciousness: awake, alert  and patient cooperative  Airway and Oxygen Therapy: Patient Spontanous Breathing and Patient connected to supplemental oxygen  Post-op Assessment: Post-op Vital signs reviewed, Patient's Cardiovascular Status Stable, Respiratory Function Stable, Patent Airway and No signs of Nausea or vomiting  Post-op Vital Signs: Reviewed and stable  Complications: No apparent anesthesia complications

## 2019-06-07 NOTE — H&P (Signed)
See the history and physical completed at [facility] on 05/13/2019 and scanned into the chart.

## 2019-06-07 NOTE — Anesthesia Procedure Notes (Signed)
Procedure Name: [place] Performed by: Cameron Ali, CRNA Pre-anesthesia Checklist: Patient identified, Emergency Drugs available, Suction available, Timeout performed and Patient being monitored Patient Re-evaluated:Patient Re-evaluated prior to induction Oxygen Delivery Method: Nasal cannula Placement Confirmation: positive ETCO2

## 2019-06-07 NOTE — Op Note (Signed)
Preoperative Diagnosis:  Visually significant dermatochalasis bilateral  Upper Eyelid(s)  Postoperative Diagnosis:  Same.  Procedure(s) Performed:   Upper eyelid blepharoplasty with excess skin excision  bilateral  Upper Eyelid(s)  Surgeon: Philis Pique. Vickki Muff, M.D.  Assistants: none  Anesthesia: MAC  Specimens: None.  Estimated Blood Loss: Minimal.  Complications: None.  Operative Findings: None Dictated  Procedure:   Allergies were reviewed and the patient is allergic to Citalopram, Augmentin [amoxicillin-pot clavulanate], Doxycycline, Moxifloxacin, Latex, Septra [sulfamethoxazole-trimethoprim], Tape, and Zithromax [azithromycin].   After the risks, benefits, complications and alternatives were discussed with the patient, appropriate informed consent was obtained and the patient was brought to the operating suite. The patient was reclined supine and a timeout was conducted.  The patient was then sedated.  Local anesthetic consisting of a 50-50 mixture of 2% lidocaine with epinephrine and 0.75% bupivacaine with added Hylenex was injected subcutaneously to both  upper eyelid(s). After adequate local was instilled, the patient was prepped and draped in the usual sterile fashion for eyelid surgery.   Attention was turned to the upper eyelids. A 78m upper eyelid crease incision line was marked with calipers on both  upper eyelid(s).  A pinch test was used to estimate the amount of excess skin to remove and this was marked in standard blepharoplasty style fashion. Attention was turned to the  right  upper eyelid. A #15 blade was used to open the premarked incision line. A Skin only flap was excised and hemostasis was obtained with bipolar cautery.   Attention was then turned to the opposite eyelid where the same procedure was performed in the same manner. Hemostasis was obtained with bipolar cautery throughout. All incisions were then closed with a combination of running and interrupted 6-0  fast absorbing plain suture. The patient tolerated the procedure well.  Bacitracin ophthalmic Ophthalmic ointment was applied to her incision sites, followed by ice packs. She was taken to the recovery area where she recovered without difficulty.  Post-Op Plan/Instructions:  The patient was instructed to use ice packs frequently for the next 48 hours. She was instructed to use Bacitracin ophthalmic ophthalmic ointment on her incisions 4 times a day for the next 12 to 14 days. She was given a prescription for tramadol (or similar) for pain control should Tylenol not be effective. She was asked to to [facility] as needed for problems.  Madai Nuccio M. FVickki Muff M.D. Ophthalmology

## 2019-06-07 NOTE — Anesthesia Postprocedure Evaluation (Signed)
Anesthesia Post Note  Patient: [name]  Procedure(s) Performed: BLEPHAROPLASTY UPPER EYELID WITH EXCESS SKIN (Bilateral Eye)     Patient location during evaluation: PACU Anesthesia Type: General Level of consciousness: awake and alert Pain management: pain level controlled Vital Signs Assessment: post-procedure vital signs reviewed and stable Respiratory status: spontaneous breathing, nonlabored ventilation, respiratory function stable and patient connected to nasal cannula oxygen Cardiovascular status: blood pressure returned to baseline and stable Postop Assessment: no apparent nausea or vomiting Anesthetic complications: no    Gerard Bonus A  [place]

## 2019-06-10 ENCOUNTER — Encounter: Payer: Self-pay | Admitting: *Deleted

## 2019-06-27 DIAGNOSIS — N952 Postmenopausal atrophic vaginitis: Secondary | ICD-10-CM | POA: Diagnosis not present

## 2019-06-27 DIAGNOSIS — N760 Acute vaginitis: Secondary | ICD-10-CM | POA: Diagnosis not present

## 2019-06-27 DIAGNOSIS — L900 Lichen sclerosus et atrophicus: Secondary | ICD-10-CM | POA: Diagnosis not present

## 2019-08-06 DIAGNOSIS — N760 Acute vaginitis: Secondary | ICD-10-CM | POA: Diagnosis not present

## 2019-08-06 DIAGNOSIS — N952 Postmenopausal atrophic vaginitis: Secondary | ICD-10-CM | POA: Diagnosis not present

## 2019-09-10 DIAGNOSIS — N898 Other specified noninflammatory disorders of vagina: Secondary | ICD-10-CM | POA: Diagnosis not present

## 2019-09-10 DIAGNOSIS — L900 Lichen sclerosus et atrophicus: Secondary | ICD-10-CM | POA: Diagnosis not present

## 2019-09-10 DIAGNOSIS — N952 Postmenopausal atrophic vaginitis: Secondary | ICD-10-CM | POA: Diagnosis not present

## 2019-09-26 DIAGNOSIS — D2261 Melanocytic nevi of right upper limb, including shoulder: Secondary | ICD-10-CM | POA: Diagnosis not present

## 2019-09-26 DIAGNOSIS — L309 Dermatitis, unspecified: Secondary | ICD-10-CM | POA: Diagnosis not present

## 2019-09-26 DIAGNOSIS — D225 Melanocytic nevi of trunk: Secondary | ICD-10-CM | POA: Diagnosis not present

## 2019-09-26 DIAGNOSIS — D2271 Melanocytic nevi of right lower limb, including hip: Secondary | ICD-10-CM | POA: Diagnosis not present

## 2019-09-26 DIAGNOSIS — D2262 Melanocytic nevi of left upper limb, including shoulder: Secondary | ICD-10-CM | POA: Diagnosis not present

## 2019-09-26 DIAGNOSIS — N763 Subacute and chronic vulvitis: Secondary | ICD-10-CM | POA: Diagnosis not present

## 2019-09-26 DIAGNOSIS — Z85820 Personal history of malignant melanoma of skin: Secondary | ICD-10-CM | POA: Diagnosis not present

## 2019-11-06 DIAGNOSIS — K219 Gastro-esophageal reflux disease without esophagitis: Secondary | ICD-10-CM | POA: Diagnosis not present

## 2019-11-27 ENCOUNTER — Encounter: Payer: Self-pay | Admitting: Adult Health

## 2019-11-27 ENCOUNTER — Ambulatory Visit: Payer: PPO | Admitting: Adult Health

## 2019-11-27 VITALS — BP 147/73 | HR 68 | Ht 63.0 in | Wt 166.0 lb

## 2019-11-27 DIAGNOSIS — R569 Unspecified convulsions: Secondary | ICD-10-CM | POA: Diagnosis not present

## 2019-11-27 DIAGNOSIS — H539 Unspecified visual disturbance: Secondary | ICD-10-CM

## 2019-11-27 NOTE — Patient Instructions (Signed)
Your Plan:  Continue Topamax Follow-up with opthalmology  If your symptoms worsen or you develop new symptoms please let us know.    Thank you for coming to see [place] at [facility]. I hope we have been able to provide you high quality care today.  You may receive a patient satisfaction survey over the next few weeks. We would appreciate your feedback and comments so that we may continue to improve ourselves and the health of our patients.

## 2019-11-27 NOTE — Progress Notes (Signed)
PATIENT: [name] DOB: 04/19/1940  REASON FOR VISIT: follow up HISTORY FROM: patient  HISTORY OF PRESENT ILLNESS: Today 11/27/19:  Ms. [name] is a 79 year old female with a history of left hemorrhagic stroke and subsequent seizures.  She returns today for follow-up.  She remains on Topamax.  She states that she has had 3 events since January.  She reports that these events were slightly different.  She states that they presented with black spots in front of her vision in both visual fields.  She states that this lasted approximately 10 minutes.  Denies these events being followed by headache.  During this time she did not have any trouble speaking.  No weakness in the arms or legs.  She states that this happened again in March and then again in April.  She is not had any subsequent events.  She states that she has been under a lot of stress over the last year.  Reports that her dad passed away and then her son unexpectedly passed.  She has a follow-up with ophthalmology next month and plans to discuss the symptoms as well.  She denies any additional strokelike symptoms.  She returns today for an evaluation.  HISTORY 11/26/18:  Ms. 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  She returns today for follow-up.  She remains on Topamax taking 100 mg in the morning and 200 mg in the evening.  She reports that she has had 2 events over the last year.  The first event was December 24, 2017.  She states that it started off with a bad headache and then she could not remember names.  It lasted for approximately 1 hour.  She does note that stressful events typically caused her episodes.  She states 1 week prior she was bitten by her dog and had to go to her PCP and was put on antibiotics.  She states the second event was December 18 she states that she [facility].  She states that she developed aphasia  could not talk or understand what people are saying for approximately 30 minutes.  She states that 1 week prior her best friend had passed away.  Fortunately she is not had any episodes this year.  She states that she typically lets her husband drive.  If she does drive her husband is typically in the car with her.  She is able to complete all ADLs independently.  She returns today for follow-up.  REVIEW OF SYSTEMS: Out of a complete 14 system review of symptoms, the patient complains only of the following symptoms, and all other reviewed systems are negative.  See HPI  ALLERGIES: Allergies  Allergen Reactions  . Citalopram     Sleepy, dull feeling  . Augmentin [Amoxicillin-Pot Clavulanate] Diarrhea    severe  . Doxycycline   . Moxifloxacin     Redness, non-healing  . Latex Rash    Bandaids/tape  . Septra [Sulfamethoxazole-Trimethoprim] Rash  . Tape Rash  . Zithromax [Azithromycin] Rash    HOME MEDICATIONS: Outpatient Medications Prior to Visit  Medication Sig Dispense Refill  . cholecalciferol (VITAMIN D) 1000 units tablet Take 1,000 Units by mouth daily.     . clobetasol cream (TEMOVATE) 4.33 % Apply 1 application topically once a week.     . diphenhydrAMINE (BENADRYL) 25 MG tablet Take 25 mg by mouth at bedtime as needed.    Marland Kitchen  docusate sodium (COLACE) 100 MG capsule Take 100 mg by mouth daily as needed for mild constipation.    . famotidine (PEPCID) 20 MG tablet Take 20 mg by mouth 2 (two) times daily.  3  . LORazepam (ATIVAN) 1 MG tablet TAKE 1 TABLET BY MOUTH EVERYDAY AT BEDTIME  1  . naproxen sodium (ALEVE) 220 MG tablet Take 220 mg by mouth 2 (two) times daily as needed.    Vladimir Faster Glycol-Propyl Glycol (SYSTANE OP) Apply to eye daily as needed.    . polyethylene glycol (MIRALAX / GLYCOLAX) 17 g packet Take 17 g by mouth as needed.    . Probiotic Product (PROBIOTIC PO) Take by mouth.    . psyllium (METAMUCIL) 58.6 % packet Take 1 packet by mouth daily as needed.    .  topiramate (TOPAMAX) 100 MG tablet TAKE 1 TABLET BY MOUTH EVERY MORNING AND TAKE 2 TABLETS EACH EVENING 270 tablet 3  . traMADol (ULTRAM) 50 MG tablet Take 1 every 4-6 hours as needed for pain not controlled by Tylenol 6 tablet 0  . valACYclovir (VALTREX) 500 MG tablet Take 500 mg by mouth daily.    . bacitracin ophthalmic ointment Use on sutures 4 times a day for 12-14 days 3.5 g 3  . triamcinolone ointment (KENALOG) 0.5 % Apply 1 application topically every other day.     No facility-administered medications prior to visit.    PAST MEDICAL HISTORY: Past Medical History:  Diagnosis Date  . Bilateral ovarian cysts   . Colitis, Clostridium difficile   . 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 Herpes   . IBS (irritable bowel syndrome)   . Lichen sclerosus   . Melanoma ([place])   . Melanoma ([place])   . Osteopenia   . PE (pulmonary embolism) 1975  . Seizures ([place])   . Stroke [facility]) 1996   Hemorrhagic. speech and comprehension issues.  Mostly resolved.  . Vitamin D deficiency     PAST SURGICAL HISTORY: Past Surgical History:  Procedure Laterality Date  . APPENDECTOMY  1955  . AUGMENTATION MAMMAPLASTY Bilateral 2353   silicone  . BREAST ENHANCEMENT SURGERY  1975  . BROW LIFT Bilateral 06/07/2019   Procedure: BLEPHAROPLASTY UPPER EYELID WITH EXCESS SKIN;  Surgeon: Karle Starch, MD;  Location: [place];  Service: Ophthalmology;  Laterality: Bilateral;  . CATARACT EXTRACTION  09/2004   left eye  . CATARACT EXTRACTION  11/2007   right eye  . [place] OF UTERUS  1972  . MELANOMA EXCISION  07/1997   left shoulder  . TONSILLECTOMY  1954  . TUBAL LIGATION  1975  . [place]   right leg    FAMILY HISTORY: Family History  Problem Relation Age of Onset  . Heart attack Father   . Diabetes Father   . Atrial fibrillation Father   . Heart attack Mother   . Cancer Maternal Grandmother        esophageal  . Lymphoma Brother     SOCIAL  HISTORY: Social History   Socioeconomic History  . Marital status: Married    Spouse name: Not on file  . Number of children: 3  . Years of education: 58  . Highest education level: Not on file  Occupational History  . Occupation: retired  Tobacco Use  . Smoking status: Former Smoker    Quit date: 09/26/1989    Years since quitting: 30.1  . Smokeless tobacco: Never Used  Vaping Use  .  Vaping Use: Never used  Substance and Sexual Activity  . Alcohol use: Yes    Alcohol/week: 14.0 standard drinks    Types: 14 Glasses of wine per week    Comment: 2 glasses wine pm  . Drug use: No  . Sexual activity: Not on file  Other Topics Concern  . Not on file  Social History Narrative   Lives at home w/ her husband   Patient drinks 1-2 cups of caffeine daily.   Patient is right handed.   Social Determinants of Health   Financial Resource Strain:   . Difficulty of Paying Living Expenses:   Food Insecurity:   . Worried About Charity fundraiser in the Last Year:   . Arboriculturist in the Last Year:   Transportation Needs:   . Film/video editor (Medical):   Marland Kitchen Lack of Transportation (Non-Medical):   Physical Activity:   . Days of Exercise per Week:   . Minutes of Exercise per Session:   Stress:   . Feeling of Stress :   Social Connections:   . Frequency of Communication with Friends and Family:   . Frequency of Social Gatherings with Friends and Family:   . Attends Religious Services:   . Active Member of Clubs or Organizations:   . Attends Archivist Meetings:   Marland Kitchen Marital Status:   Intimate Partner Violence:   . Fear of Current or Ex-Partner:   . Emotionally Abused:   Marland Kitchen Physically Abused:   . Sexually Abused:       PHYSICAL EXAM  Vitals:   11/27/19 1102  BP: (!) 147/73  Pulse: 68  Weight: 166 lb (75.3 kg)  Height: 5\' 3"  (1.6 m)   Body mass index is 29.41 kg/m.  Generalized: Well developed, in no acute distress   Neurological examination   Mentation: Alert oriented to time, place, history taking. Follows all commands speech and language fluent Cranial nerve II-XII: Pupils were equal round reactive to light. Extraocular movements were full, visual field were full on confrontational test. Head turning and shoulder shrug  were normal and symmetric. Motor: The motor testing reveals 5 over 5 strength of all 4 extremities. Good symmetric motor tone is noted throughout.  Sensory: Sensory testing is intact to soft touch on all 4 extremities. No evidence of extinction is noted.  Coordination: Cerebellar testing reveals good finger-nose-finger and heel-to-shin bilaterally.  Gait and station: Gait is normal.  Reflexes: Deep tendon reflexes are symmetric and normal bilaterally.   DIAGNOSTIC DATA (LABS, IMAGING, TESTING) - I reviewed patient records, labs, notes, testing and imaging myself where available.  Lab Results  Component Value Date   WBC 4.6 09/01/2006   HGB 14.8 09/01/2006   HCT 43.1 09/01/2006   MCV 93.5 09/01/2006   PLT 222 09/01/2006      Component Value Date/Time   NA 138 09/01/2006 1405   K 3.7 09/01/2006 1405   CL 105 09/01/2006 1405   CO2 24 09/01/2006 1405   GLUCOSE 107 (H) 09/01/2006 1405   BUN 17 09/01/2006 1405   CREATININE 0.7 09/01/2006 1405   CALCIUM 9.6 09/01/2006 1405   PROT 6.9 09/01/2006 1405   ALBUMIN 4.2 09/01/2006 1405   AST 21 09/01/2006 1405   ALT 19 09/01/2006 1405   ALKPHOS 105 09/01/2006 1405   BILITOT 0.8 09/01/2006 1405   GFRNONAA 90 09/01/2006 1405   GFRAA 109 09/01/2006 1405   Lab Results  Component Value Date   CHOL 235 (HH)  09/01/2006   HDL 75.5 09/01/2006   LDLDIRECT 135.4 09/01/2006   TRIG 67 09/01/2006   CHOLHDL 3.1 CALC 09/01/2006   Lab Results  Component Value Date   HGBA1C 5.4 09/01/2006   Lab Results  Component Value Date   VITAMINB12 208 (L) 09/05/2006   No results found for: TSH    ASSESSMENT AND PLAN 79 y.o. year old female  has a past medical history  of Bilateral ovarian cysts, Colitis, Clostridium difficile, Focal seizure ([place]) (11/07/2014), Herpes, IBS (irritable bowel syndrome), Lichen sclerosus, Melanoma ([place]), Melanoma ([place]), Osteopenia, PE (pulmonary embolism) (1975), Seizures ([place]), Stroke ([place]) (1996), and Vitamin D deficiency. here with :  Seizures   Continue Topamax  Advised if seizures increase in frequency she should let us know  Visual disturbance   Has a follow-up with ophthalmology next month  Advised if symptoms become more frequent she should let us know  For now we will monitor-physical exam unremarkable.  No recent episode in the last 4 months  Follow-up in 6 months or sooner if needed  I spent 30 minutes of face-to-face and non-face-to-face time with patient.  This included previsit chart review, lab review, study review, order entry, electronic health record documentation, patient education.  Ward Givens, MSN, NP-C 11/27/2019, 11:07 AM [facility] [address] [place], [place] [address] [phone]

## 2019-11-28 ENCOUNTER — Ambulatory Visit
Admission: RE | Admit: 2019-11-28 | Discharge: 2019-11-28 | Disposition: A | Discharge status: Home / Self Care | Payer: PPO | Source: Ambulatory Visit | Attending: Internal Medicine | Admitting: Internal Medicine

## 2019-11-28 ENCOUNTER — Other Ambulatory Visit: Payer: Self-pay | Admitting: Internal Medicine

## 2019-11-28 DIAGNOSIS — R05 Cough: Secondary | ICD-10-CM

## 2019-11-28 DIAGNOSIS — R059 Cough, unspecified: Secondary | ICD-10-CM

## 2019-11-28 NOTE — Progress Notes (Signed)
I have read the note, and I agree with the clinical assessment and plan.  Andrzej Scully K Timber Marshman   

## 2019-12-16 DIAGNOSIS — R05 Cough: Secondary | ICD-10-CM | POA: Diagnosis not present

## 2019-12-16 DIAGNOSIS — J029 Acute pharyngitis, unspecified: Secondary | ICD-10-CM | POA: Diagnosis not present

## 2019-12-27 DIAGNOSIS — N3281 Overactive bladder: Secondary | ICD-10-CM | POA: Diagnosis not present

## 2019-12-27 DIAGNOSIS — L900 Lichen sclerosus et atrophicus: Secondary | ICD-10-CM | POA: Diagnosis not present

## 2019-12-27 DIAGNOSIS — N952 Postmenopausal atrophic vaginitis: Secondary | ICD-10-CM | POA: Diagnosis not present

## 2019-12-30 ENCOUNTER — Other Ambulatory Visit: Payer: Self-pay | Admitting: Obstetrics and Gynecology

## 2019-12-30 DIAGNOSIS — Z1231 Encounter for screening mammogram for malignant neoplasm of breast: Secondary | ICD-10-CM

## 2020-01-22 DIAGNOSIS — Z23 Encounter for immunization: Secondary | ICD-10-CM | POA: Diagnosis not present

## 2020-01-29 ENCOUNTER — Other Ambulatory Visit: Payer: Self-pay

## 2020-01-29 ENCOUNTER — Ambulatory Visit
Admission: RE | Admit: 2020-01-29 | Discharge: 2020-01-29 | Disposition: A | Discharge status: Home / Self Care | Payer: PPO | Source: Ambulatory Visit | Attending: Obstetrics and Gynecology | Admitting: Obstetrics and Gynecology

## 2020-01-29 DIAGNOSIS — Z1231 Encounter for screening mammogram for malignant neoplasm of breast: Secondary | ICD-10-CM

## 2020-02-03 DIAGNOSIS — H04123 Dry eye syndrome of bilateral lacrimal glands: Secondary | ICD-10-CM | POA: Diagnosis not present

## 2020-02-11 ENCOUNTER — Ambulatory Visit: Payer: PPO | Attending: Internal Medicine

## 2020-02-11 DIAGNOSIS — Z23 Encounter for immunization: Secondary | ICD-10-CM

## 2020-02-11 NOTE — Progress Notes (Signed)
   Covid-19 Vaccination Clinic  Name:  [name]    MRN: [id number] DOB: 09/22/1940  02/11/2020  [name] was observed post Covid-19 immunization for 15 minutes without incident. She was provided with Vaccine Information Sheet and instruction to access the V-Safe system.   [name] was instructed to call 911 with any severe reactions post vaccine: Marland Kitchen Difficulty breathing  . Swelling of face and throat  . A fast heartbeat  . A bad rash all over body  . Dizziness and weakness

## 2020-02-14 ENCOUNTER — Other Ambulatory Visit: Payer: Self-pay | Admitting: Adult Health

## 2020-04-30 DIAGNOSIS — R059 Cough, unspecified: Secondary | ICD-10-CM | POA: Diagnosis not present

## 2020-04-30 DIAGNOSIS — J029 Acute pharyngitis, unspecified: Secondary | ICD-10-CM | POA: Diagnosis not present

## 2020-04-30 DIAGNOSIS — R509 Fever, unspecified: Secondary | ICD-10-CM | POA: Diagnosis not present

## 2020-05-15 DIAGNOSIS — K219 Gastro-esophageal reflux disease without esophagitis: Secondary | ICD-10-CM | POA: Diagnosis not present

## 2020-05-15 DIAGNOSIS — U071 COVID-19: Secondary | ICD-10-CM | POA: Diagnosis not present

## 2020-05-15 DIAGNOSIS — Z1389 Encounter for screening for other disorder: Secondary | ICD-10-CM | POA: Diagnosis not present

## 2020-05-15 DIAGNOSIS — Z0000 Encounter for general adult medical examination without abnormal findings: Secondary | ICD-10-CM | POA: Diagnosis not present

## 2020-05-15 DIAGNOSIS — E7800 Pure hypercholesterolemia, unspecified: Secondary | ICD-10-CM | POA: Diagnosis not present

## 2020-05-15 DIAGNOSIS — E039 Hypothyroidism, unspecified: Secondary | ICD-10-CM | POA: Diagnosis not present

## 2020-05-15 DIAGNOSIS — G40209 Localization-related (focal) (partial) symptomatic epilepsy and epileptic syndromes with complex partial seizures, not intractable, without status epilepticus: Secondary | ICD-10-CM | POA: Diagnosis not present

## 2020-05-15 DIAGNOSIS — K589 Irritable bowel syndrome without diarrhea: Secondary | ICD-10-CM | POA: Diagnosis not present

## 2020-05-15 DIAGNOSIS — G4700 Insomnia, unspecified: Secondary | ICD-10-CM | POA: Diagnosis not present

## 2020-05-15 DIAGNOSIS — Z85820 Personal history of malignant melanoma of skin: Secondary | ICD-10-CM | POA: Diagnosis not present

## 2020-05-15 DIAGNOSIS — M8580 Other specified disorders of bone density and structure, unspecified site: Secondary | ICD-10-CM | POA: Diagnosis not present

## 2020-05-21 DIAGNOSIS — N952 Postmenopausal atrophic vaginitis: Secondary | ICD-10-CM | POA: Diagnosis not present

## 2020-05-21 DIAGNOSIS — R350 Frequency of micturition: Secondary | ICD-10-CM | POA: Diagnosis not present

## 2020-05-21 DIAGNOSIS — L900 Lichen sclerosus et atrophicus: Secondary | ICD-10-CM | POA: Diagnosis not present

## 2020-06-09 ENCOUNTER — Other Ambulatory Visit: Payer: Self-pay

## 2020-06-09 ENCOUNTER — Ambulatory Visit: Payer: PPO | Admitting: Adult Health

## 2020-06-09 ENCOUNTER — Encounter: Payer: Self-pay | Admitting: Adult Health

## 2020-06-09 VITALS — BP 130/66 | HR 66 | Ht 63.0 in | Wt 168.0 lb

## 2020-06-09 DIAGNOSIS — R569 Unspecified convulsions: Secondary | ICD-10-CM | POA: Diagnosis not present

## 2020-06-09 NOTE — Progress Notes (Signed)
I have read the note, and I agree with the clinical assessment and plan.  [name]   

## 2020-06-09 NOTE — Progress Notes (Signed)
PATIENT: [name] DOB: May 12, 1940  REASON FOR VISIT: follow up HISTORY FROM: patient  HISTORY OF PRESENT ILLNESS: Today 06/09/20:  [name] is a 80 year old female with a history of left hemorrhagic stroke and subsequent seizures.  She returns today for follow-up.  She continues on Topamax 100 mg in the morning and 200 mg in the evening.  At the last visit she was reporting black spots that  would appear in both eyes that lasted for several minutes.  She felt these events were different than her original events as she did not have trouble speaking.  She reports that the events that occurring with her eyes with the spots are very similar to what she had before.  She did see ophthalmology and her exam was relatively unremarkable with the exception of dry eyes and visual acuity.  She reports that she did not have any events from June to this past January.  She states in January she had just recovered from [facility] and she had the event that lasted for approximately 15 minutes.  The only symptoms was black spots in both eyes no additional symptoms.  All of her events seem to occur after a stressful event.  The patient reports that prior to coming to our office she had tried 5 or 6 other seizure medications.  She is unsure of the name of these medications.  11/27/19: [name] is a 80 year old female with a history of left hemorrhagic stroke and subsequent seizures.  She returns today for follow-up.  She remains on Topamax.  She states that she has had 3 events since January.  She reports that these events were slightly different.  She states that they presented with black spots in front of her vision in both visual fields.  She states that this lasted approximately 10 minutes.  Denies these events being followed by headache.  During this time she did not have any trouble speaking.  No weakness in the arms or legs.  She states that this happened again in March and then again in April.  She is not  had any subsequent events.  She states that she has been under a lot of stress over the last year.  Reports that her dad passed away and then her son unexpectedly passed.  She has a follow-up with ophthalmology next month and plans to discuss the symptoms as well.  She denies any additional strokelike symptoms.  She returns today for an evaluation.  HISTORY 11/26/18:  [name] is a 80 year old female with a history of left hemorrhagic stroke in 1996 and subsequent seizures.  She returns today for follow-up.  She remains on Topamax taking 100 mg in the morning and 200 mg in the evening.  She reports that she has had 2 events over the last year.  The first event was December 24, 2017.  She states that it started off with a bad headache and then she could not remember names.  It lasted for approximately 1 hour.  She does note that stressful events typically caused her episodes.  She states 1 week prior she was bitten by her dog and had to go to her PCP and was put on antibiotics.  She states the second event was December 18 she states that she [facility] her [facility].  She states that she developed aphasia could not talk or understand what people are saying for approximately 30 minutes.  She states that 1 week prior her  best friend had passed away.  Fortunately she is not had any episodes this year.  She states that she typically lets her husband drive.  If she does drive her husband is typically in the car with her.  She is able to complete all ADLs independently.  She returns today for follow-up.  REVIEW OF SYSTEMS: Out of a complete 14 system review of symptoms, the patient complains only of the following symptoms, and all other reviewed systems are negative.  See HPI  ALLERGIES: Allergies  Allergen Reactions  . Citalopram     Sleepy, dull feeling  . Augmentin [Amoxicillin-Pot Clavulanate] Diarrhea    severe  . Doxycycline   . Moxifloxacin     Redness, non-healing  .  Latex Rash    Bandaids/tape  . Septra [Sulfamethoxazole-Trimethoprim] Rash  . Tape Rash  . Zithromax [Azithromycin] Rash    HOME MEDICATIONS: Outpatient Medications Prior to Visit  Medication Sig Dispense Refill  . cholecalciferol (VITAMIN D) 1000 units tablet Take 1,000 Units by mouth daily.     . clobetasol cream (TEMOVATE) 9.02 % Apply 1 application topically once a week.     . docusate sodium (COLACE) 100 MG capsule Take 100 mg by mouth daily as needed for mild constipation.    . famotidine (PEPCID) 20 MG tablet Take 20 mg by mouth 2 (two) times daily.  3  . LORazepam (ATIVAN) 1 MG tablet TAKE 1 TABLET BY MOUTH EVERYDAY AT BEDTIME  1  . naproxen sodium (ALEVE) 220 MG tablet Take 220 mg by mouth 2 (two) times daily as needed.    Vladimir Faster Glycol-Propyl Glycol (SYSTANE OP) Apply to eye daily as needed.    . polyethylene glycol (MIRALAX / GLYCOLAX) 17 g packet Take 17 g by mouth as needed.    . Probiotic Product (PROBIOTIC PO) Take by mouth.    . psyllium (METAMUCIL) 58.6 % packet Take 1 packet by mouth daily as needed.    . topiramate (TOPAMAX) 100 MG tablet TAKE 1 TABLET BY MOUTH IN THE MORNING AND 2 IN THE EVENING 270 tablet 3  . traMADol (ULTRAM) 50 MG tablet Take 1 every 4-6 hours as needed for pain not controlled by Tylenol 6 tablet 0  . valACYclovir (VALTREX) 500 MG tablet Take 500 mg by mouth daily.    . diphenhydrAMINE (BENADRYL) 25 MG tablet Take 25 mg by mouth at bedtime as needed.     No facility-administered medications prior to visit.    PAST MEDICAL HISTORY: Past Medical History:  Diagnosis Date  . Bilateral ovarian cysts   . Colitis, Clostridium difficile   . 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 Herpes   . IBS (irritable bowel syndrome)   . Lichen sclerosus   . Melanoma ([place])   . Melanoma ([place])   . Osteopenia   . PE (pulmonary embolism) 1975  . Seizures ([place])   . Stroke [facility]) 1996   Hemorrhagic. speech and comprehension issues.  Mostly resolved.   . Vitamin D deficiency     PAST SURGICAL HISTORY: Past Surgical History:  Procedure Laterality Date  . APPENDECTOMY  1955  . AUGMENTATION MAMMAPLASTY Bilateral 4097   silicone  . BREAST ENHANCEMENT SURGERY  1975  . BROW LIFT Bilateral 06/07/2019   Procedure: BLEPHAROPLASTY UPPER EYELID WITH EXCESS SKIN;  Surgeon: Karle Starch, MD;  Location: [place];  Service: Ophthalmology;  Laterality: Bilateral;  . CATARACT EXTRACTION  09/2004   left eye  . CATARACT EXTRACTION  11/2007  right eye  . [place] OF UTERUS  1972  . MELANOMA EXCISION  07/1997   left shoulder  . TONSILLECTOMY  1954  . TUBAL LIGATION  1975  . [place]   right leg    FAMILY HISTORY: Family History  Problem Relation Age of Onset  . Heart attack Father   . Diabetes Father   . Atrial fibrillation Father   . Heart attack Mother   . Cancer Maternal Grandmother        esophageal  . Lymphoma Brother     SOCIAL HISTORY: Social History   Socioeconomic History  . Marital status: Married    Spouse name: Not on file  . Number of children: 3  . Years of education: 72  . Highest education level: Not on file  Occupational History  . Occupation: retired  Tobacco Use  . Smoking status: Former Smoker    Quit date: 09/26/1989    Years since quitting: 30.7  . Smokeless tobacco: Never Used  Vaping Use  . Vaping Use: Never used  Substance and Sexual Activity  . Alcohol use: Yes    Alcohol/week: 14.0 standard drinks    Types: 14 Glasses of wine per week    Comment: 2 glasses wine pm  . Drug use: No  . Sexual activity: Not on file  Other Topics Concern  . Not on file  Social History Narrative   Lives at home w/ her husband   Patient drinks 1-2 cups of caffeine daily.   Patient is right handed.   Social Determinants of Health   Financial Resource Strain: Not on file  Food Insecurity: Not on file  Transportation Needs: Not on file  Physical Activity: Not on file   Stress: Not on file  Social Connections: Not on file  Intimate Partner Violence: Not on file      PHYSICAL EXAM  Vitals:   06/09/20 1015  Weight: 168 lb (76.2 kg)  Height: 5\' 3"  (1.6 m)   Body mass index is 29.76 kg/m.  Generalized: Well developed, in no acute distress   Neurological examination  Mentation: Alert oriented to time, place, history taking. Follows all commands speech and language fluent Cranial nerve II-XII: Pupils were equal round reactive to light. Extraocular movements were full, visual field were full on confrontational test. Head turning and shoulder shrug  were normal and symmetric. Motor: The motor testing reveals 5 over 5 strength of all 4 extremities. Good symmetric motor tone is noted throughout.  Sensory: Sensory testing is intact to soft touch on all 4 extremities. No evidence of extinction is noted.  Coordination: Cerebellar testing reveals good finger-nose-finger and heel-to-shin bilaterally.  Gait and station: Gait is normal.  Reflexes: Deep tendon reflexes are symmetric and normal bilaterally.   DIAGNOSTIC DATA (LABS, IMAGING, TESTING) - I reviewed patient records, labs, notes, testing and imaging myself where available.  Lab Results  Component Value Date   WBC 4.6 09/01/2006   HGB 14.8 09/01/2006   HCT 43.1 09/01/2006   MCV 93.5 09/01/2006   PLT 222 09/01/2006      Component Value Date/Time   NA 138 09/01/2006 1405   K 3.7 09/01/2006 1405   CL 105 09/01/2006 1405   CO2 24 09/01/2006 1405   GLUCOSE 107 (H) 09/01/2006 1405   BUN 17 09/01/2006 1405   CREATININE 0.7 09/01/2006 1405   CALCIUM 9.6 09/01/2006 1405   PROT 6.9 09/01/2006 1405   ALBUMIN 4.2 09/01/2006 1405  AST 21 09/01/2006 1405   ALT 19 09/01/2006 1405   ALKPHOS 105 09/01/2006 1405   BILITOT 0.8 09/01/2006 1405   GFRNONAA 90 09/01/2006 1405   GFRAA 109 09/01/2006 1405   Lab Results  Component Value Date   CHOL 235 (HH) 09/01/2006   HDL 75.5 09/01/2006   LDLDIRECT  135.4 09/01/2006   TRIG 67 09/01/2006   CHOLHDL 3.1 CALC 09/01/2006   Lab Results  Component Value Date   HGBA1C 5.4 09/01/2006   Lab Results  Component Value Date   VITAMINB12 208 (L) 09/05/2006   No results found for: TSH    ASSESSMENT AND PLAN 80 y.o. year old female  has a past medical history of Bilateral ovarian cysts, Colitis, Clostridium difficile, Focal seizure ([place]) (11/07/2014), Herpes, IBS (irritable bowel syndrome), Lichen sclerosus, Melanoma ([place]), Melanoma ([place]), Osteopenia, PE (pulmonary embolism) (1975), Seizures ([place]), Stroke ([place]) (1996), and Vitamin D deficiency. here with :  Seizures   Continue Topamax 100 mg in the AM, 200 mg in the PM  It seems that the visual disturbance may be breakthrough seizures as these events are similar to what she originally had with the exception of no aphasia.  The patient will send me a MyChart message today to let me know what other seizure medication she has been on in the past.  We may consider adding on a different medication.  Advised if seizures increase in frequency she should let us know    Follow-up in 6 months or sooner if needed  I spent 30 minutes of face-to-face and non-face-to-face time with patient.  This included previsit chart review, lab review, study review, order entry, electronic health record documentation, patient education.  Ward Givens, MSN, NP-C 06/09/2020, 10:15 AM [facility] [address], [place],  [address] [phone]

## 2020-06-09 NOTE — Patient Instructions (Signed)
Your Plan:  Continue Topamax 100 mg in the AM, 200 mg at bedtime If your symptoms worsen or you develop new symptoms please let us know.    Thank you for coming to see [place] at [facility]. I hope we have been able to provide you high quality care today.  You may receive a patient satisfaction survey over the next few weeks. We would appreciate your feedback and comments so that we may continue to improve ourselves and the health of our patients.

## 2020-08-06 DIAGNOSIS — D2261 Melanocytic nevi of right upper limb, including shoulder: Secondary | ICD-10-CM | POA: Diagnosis not present

## 2020-08-06 DIAGNOSIS — Z85820 Personal history of malignant melanoma of skin: Secondary | ICD-10-CM | POA: Diagnosis not present

## 2020-08-06 DIAGNOSIS — D485 Neoplasm of uncertain behavior of skin: Secondary | ICD-10-CM | POA: Diagnosis not present

## 2020-08-06 DIAGNOSIS — D2271 Melanocytic nevi of right lower limb, including hip: Secondary | ICD-10-CM | POA: Diagnosis not present

## 2020-08-06 DIAGNOSIS — D225 Melanocytic nevi of trunk: Secondary | ICD-10-CM | POA: Diagnosis not present

## 2020-08-06 DIAGNOSIS — D2262 Melanocytic nevi of left upper limb, including shoulder: Secondary | ICD-10-CM | POA: Diagnosis not present

## 2020-08-06 DIAGNOSIS — L820 Inflamed seborrheic keratosis: Secondary | ICD-10-CM | POA: Diagnosis not present

## 2020-08-06 DIAGNOSIS — N763 Subacute and chronic vulvitis: Secondary | ICD-10-CM | POA: Diagnosis not present

## 2020-09-01 ENCOUNTER — Ambulatory Visit
Admission: RE | Admit: 2020-09-01 | Discharge: 2020-09-01 | Disposition: A | Discharge status: Home / Self Care | Payer: PPO | Source: Ambulatory Visit | Attending: Internal Medicine | Admitting: Internal Medicine

## 2020-09-01 ENCOUNTER — Other Ambulatory Visit: Payer: Self-pay | Admitting: Internal Medicine

## 2020-09-01 DIAGNOSIS — R059 Cough, unspecified: Secondary | ICD-10-CM | POA: Diagnosis not present

## 2020-09-01 DIAGNOSIS — J069 Acute upper respiratory infection, unspecified: Secondary | ICD-10-CM | POA: Diagnosis not present

## 2020-09-22 ENCOUNTER — Other Ambulatory Visit: Payer: Self-pay | Admitting: Internal Medicine

## 2020-09-22 ENCOUNTER — Ambulatory Visit
Admission: RE | Admit: 2020-09-22 | Discharge: 2020-09-22 | Disposition: A | Discharge status: Home / Self Care | Payer: PPO | Source: Ambulatory Visit | Attending: Internal Medicine | Admitting: Internal Medicine

## 2020-09-22 DIAGNOSIS — R059 Cough, unspecified: Secondary | ICD-10-CM

## 2020-09-24 DIAGNOSIS — R059 Cough, unspecified: Secondary | ICD-10-CM | POA: Diagnosis not present

## 2020-09-24 DIAGNOSIS — K219 Gastro-esophageal reflux disease without esophagitis: Secondary | ICD-10-CM | POA: Diagnosis not present

## 2020-10-20 DIAGNOSIS — K521 Toxic gastroenteritis and colitis: Secondary | ICD-10-CM | POA: Diagnosis not present

## 2020-10-20 DIAGNOSIS — R053 Chronic cough: Secondary | ICD-10-CM | POA: Diagnosis not present

## 2020-11-18 DIAGNOSIS — N895 Stricture and atresia of vagina: Secondary | ICD-10-CM | POA: Diagnosis not present

## 2020-11-18 DIAGNOSIS — N952 Postmenopausal atrophic vaginitis: Secondary | ICD-10-CM | POA: Diagnosis not present

## 2020-11-18 DIAGNOSIS — L900 Lichen sclerosus et atrophicus: Secondary | ICD-10-CM | POA: Diagnosis not present

## 2020-11-18 DIAGNOSIS — N898 Other specified noninflammatory disorders of vagina: Secondary | ICD-10-CM | POA: Diagnosis not present

## 2020-12-14 NOTE — Progress Notes (Signed)
PATIENT: [name] DOB: Dec 01, 1940  REASON FOR VISIT: follow up HISTORY FROM: patient  HISTORY OF PRESENT ILLNESS: Today 12/14/20:  [name] is a 80 year old female with a history of left hemorrhagic stroke and subsequent seizures.  She returns today for follow-up.  At the last visit the patient had had several events with the only symptom began black spots in the visual field bilaterally.  These events always occurred after a stressful event.  We discussed adding on Keppra however the patient asked to wait until this visit as the medication was expensive.  She has continued on Topamax 100 mg in the morning and 200 mg in the evening.  Since our last visit she has had 3 other events.  The only symptom that she has is black spots in visual fields in both eyes.  She denies any trouble speaking with these events.  She states that she is completely aware of her surroundings and can have a conversation with her husband when it is happening.  She states after about 10 minutes it resolves.  The patient is hesitant to try any new medication.  Reviewing her notes from [facility] from 2015 and from Dr. Jannifer Franklin she was not reporting black spots but rather blurry vision.  Looking at our system and EEG has not been completed through our office-her last MRI of the brain was in 2013.  06/09/20: [name] is a 80 year old female with a history of left hemorrhagic stroke and subsequent seizures.  She returns today for follow-up.  She continues on Topamax 100 mg in the morning and 200 mg in the evening.  At the last visit she was reporting black spots that  would appear in both eyes that lasted for several minutes.  She felt these events were different than her original events as she did not have trouble speaking.  She reports that the events that occurring with her eyes with the spots are very similar to what she had before.  She did see ophthalmology and her exam was relatively unremarkable with the exception of  dry eyes and visual acuity.  She reports that she did not have any events from June to this past January.  She states in January she had just recovered from [facility] and she had the event that lasted for approximately 15 minutes.  The only symptoms was black spots in both eyes no additional symptoms.  All of her events seem to occur after a stressful event.  The patient reports that prior to coming to our office she had tried 5 or 6 other seizure medications.  She is unsure of the name of these medications.  11/27/19: [name] is a 80 year old female with a history of left hemorrhagic stroke and subsequent seizures.  She returns today for follow-up.  She remains on Topamax.  She states that she has had 3 events since January.  She reports that these events were slightly different.  She states that they presented with black spots in front of her vision in both visual fields.  She states that this lasted approximately 10 minutes.  Denies these events being followed by headache.  During this time she did not have any trouble speaking.  No weakness in the arms or legs.  She states that this happened again in March and then again in April.  She is not had any subsequent events.  She states that she has been under a lot of stress over the last year.  Reports that her dad  passed away and then her son unexpectedly passed.  She has a follow-up with ophthalmology next month and plans to discuss the symptoms as well.  She denies any additional strokelike symptoms.  She returns today for an evaluation.  HISTORY 11/26/18:   [name] is a 80 year old female with a history of left hemorrhagic stroke in 1996 and subsequent seizures.  She returns today for follow-up.  She remains on Topamax taking 100 mg in the morning and 200 mg in the evening.  She reports that she has had 2 events over the last year.  The first event was December 24, 2017.  She states that it started off with a bad headache and then she could not remember  names.  It lasted for approximately 1 hour.  She does note that stressful events typically caused her episodes.  She states 1 week prior she was bitten by her dog and had to go to her PCP and was put on antibiotics.  She states the second event was December 18 she states that she [place] her [place] [facility].  She states that she developed aphasia could not talk or understand what people are saying for approximately 30 minutes.  She states that 1 week prior her best friend had passed away.  Fortunately she is not had any episodes this year.  She states that she typically lets her husband drive.  If she does drive her husband is typically in the car with her.  She is able to complete all ADLs independently.  She returns today for follow-up.  REVIEW OF SYSTEMS: Out of a complete 14 system review of symptoms, the patient complains only of the following symptoms, and all other reviewed systems are negative.  See HPI  ALLERGIES: Allergies  Allergen Reactions   Citalopram     Sleepy, dull feeling   Augmentin [Amoxicillin-Pot Clavulanate] Diarrhea    severe   Doxycycline    Moxifloxacin     Redness, non-healing   Latex Rash    Bandaids/tape   Septra [Sulfamethoxazole-Trimethoprim] Rash   Tape Rash   Zithromax [Azithromycin] Rash    HOME MEDICATIONS: Outpatient Medications Prior to Visit  Medication Sig Dispense Refill   cholecalciferol (VITAMIN D) 1000 units tablet Take 1,000 Units by mouth daily.      clobetasol cream (TEMOVATE) [id number] % Apply 1 application topically once a week.      docusate sodium (COLACE) 100 MG capsule Take 100 mg by mouth daily as needed for mild constipation.     famotidine (PEPCID) 20 MG tablet Take 20 mg by mouth 2 (two) times daily.  3   fexofenadine (ALLEGRA) 180 MG tablet 1 tablet     LORazepam (ATIVAN) 1 MG tablet TAKE 1 TABLET BY MOUTH EVERYDAY AT BEDTIME  1   naproxen sodium (ALEVE) 220 MG tablet Take 220 mg by mouth 2 (two) times daily as  needed.     Probiotic Product (PROBIOTIC PO) Take by mouth.     psyllium (METAMUCIL) 58.6 % packet Take 1 packet by mouth daily as needed.     topiramate (TOPAMAX) 100 MG tablet TAKE 1 TABLET BY MOUTH IN THE MORNING AND 2 IN THE EVENING 270 tablet 3   traMADol (ULTRAM) 50 MG tablet Take 1 every 4-6 hours as needed for pain not controlled by Tylenol 6 tablet 0   valACYclovir (VALTREX) 500 MG tablet Take 500 mg by mouth daily.     No facility-administered medications prior to visit.  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 speech and comprehension issues.  Mostly resolved.   Vitamin D deficiency     PAST SURGICAL HISTORY: Past Surgical History:  Procedure Laterality Date   APPENDECTOMY  1955   AUGMENTATION MAMMAPLASTY Bilateral [id number]   silicone   BREAST ENHANCEMENT SURGERY  1975   BROW LIFT Bilateral 06/07/2019   Procedure: BLEPHAROPLASTY UPPER EYELID WITH EXCESS SKIN;  Surgeon: Karle Starch, MD;  Location: [place];  Service: Ophthalmology;  Laterality: Bilateral;   CATARACT EXTRACTION  09/2004   left eye   CATARACT EXTRACTION  11/2007   right eye   DILATION AND CURETTAGE OF UTERUS  1972   MELANOMA EXCISION  07/1997   left shoulder   [place]   right leg    FAMILY HISTORY: Family History  Problem Relation Age of Onset   Heart attack Father    Diabetes Father    Atrial fibrillation Father    Heart attack Mother    Cancer Maternal Grandmother        esophageal   Lymphoma Brother     SOCIAL HISTORY: Social History   Socioeconomic History   Marital status: Married     Spouse name: Not on file   Number of children: 3   Years of education: 13   Highest education level: Not on file  Occupational History   Occupation: retired  Tobacco Use   Smoking status: Former    Types: Cigarettes    Quit date: 09/26/1989    Years since quitting: 31.2   Smokeless tobacco: Never  Vaping Use   Vaping Use: Never used  Substance and Sexual Activity   Alcohol use: Yes    Alcohol/week: 14.0 standard drinks    Types: 14 Glasses of wine per week    Comment: 2 glasses wine pm   Drug use: No   Sexual activity: Not on file  Other Topics Concern   Not on file  Social History Narrative   Lives at home w/ her husband   Patient drinks 1-2 cups of caffeine daily.   Patient is right handed.   Social Determinants of Health   Financial Resource Strain: Not on file  Food Insecurity: Not on file  Transportation Needs: Not on file  Physical Activity: Not on file  Stress: Not on file  Social Connections: Not on file  Intimate Partner Violence: Not on file      PHYSICAL EXAM  There were no vitals filed for this visit.  There is no height or weight on file to calculate BMI.  Generalized: Well developed, in no acute distress   Neurological examination  Mentation: Alert oriented to time, place, history taking. Follows all commands speech and language fluent Cranial nerve II-XII: Pupils were equal round reactive to light. Extraocular movements were full, visual field were full on confrontational test. Head turning and shoulder shrug  were normal and symmetric. Motor: The motor testing reveals 5 over 5 strength of all 4 extremities. Good symmetric  motor tone is noted throughout.  Sensory: Sensory testing is intact to soft touch on all 4 extremities. No evidence of extinction is noted.  Coordination: Cerebellar testing reveals good finger-nose-finger and heel-to-shin bilaterally.  Gait and station: Gait is normal.  Reflexes: Deep tendon reflexes are symmetric and normal  bilaterally.   DIAGNOSTIC DATA (LABS, IMAGING, TESTING) - I reviewed patient records, labs, notes, testing and imaging myself where available.  Lab Results  Component Value Date   WBC 4.6 09/01/2006   HGB 14.8 09/01/2006   HCT 43.1 09/01/2006   MCV 93.5 09/01/2006   PLT 222 09/01/2006      Component Value Date/Time   NA 138 09/01/2006 1405   K 3.7 09/01/2006 1405   CL 105 09/01/2006 1405   CO2 24 09/01/2006 1405   GLUCOSE 107 (H) 09/01/2006 1405   BUN 17 09/01/2006 1405   CREATININE 0.7 09/01/2006 1405   CALCIUM 9.6 09/01/2006 1405   PROT 6.9 09/01/2006 1405   ALBUMIN 4.2 09/01/2006 1405   AST 21 09/01/2006 1405   ALT 19 09/01/2006 1405   ALKPHOS 105 09/01/2006 1405   BILITOT 0.8 09/01/2006 1405   GFRNONAA 90 09/01/2006 1405   GFRAA 109 09/01/2006 1405   Lab Results  Component Value Date   CHOL 235 (HH) 09/01/2006   HDL 75.5 09/01/2006   LDLDIRECT 135.4 09/01/2006   TRIG 67 09/01/2006   CHOLHDL 3.1 CALC 09/01/2006   Lab Results  Component Value Date   HGBA1C 5.4 09/01/2006   Lab Results  Component Value Date   VITAMINB12 208 (L) 09/05/2006   No results found for: TSH    ASSESSMENT AND PLAN 80 y.o. year old female  has a past medical history of Bilateral ovarian cysts, Colitis, Clostridium difficile, Focal seizure ([place]) (11/07/2014), Herpes, IBS (irritable bowel syndrome), Lichen sclerosus, Melanoma ([place]), Melanoma ([place]), Osteopenia, PE (pulmonary embolism) (1975), Seizures ([place]), Stroke ([place]) (1996), and Vitamin D deficiency. 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 Jannifer Franklin.  Her presentation seems less likely a seizure possible ocular migraine?  The episodes are so infrequent and resolve fairly quickly that treatment may not be necessary. Follow-up in 6 months or sooner if needed    Ward Givens, MSN, NP-C 12/14/2020, 7:04 PM [facility] [address], [place], [place] [address] [phone]

## 2020-12-15 ENCOUNTER — Ambulatory Visit: Payer: PPO | Admitting: Adult Health

## 2020-12-15 ENCOUNTER — Encounter: Payer: Self-pay | Admitting: Adult Health

## 2020-12-15 VITALS — BP 144/75 | HR 68 | Ht 63.0 in | Wt 165.4 lb

## 2020-12-15 DIAGNOSIS — R569 Unspecified convulsions: Secondary | ICD-10-CM

## 2020-12-15 DIAGNOSIS — H539 Unspecified visual disturbance: Secondary | ICD-10-CM | POA: Diagnosis not present

## 2020-12-15 NOTE — Progress Notes (Signed)
I have read the note, and I agree with the clinical assessment and plan.  Mivaan Corbitt K Leng Montesdeoca   

## 2020-12-15 NOTE — Progress Notes (Signed)
I have read the note, and I agree with the clinical assessment and plan.  Mischelle Reeg K Saharsh Sterling   

## 2020-12-15 NOTE — Patient Instructions (Signed)
Your Plan:  Continue Topamax EEG ordered If your symptoms worsen or you develop new symptoms please let us know.   Thank you for coming to see [place] at [facility]. I hope we have been able to provide you high quality care today.  You may receive a patient satisfaction survey over the next few weeks. We would appreciate your feedback and comments so that we may continue to improve ourselves and the health of our patients.

## 2020-12-22 ENCOUNTER — Other Ambulatory Visit: Payer: Self-pay

## 2020-12-22 ENCOUNTER — Ambulatory Visit: Payer: PPO

## 2020-12-22 DIAGNOSIS — R569 Unspecified convulsions: Secondary | ICD-10-CM | POA: Diagnosis not present

## 2020-12-28 ENCOUNTER — Other Ambulatory Visit: Payer: Self-pay | Admitting: Obstetrics and Gynecology

## 2020-12-28 DIAGNOSIS — Z1231 Encounter for screening mammogram for malignant neoplasm of breast: Secondary | ICD-10-CM

## 2020-12-28 NOTE — Procedures (Signed)
    History:  [name] is a 80 year old patient with a history of a left hemorrhagic stroke who subsequently developed seizures.  The patient has had events of black spots in the visual field bilaterally that may occur after a stressful event.  The patient remains on Topamax for seizures.  She is being evaluated for these visual events.  This is a routine EEG.  No skull defects are noted.  Medications include vitamin D, Colace, Pepcid, Allegra, Ativan, Aleve, Topamax, Ultram, and Valtrex.  EEG classification: Normal awake  Description of the recording: The background rhythms of this recording consists of a fairly well modulated medium amplitude alpha rhythm of 8 Hz that is reactive to eye opening and closure. As the record progresses, the patient appears to remain in the waking state throughout the recording. Photic stimulation was performed, resulting in a bilateral and symmetric photic driving response. Hyperventilation was not performed. At no time during the recording does there appear to be evidence of spike or spike wave discharges or evidence of focal slowing. EKG monitor shows no evidence of cardiac rhythm abnormalities with a heart rate of 66.  Impression: This is a normal EEG recording in the waking state. No evidence of ictal or interictal discharges are seen.

## 2020-12-29 ENCOUNTER — Telehealth: Payer: Self-pay | Admitting: *Deleted

## 2020-12-29 NOTE — Telephone Encounter (Signed)
Spoke with patient and advised EEG normal. Pt's questions were answered. She was advised to continue Topamax and f/u at next scheduled ov on 06/16/21 at 1130. Pt verbalized appreciation.

## 2020-12-29 NOTE — Telephone Encounter (Signed)
-----   Message from Ward Givens, NP sent at 12/28/2020  3:44 PM EDT ----- EEG is normal. Please call patient

## 2021-01-18 DIAGNOSIS — L900 Lichen sclerosus et atrophicus: Secondary | ICD-10-CM | POA: Diagnosis not present

## 2021-01-18 DIAGNOSIS — N950 Postmenopausal bleeding: Secondary | ICD-10-CM | POA: Diagnosis not present

## 2021-01-18 DIAGNOSIS — N952 Postmenopausal atrophic vaginitis: Secondary | ICD-10-CM | POA: Diagnosis not present

## 2021-02-02 DIAGNOSIS — H04123 Dry eye syndrome of bilateral lacrimal glands: Secondary | ICD-10-CM | POA: Diagnosis not present

## 2021-02-05 ENCOUNTER — Ambulatory Visit: Payer: PPO

## 2021-02-05 DIAGNOSIS — R9389 Abnormal findings on diagnostic imaging of other specified body structures: Secondary | ICD-10-CM | POA: Diagnosis not present

## 2021-02-05 DIAGNOSIS — L900 Lichen sclerosus et atrophicus: Secondary | ICD-10-CM | POA: Diagnosis not present

## 2021-02-05 DIAGNOSIS — N952 Postmenopausal atrophic vaginitis: Secondary | ICD-10-CM | POA: Diagnosis not present

## 2021-02-05 DIAGNOSIS — N950 Postmenopausal bleeding: Secondary | ICD-10-CM | POA: Diagnosis not present

## 2021-02-14 ENCOUNTER — Other Ambulatory Visit: Payer: Self-pay | Admitting: Adult Health

## 2021-02-17 ENCOUNTER — Ambulatory Visit
Admission: RE | Admit: 2021-02-17 | Discharge: 2021-02-17 | Disposition: A | Discharge status: Home / Self Care | Payer: PPO | Source: Ambulatory Visit | Attending: Obstetrics and Gynecology | Admitting: Obstetrics and Gynecology

## 2021-02-17 DIAGNOSIS — Z1231 Encounter for screening mammogram for malignant neoplasm of breast: Secondary | ICD-10-CM

## 2021-02-24 ENCOUNTER — Encounter (HOSPITAL_BASED_OUTPATIENT_CLINIC_OR_DEPARTMENT_OTHER): Payer: Self-pay | Admitting: Obstetrics and Gynecology

## 2021-02-24 ENCOUNTER — Other Ambulatory Visit: Payer: Self-pay

## 2021-02-24 NOTE — Progress Notes (Signed)
Spoke w/ via phone for pre-op interview--- pt Lab needs dos----  no (per anes)/  pre-op orders pending             Lab results------ no COVID test -----patient states asymptomatic no test needed Arrive at ------- 0930 on 03-02-2021 NPO after MN NO Solid Food.  Clear liquids from MN until--- 0830 Med rec completed Medications to take morning of surgery ----- topamax, pepcid Diabetic medication ----- n/a Patient instructed no nail polish to be worn day of surgery Patient instructed to bring photo id and insurance card day of surgery Patient aware to have Driver (ride ) / caregiver  for 24 hours after surgery -- husband, Journalist, newspaper Patient Special Instructions ----- n/a Pre-Op special Istructions ----- sent inbox message to dr Landry Mellow in epic requested orders Patient verbalized understanding of instructions that were given at this phone interview. Patient denies shortness of breath, chest pain, fever, cough at this phone interview.    Anesthesia Review:  Focal seizure post residual left hemorrhagic cva 1996 aphasia resolved (pt stated last one approx 6 months ago, symptom vision with black spots that resolved in 10 mins no loc)  PCP: Dr Electa Sniff Neurologist:  Dr C. Jannifer Franklin (lov 12-15-2020 epic) Chest x-ray : 09-22-2020 epic EKG : no Echo : no Stress test: nuclear 02-04-2006 epic Activity level: denies sob w/ any activity Sleep Study/ CPAP : no Blood Thinner/ Instructions /Last Dose: no ASA / Instructions/ Last Dose :  no

## 2021-02-25 ENCOUNTER — Other Ambulatory Visit: Payer: Self-pay | Admitting: Obstetrics and Gynecology

## 2021-03-01 ENCOUNTER — Other Ambulatory Visit: Payer: Self-pay | Admitting: Obstetrics and Gynecology

## 2021-03-01 NOTE — H&P (Signed)
Reason for Appointment  1. postmenopausal bleeding.   History of Present Illness  General:  80 y/o presents to discuss ultrasound results preformed to evaluate postmenopausal bleeding.  Bleeding started on 01/01/2021 - just that one instance. she noticed it when she wiped. she reports a spot the size of a lemon. she has not had any bleeding since. she denies sex prior to the bleeding. she has not had sex in over 3 years. Pelvic ultrasound today 02/05/2021 shows a uterus that measures 7.5 cm x 3.2 cm x 3.4 cm ... the endometrium is 1.28 cm. There is a questionable hyperechoic mass 1.4 cm x 0.8 cm. the right ovary is not visualized. the elft ovary has simple cyst the largest is 1.9 cm and both are stable in size from previous ultrasoun.   Current Medications  Taking   Premarin(Estrogens Conjugated) 0.625 MG/GM Cream as directed Vaginal every night for 2 weeks, then twice a week  Clobetasol Propionate 0.05 % Ointment 1 application Externally 2x a day for one week, then 1x a day for 1 week, then as needed  Famotidine 40 MG Tablet 1 tablet Orally Twice a day  Flonase Allergy Relief(Fluticasone Propionate) 50 MCG/ACT Suspension 2 sprays Nasally Once a day  Allegra Allergy(Fexofenadine HCl) 180 MG Tablet 1 tablet Orally 1 hour before bedtime  LORazepam 1 MG Tablet 1 tablet at bedtime as needed Orally once a day  valACYclovir HCl 500 MG Tablet TAKE 1 TABLET BY MOUTH EVERY DAY   Topiramate 100 MG Tablet 1 tablet each morning and 2 tablets each evening  Probiotic - Capsule as directed Orally   Vitamin D 2000 UNIT Tablet 1 tablet with a meal Orally Once a day  Aleve(Naproxen) 220 MG Capsule 1 tablet with food or milk as needed Orally twice a day as needed, Notes: prn  MiraLax(Polyethylene Glycol 3350) - Powder Orally Once a week and as needed, Notes: prn  Colace(Docusate Sodium) 100 mg Capsule 1 tablet as needed Orally twice a day, Notes: PRN  Medication List reviewed and reconciled with the patient    Past Medical History  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   Osteopenia.   vitamin D deficiency.   left shoulder melanoma, April 1999, annual exam [facility].   Lichen [id number].   C diff bacteria in colon.   Ovarian cysts-2009.   IBS constipation/diarrhea.   Herpes.   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   Possible neuropathy.   Varicose Veins.   Covid 19 (04/30/20).   Pneumonia.   [place] Ahr, Ophtho vin [facility]ascular [facility] (released), neuro willis, derm isenstein ([place] derm), GI outlaw, Opthalmology [facility]) .    Surgical History  cataract-left eye   Endoscopy   tonsillectomy age [age]  appendicitis " "  Right leg (varicose veins) 1969  D&C 1972/1973  BTL/Breast Implants 1975  Multiple Pulmonary embolism after BTL/Breast Implant surgery   Melanoma-excision left shoulder 07/1997  right eye cataract   Colonoscopy x 2   hemorrhoidectomy, Gross 4/13  B blephoroplasty fowler 2/21   Family History  Father: deceased 47 yrs, triple bypass CAD about 26, thyroid problems, [facility] nursing home, diagnosed with Diabetes, Hypertension, Coronary artery disease  Mother: deceased 65 yrs, heart attack, diagnosed with Coronary artery disease  Brother 1: deceased, thyroid  problems, hypercholesterol, lymphoma-died 10-14-16  Son(s): deceased, congenital heart disease, ? hypertrophic cardiomyopathy  1 brother(s) . 3 son(s) - healthy.   Pt unsure about colon cancer, polyps, or liver disease/no known gyn cancers 1 deceased son.   Social History  General:  Tobacco use cigarettes: Former smoker, Quit in year 1991, Pack-year Hx: 25, Tobacco history last updated  02/05/2021, Additional Findings: Tobacco Non-User Ex-moderate cigarette smoker (10-19/day), Vaping No. no EXPOSURE TO PASSIVE SMOKE. Alcohol: yes, 2 wine a night. Caffeine: yes, coffee, 1-2 servings daily. no Recreational drug use. DIET: yes, low carb. Exercise: yes, walk less, very active. Marital Status: married, Married. Children: 3, Boys 1 son deceased. 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 Seat belt use: yes.    Gyn History  Sexual activity currently sexually active, but husband has erectile dysfunction.  Periods : postmenopausal.  Denies H/O LMP N/A.  Denies H/O Birth control BTL.  Last pap smear date 10/23/2012-negative.  Last mammogram date 01/29/2020 - normal.  H/O Abnormal pap smear bx in 70's.  H/O STD HSV 2-dx 2006.  Menarche 42.  menopause 52.    OB History  Number of pregnancies 3.  Pregnancy # 1 live birth, boy, vaginal delivery.  Pregnancy # 2 live birth, vaginal delivery, boy.  Pregnancy # 3 live birth, vaginal delivery, boy.  NVD 3.    Allergies  Moxifloxacin HCl: redness (eyes) - Allergy  Augmentin: severe diarrhea  Citalopram Hydrobromide: sleepy, dull feeling - Side Effects  Septra: rash  Zithromax Z-Pak: rash  Doxycycline Monohydrate: diarrhea - Side Effects  Betamethasone  Latex: redness  Bacitracin: ointment ( eyes)  Pantoprazole: diarrhea  NexIUM: diarrhea  Esomeprazole Magnesium: diarrhea   Hospitalization/Major Diagnostic Procedure  surgeries   childbirth x 3 SVD   hemorraghic stroke   hemorrhoidectomy, Gross 07/2011   Review of Systems  CONSTITUTIONAL:  Chills No. Fatigue No. Fever No. Night sweats No. Recent travel outside [place] No. Sweats No. Weight change No.  OPHTHALMOLOGY:  Blurring of vision no. Change in vision no. Double vision no.  ENT:  Dizziness no. Nose bleeds no. Sore throat no. Teeth pain no.  ALLERGY:  Hives no.  CARDIOLOGY:  Chest pain no. High blood pressure no. Irregular heart beat no. Leg edema no.  Palpitations no.  RESPIRATORY:  Shortness of breath no. Cough no. Wheezing no.  UROLOGY:  Pain with urination no. Urinary urgency no. Urinary frequency no. Urinary incontinence no. Difficulty urinating No. Blood in urine No.  GASTROENTEROLOGY:  Abdominal pain no. Appetite change no. Bloating/belching no. Blood in stool or on toilet paper no. Change in bowel movements no. Constipation no. Diarrhea no. Difficulty swallowing no. Nausea no.  FEMALE REPRODUCTIVE:  Vulvar pain no. Vulvar rash no. Abnormal vaginal bleeding yes. Breast pain no. Nipple discharge no. Pain with intercourse no. Pelvic pain no. Unusual vaginal discharge no. Vaginal itching no.  MUSCULOSKELETAL:  Muscle aches no.  NEUROLOGY:  Headache no. Tingling/numbness no. Weakness no.  PSYCHOLOGY:  Depression no. Anxiety no. Nervousness no. Sleep disturbances no. Suicidal ideation no .  ENDOCRINOLOGY:  Excessive thirst no. Excessive urination no. Hair loss no. Heat or cold intolerance no.  HEMATOLOGY/LYMPH:  Abnormal bleeding no. Easy bruising no. Swollen glands no.  DERMATOLOGY:  New/changing skin lesion no. Rash no. Sores no.   Vital Signs  Wt 167.8, Wt change .6 lb, Ht 64, BMI 28.80, Pulse sitting 60, BP sitting 150/79.   Examination  General Examination: CONSTITUTIONAL: alert, oriented, NAD , alert, oriented, NAD . SKIN: moist,  warm , moist, warm. EYES: Conjunctiva clear , Conjunctiva clear. LUNGS: good I:E efffort noted, clear to auscultation bilaterally , good I:E efffort noted. HEART: regular rate and rhythm. ABDOMEN: no guarding, soft and not tender , soft, non-tender/non-distended, bowel sounds present . FEMALE GENITOURINARY: normal external genitalia, labia - agglutination of the labia minora and bilateral labia majora, erythema of labia majora bilaterally, vagina - vaginal mucosa is atrophic, no blood is seen in the vaginal vault, cervix - no discharge or lesions or CMT, adnexa - no masses or tenderness, uterus -  nontender and normal size on palpation . EXTREMITIES: normal range of motion. NEUROLOGIC EXAM: alert and oriented x 3. PSYCH: affect normal , affect normal, good eye contact.     Assessments     1. PMB (postmenopausal bleeding) - N95.0 (Primary)   2. Endometrial thickening on ultrasound - R93.89   3. Vaginal atrophy - N95.2   4. Lichen sclerosus - V03.5, stable   Treatment  1. PMB (postmenopausal bleeding)  IMAGING: US ECHO TRANSVAGINAL Notes: Given thickened endometrium and possible mass in endometrium recommend hysteroscopy D&C and possiible. polypectomy with myosure to r/o hyperplasis or malignancy. pt does not tolerate pelvic exams well int he office. R/B/A of surgery discussed witht he patient including but not limited to r/o infection , bleeding, possible perforation of the uterus with the need for further surgery. pt adivsed to avoid eating or drinking after midnight the night prior to surgery. will call pt with surgery info.  Referral To: Reason:please check insurance coverage of hysteroscopy D&C and polypectomy with myosure     2. Endometrial thickening on ultrasound  Notes: Given thickened endometrium and possible mass in endometrium recommend hysteroscopy D&C and possiible. polypectomy with myosure to r/o hyperplasis or malignancy. pt does not tolerate pelvic exams well int he office. R/B/A of surgery discussed witht he patient including but not limited to r/o infection , bleeding, possible perforation of the uterus with the need for further surgery. pt adivsed to avoid eating or drinking after midnight the night prior to surgery. will call pt with surgery info.  Referral To: Reason:please check insurance coverage of hysteroscopy D&C and polypectomy with myosure     3. Vaginal atrophy  Refill Premarin Cream, 0.625 MG/GM, as directed, Vaginal, 2 times a week, 90 days, 30 Gram, Refills 1   4. Lichen sclerosus  Refill Clobetasol Propionate Ointment, 0.09 %, 1 application,  Externally, 2x a day for one week, then 1x a day for 1 week, then as needed, 90 days, 30 Gram, Refills 1

## 2021-03-01 NOTE — H&P (Deleted)
  The note originally documented on this encounter has been moved the the encounter in which it belongs.  

## 2021-03-02 ENCOUNTER — Ambulatory Visit (HOSPITAL_BASED_OUTPATIENT_CLINIC_OR_DEPARTMENT_OTHER)
Admission: RE | Admit: 2021-03-02 | Discharge: 2021-03-02 | Disposition: A | Discharge status: Home / Self Care | Payer: PPO | Source: Ambulatory Visit | Attending: Obstetrics and Gynecology | Admitting: Obstetrics and Gynecology

## 2021-03-02 ENCOUNTER — Other Ambulatory Visit: Payer: Self-pay

## 2021-03-02 ENCOUNTER — Encounter (HOSPITAL_BASED_OUTPATIENT_CLINIC_OR_DEPARTMENT_OTHER): Payer: Self-pay | Admitting: Obstetrics and Gynecology

## 2021-03-02 ENCOUNTER — Ambulatory Visit (HOSPITAL_BASED_OUTPATIENT_CLINIC_OR_DEPARTMENT_OTHER): Payer: PPO | Admitting: Anesthesiology

## 2021-03-02 ENCOUNTER — Encounter (HOSPITAL_BASED_OUTPATIENT_CLINIC_OR_DEPARTMENT_OTHER)
Admission: RE | Disposition: A | Discharge status: Home / Self Care | Payer: Self-pay | Source: Ambulatory Visit | Attending: Obstetrics and Gynecology

## 2021-03-02 DIAGNOSIS — C541 Malignant neoplasm of endometrium: Secondary | ICD-10-CM | POA: Insufficient documentation

## 2021-03-02 DIAGNOSIS — Z86711 Personal history of pulmonary embolism: Secondary | ICD-10-CM | POA: Diagnosis not present

## 2021-03-02 DIAGNOSIS — N840 Polyp of corpus uteri: Secondary | ICD-10-CM | POA: Diagnosis not present

## 2021-03-02 DIAGNOSIS — N950 Postmenopausal bleeding: Secondary | ICD-10-CM | POA: Diagnosis not present

## 2021-03-02 DIAGNOSIS — R9389 Abnormal findings on diagnostic imaging of other specified body structures: Secondary | ICD-10-CM | POA: Diagnosis not present

## 2021-03-02 DIAGNOSIS — Z87891 Personal history of nicotine dependence: Secondary | ICD-10-CM | POA: Diagnosis not present

## 2021-03-02 DIAGNOSIS — N952 Postmenopausal atrophic vaginitis: Secondary | ICD-10-CM | POA: Diagnosis not present

## 2021-03-02 DIAGNOSIS — E559 Vitamin D deficiency, unspecified: Secondary | ICD-10-CM | POA: Diagnosis not present

## 2021-03-02 DIAGNOSIS — K449 Diaphragmatic hernia without obstruction or gangrene: Secondary | ICD-10-CM | POA: Insufficient documentation

## 2021-03-02 DIAGNOSIS — Z8673 Personal history of transient ischemic attack (TIA), and cerebral infarction without residual deficits: Secondary | ICD-10-CM | POA: Diagnosis not present

## 2021-03-02 DIAGNOSIS — G40909 Epilepsy, unspecified, not intractable, without status epilepticus: Secondary | ICD-10-CM | POA: Diagnosis not present

## 2021-03-02 HISTORY — DX: Personal history of other infectious and parasitic diseases: Z86.19

## 2021-03-02 HISTORY — DX: Herpesviral infection of urogenital system, unspecified: A60.00

## 2021-03-02 HISTORY — DX: Mixed irritable bowel syndrome: K58.2

## 2021-03-02 HISTORY — DX: Personal history of malignant melanoma of skin: Z85.820

## 2021-03-02 HISTORY — DX: Presence of spectacles and contact lenses: Z97.3

## 2021-03-02 HISTORY — DX: Postmenopausal bleeding: N95.0

## 2021-03-02 HISTORY — DX: Abnormal findings on diagnostic imaging of other specified body structures: R93.89

## 2021-03-02 HISTORY — PX: DILATATION & CURETTAGE/HYSTEROSCOPY WITH MYOSURE: SHX6511

## 2021-03-02 HISTORY — DX: Personal history of other diseases of the female genital tract: Z87.42

## 2021-03-02 SURGERY — DILATATION & CURETTAGE/HYSTEROSCOPY WITH MYOSURE
Anesthesia: General

## 2021-03-02 MED ORDER — LIDOCAINE 2% (20 MG/ML) 5 ML SYRINGE
INTRAMUSCULAR | Status: AC
  Filled 2021-03-02: qty 5

## 2021-03-02 MED ORDER — PROPOFOL 10 MG/ML IV BOLUS
INTRAVENOUS | Status: AC
  Filled 2021-03-02: qty 20

## 2021-03-02 MED ORDER — OXYCODONE HCL 5 MG PO TABS
5.0000 mg | ORAL_TABLET | Freq: Once | ORAL | Status: AC | PRN
  Administered 2021-03-02: 5 mg via ORAL

## 2021-03-02 MED ORDER — FENTANYL CITRATE (PF) 100 MCG/2ML IJ SOLN
INTRAMUSCULAR | Status: AC
  Filled 2021-03-02: qty 2

## 2021-03-02 MED ORDER — ONDANSETRON HCL 4 MG/2ML IJ SOLN
INTRAMUSCULAR | Status: AC
  Filled 2021-03-02: qty 2

## 2021-03-02 MED ORDER — AMISULPRIDE (ANTIEMETIC) 5 MG/2ML IV SOLN: 10.0000 mg | Freq: Once | INTRAVENOUS | Status: DC | PRN

## 2021-03-02 MED ORDER — FENTANYL CITRATE (PF) 100 MCG/2ML IJ SOLN
INTRAMUSCULAR | Status: DC | PRN
  Administered 2021-03-02 (×4): 25 ug via INTRAVENOUS

## 2021-03-02 MED ORDER — DEXAMETHASONE SODIUM PHOSPHATE 10 MG/ML IJ SOLN
INTRAMUSCULAR | Status: AC
  Filled 2021-03-02: qty 1

## 2021-03-02 MED ORDER — KETOROLAC TROMETHAMINE 30 MG/ML IJ SOLN
INTRAMUSCULAR | Status: DC | PRN
  Administered 2021-03-02: 15 mg via INTRAVENOUS

## 2021-03-02 MED ORDER — OXYCODONE HCL 5 MG PO TABS
ORAL_TABLET | ORAL | Status: AC
  Filled 2021-03-02: qty 1

## 2021-03-02 MED ORDER — FENTANYL CITRATE (PF) 100 MCG/2ML IJ SOLN
25.0000 ug | INTRAMUSCULAR | Status: DC | PRN
  Administered 2021-03-02 (×2): 25 ug via INTRAVENOUS

## 2021-03-02 MED ORDER — IBUPROFEN 800 MG PO TABS: 800.0000 mg | ORAL_TABLET | Freq: Three times a day (TID) | ORAL | 0 refills | Status: DC | PRN

## 2021-03-02 MED ORDER — PROPOFOL 10 MG/ML IV BOLUS
INTRAVENOUS | Status: DC | PRN
  Administered 2021-03-02: 150 mg via INTRAVENOUS
  Administered 2021-03-02: 50 mg via INTRAVENOUS

## 2021-03-02 MED ORDER — ONDANSETRON HCL 4 MG/2ML IJ SOLN: 4.0000 mg | Freq: Once | INTRAMUSCULAR | Status: DC | PRN

## 2021-03-02 MED ORDER — ACETAMINOPHEN 500 MG PO TABS: 1000.0000 mg | ORAL_TABLET | ORAL | Status: DC

## 2021-03-02 MED ORDER — LIDOCAINE 2% (20 MG/ML) 5 ML SYRINGE
INTRAMUSCULAR | Status: DC | PRN
  Administered 2021-03-02: 60 mg via INTRAVENOUS

## 2021-03-02 MED ORDER — BUPIVACAINE HCL (PF) 0.25 % IJ SOLN
INTRAMUSCULAR | Status: DC | PRN
  Administered 2021-03-02: 20 mL

## 2021-03-02 MED ORDER — ONDANSETRON HCL 4 MG/2ML IJ SOLN
INTRAMUSCULAR | Status: DC | PRN
  Administered 2021-03-02: 4 mg via INTRAVENOUS

## 2021-03-02 MED ORDER — ACETAMINOPHEN 500 MG PO TABS: 1000.0000 mg | ORAL_TABLET | Freq: Three times a day (TID) | ORAL | 0 refills | Status: DC | PRN

## 2021-03-02 MED ORDER — DEXAMETHASONE SODIUM PHOSPHATE 4 MG/ML IJ SOLN
INTRAMUSCULAR | Status: DC | PRN
  Administered 2021-03-02: 5 mg via INTRAVENOUS

## 2021-03-02 MED ORDER — POVIDONE-IODINE 10 % EX SWAB: 2.0000 "application " | Freq: Once | CUTANEOUS | Status: DC

## 2021-03-02 MED ORDER — LACTATED RINGERS IV SOLN: INTRAVENOUS | Status: DC

## 2021-03-02 MED ORDER — SODIUM CHLORIDE 0.9 % IR SOLN
Status: DC | PRN
  Administered 2021-03-02: 3000 mL

## 2021-03-02 MED ORDER — ACETAMINOPHEN 500 MG PO TABS
ORAL_TABLET | ORAL | Status: AC
  Filled 2021-03-02: qty 2

## 2021-03-02 MED ORDER — ACETAMINOPHEN 500 MG PO TABS
1000.0000 mg | ORAL_TABLET | Freq: Once | ORAL | Status: AC
  Administered 2021-03-02: 1000 mg via ORAL

## 2021-03-02 MED ORDER — OXYCODONE HCL 5 MG/5ML PO SOLN: 5.0000 mg | Freq: Once | ORAL | Status: AC | PRN

## 2021-03-02 SURGICAL SUPPLY — 11 items
CATH ROBINSON RED A/P 16FR (CATHETERS) ×2 IMPLANT
DEVICE MYOSURE REACH (MISCELLANEOUS) ×2 IMPLANT
GAUZE 4X4 16PLY ~~LOC~~+RFID DBL (SPONGE) ×3 IMPLANT
GLOVE SURG ENC TEXT LTX SZ6.5 (GLOVE) ×3 IMPLANT
GLOVE SURG UNDER POLY LF SZ6.5 (GLOVE) ×6 IMPLANT
GOWN STRL REUS W/TWL LRG LVL3 (GOWN DISPOSABLE) ×6 IMPLANT
KIT PROCEDURE FLUENT (KITS) ×3 IMPLANT
KIT TURNOVER CYSTO (KITS) ×3 IMPLANT
PACK VAGINAL MINOR WOMEN LF (CUSTOM PROCEDURE TRAY) ×3 IMPLANT
PAD OB MATERNITY 4.3X12.25 (PERSONAL CARE ITEMS) ×3 IMPLANT
SEAL ROD LENS SCOPE MYOSURE (ABLATOR) ×3 IMPLANT

## 2021-03-02 NOTE — Anesthesia Postprocedure Evaluation (Signed)
Anesthesia Post Note  Patient: [name]  Procedure(s) Performed: [place]     Patient location during evaluation: PACU Anesthesia Type: General Level of consciousness: awake and alert Pain management: pain level controlled Vital Signs Assessment: post-procedure vital signs reviewed and stable Respiratory status: spontaneous breathing, nonlabored ventilation and respiratory function stable Cardiovascular status: blood pressure returned to baseline and stable Postop Assessment: no apparent nausea or vomiting Anesthetic complications: no   No notable events documented.  Last Vitals:  Vitals:   03/02/21 1230 03/02/21 1309  BP: (!) 126/55 (!) 150/74  Pulse: 66 (!) 55  Resp: 14 16  Temp:  (!) 36.4 C  SpO2: 94% 100%    Last Pain:  Vitals:   03/02/21 1309  TempSrc:   PainSc: 4                  Lidia Collum

## 2021-03-02 NOTE — Discharge Instructions (Signed)
  Post Anesthesia Home Care Instructions  **You may begin taking Tylenol at 4:00 pm today **You may begin taking Ibuprofen, Motrin, Advil, Aleve or other Nsaids after 6:00 pm today  Activity: Get plenty of rest for the remainder of the day. A responsible adult should stay with you for 24 hours following the procedure.  For the next 24 hours, DO NOT: -Drive a car -Paediatric nurse -Drink alcoholic beverages -Take any medication unless instructed by your physician -Make any legal decisions or sign important papers.  Meals: Start with liquid foods such as gelatin or soup. Progress to regular foods as tolerated. Avoid greasy, spicy, heavy foods. If nausea and/or vomiting occur, drink only clear liquids until the nausea and/or vomiting subsides. Call your physician if vomiting continues.  Special Instructions/Symptoms: Your throat may feel dry or sore from the anesthesia or the breathing tube placed in your throat during surgery. If this causes discomfort, gargle with warm salt water. The discomfort should disappear within 24 hours.  DISCHARGE INSTRUCTIONS: HYSTEROSCOPY / ENDOMETRIAL ABLATION The following instructions have been prepared to help you care for yourself upon your return home.   May take stool softener while taking narcotic pain medication to prevent constipation.  Drink plenty of water.  Personal hygiene:  Use sanitary pads for vaginal drainage, not tampons.  Shower the day after your procedure.  NO tub baths, pools or Jacuzzis for 2-3 weeks.  Wipe front to back after using the bathroom.  Activity and limitations:  Do NOT drive or operate any equipment for 24 hours. The effects of anesthesia are still present and drowsiness may result.  Do NOT rest in bed all day.  Walking is encouraged.  Walk up and down stairs slowly.  You may resume your normal activity in one to two days or as indicated by your physician. Sexual activity: NO intercourse for at least 2 weeks after  the procedure, or as indicated by your Doctor.  Diet: Eat a light meal as desired this evening. You may resume your usual diet tomorrow.  Return to Work: You may resume your work activities in one to two days or as indicated by Marine scientist.  What to expect after your surgery: Expect to have vaginal bleeding/discharge for 2-3 days and spotting for up to 10 days. It is not unusual to have soreness for up to 1-2 weeks. You may have a slight burning sensation when you urinate for the first day. Mild cramps may continue for a couple of days. You may have a regular period in 2-6 weeks.  Call your doctor for any of the following:  Excessive vaginal bleeding or clotting, saturating and changing one pad every hour.  Inability to urinate 6 hours after discharge from hospital.  Pain not relieved by pain medication.  Fever of 100.4 F or greater.  Unusual vaginal discharge or odor.

## 2021-03-02 NOTE — H&P (Signed)
Date of Initial H&P: 03/01/2021  History reviewed, patient examined, no change in status, stable for surgery.

## 2021-03-02 NOTE — Transfer of Care (Signed)
Immediate Anesthesia Transfer of Care Note  Patient: [name]  Procedure(s) Performed: Procedure(s) (LRB): DILATATION & CURETTAGE/HYSTEROSCOPY WITH MYOSURE (N/A)  Patient Location: PACU  Anesthesia Type: General  Level of Consciousness: awake, sedated, patient cooperative and responds to stimulation  Airway & Oxygen Therapy: Patient Spontanous Breathing and Patient connected to [place] 02 and soft FM   Post-op Assessment: Report given to PACU RN, Post -op Vital signs reviewed and stable and Patient moving all extremities  Post vital signs: Reviewed and stable  Complications: No apparent anesthesia complications

## 2021-03-02 NOTE — Op Note (Signed)
03/02/2021  12:14 PM  PATIENT:  [name]  80 y.o. female  PRE-OPERATIVE DIAGNOSIS:  Postmenopausal Bleeding Endometrial thickening  POST-OPERATIVE DIAGNOSIS:  Postmenopausal Bleeding Endometrial thickening  PROCEDURE:  Procedure(s): DILATATION & CURETTAGE/HYSTEROSCOPY WITH MYOSURE (N/A)  SURGEON:  Surgeon(s) and Role:    Christophe Louis, MD - Primary  PHYSICIAN ASSISTANT:   ASSISTANTS: none   ANESTHESIA:   general  EBL:  5 mL   BLOOD ADMINISTERED:none  DRAINS: none   LOCAL MEDICATIONS USED:  MARCAINE     SPECIMEN:  Source of Specimen:  endometrial polyp and curettings   DISPOSITION OF SPECIMEN:  PATHOLOGY  COUNTS:  YES  TOURNIQUET:  * No tourniquets in log *  DICTATION: .Note written in EPIC  PLAN OF CARE: Discharge to home after PACU  PATIENT DISPOSITION:  PACU - hemodynamically stable.   Delay start of Pharmacological VTE agent (>24hrs) due to surgical blood loss or risk of bleeding: not applicable   Findings: atrophic vaginal mucosa and cervical mucosa/ endometrial polyp noted. Atrophic appearing surrounding endometrium   Procedure: Patient was taken to the operating room #[facility] at [facility] where she was placed under general anesthesia. She was placed in the dorsal lithotomy position. She was prepped and draped in the usual sterile fashion. A speculum was placed into the vaginal vault. The anterior lip of the cervix was grasped with a single-tooth tenaculum. Quarter percent Marcaine was injected at the 4 and 8:00 positions of the cervix. The cervix was then sounded to 7 cm. The cervix was dilated to approximately 6 mm. Mysosure operative  hysteroscope was inserted. The findings noted above. Myosure reach  blade was introduced throught the hysteroscope. The endometrial polyp and curettings were obtained in less than  5 minute.  There was no evidence of perforation. Hysteroscope was then removed.  There was no sign of uterine  perforation. .The single-tooth tenaculum  was removed from the anterior lip of the cervix. Excellent hemostasis was noted. The speculum was removed from the patient's vagina. She was awakened from anesthesia taken care  To the recovery  room awake and in stable condition. Sponge lap and needle counts were correct x2.

## 2021-03-02 NOTE — Anesthesia Preprocedure Evaluation (Signed)
Anesthesia Evaluation  Patient identified by MRN, date of birth, ID band Patient awake    Reviewed: Allergy & Precautions, NPO status , Patient's Chart, lab work & pertinent test results  History of Anesthesia Complications Negative for: history of anesthetic complications  Airway Mallampati: II  TM Distance: >3 FB Neck ROM: Full    Dental  (+) Dental Advisory Given, Teeth Intact   Pulmonary former smoker, PE (remote; 1975)   Pulmonary exam normal        Cardiovascular negative cardio ROS Normal cardiovascular exam     Neuro/Psych Seizures -, Well Controlled,  CVA (1996; ocassional residual aphasia)    GI/Hepatic Neg liver ROS, hiatal hernia,   Endo/Other  negative endocrine ROS  Renal/GU negative Renal ROS  negative genitourinary   Musculoskeletal negative musculoskeletal ROS (+)   Abdominal   Peds  Hematology negative hematology ROS (+)   Anesthesia Other Findings   Reproductive/Obstetrics Postmenopausal Bleeding  Endometrial thickening                            Anesthesia Physical Anesthesia Plan  ASA: 2  Anesthesia Plan: General   Post-op Pain Management:    Induction: Intravenous  PONV Risk Score and Plan: 3 and Ondansetron, Dexamethasone, Midazolam and Treatment may vary due to age or medical condition  Airway Management Planned: LMA  Additional Equipment: None  Intra-op Plan:   Post-operative Plan: Extubation in OR  Informed Consent: I have reviewed the patients History and Physical, chart, labs and discussed the procedure including the risks, benefits and alternatives for the proposed anesthesia with the patient or authorized representative who has indicated his/her understanding and acceptance.     Dental advisory given  Plan Discussed with:   Anesthesia Plan Comments:         Anesthesia Quick Evaluation

## 2021-03-02 NOTE — Anesthesia Procedure Notes (Signed)

## 2021-03-03 ENCOUNTER — Encounter (HOSPITAL_BASED_OUTPATIENT_CLINIC_OR_DEPARTMENT_OTHER): Payer: Self-pay | Admitting: Obstetrics and Gynecology

## 2021-03-03 LAB — SURGICAL PATHOLOGY

## 2021-03-05 DIAGNOSIS — C541 Malignant neoplasm of endometrium: Secondary | ICD-10-CM | POA: Diagnosis not present

## 2021-03-08 ENCOUNTER — Telehealth: Payer: Self-pay | Admitting: *Deleted

## 2021-03-08 NOTE — Telephone Encounter (Signed)
Called and spoke with the patient to schedule a new patient appt with Dr Berline Lopes on 11/23 at 9:45 am. Patient given the arrival time of 9:15 am. Patient given the address and phone number for the clinic. Patient aware of the policy for mask and visitors.

## 2021-03-09 ENCOUNTER — Encounter: Payer: Self-pay | Admitting: Gynecologic Oncology

## 2021-03-09 NOTE — H&P (View-Only) (Signed)
GYNECOLOGIC ONCOLOGY NEW PATIENT CONSULTATION   Patient Name: [name]  Patient Age: 80 y.o. Date of Service: 03/10/2021 Referring Provider: Dr. 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  We reviewed the nature of endometrial cancer and its recommended surgical staging, including total hysterectomy, bilateral salpingo-oophorectomy, and lymph node assessment. The patient is a suitable candidate for staging via a minimally invasive approach to surgery.  We reviewed that robotic assistance would be used to complete the surgery.   We discussed that most endometrial cancer is detected early and that decisions regarding adjuvant therapy will be made based on her final pathology.   We reviewed the sentinel lymph node technique. Risks and benefits of sentinel lymph node biopsy was reviewed. We reviewed the technique and ICG dye. The patient DOES NOT have an iodine allergy or known liver dysfunction. We reviewed the false negative rate (0.4%), and that 3% of patients with metastatic disease will not have it detected by SLN biopsy in endometrial cancer. A low risk of allergic reaction to the dye, <0.2% for ICG, has been reported. We also discussed that in the case of failed mapping, which occurs 40% of the time, a bilateral or unilateral lymphadenectomy will be performed at the surgeon's discretion.   Potential benefits of sentinel nodes including a higher detection rate for metastasis due to ultrastaging and potential reduction in operative morbidity. However, there remains uncertainty as to the role for treatment of micrometastatic disease. Further, the benefit of operative morbidity associated with the SLN technique in endometrial cancer is not yet completely known. In other patient populations (e.g. the cervical cancer population) there has been  observed reductions in morbidity with SLN biopsy compared to pelvic lymphadenectomy. Lymphedema, nerve dysfunction and lymphocysts are all potential risks with the SLN technique as with complete lymphadenectomy. Additional risks to the patient include the risk of damage to an internal organ while operating in an altered view (e.g. the black and white image of the robotic fluorescence imaging mode).   The patient was consented for a robotic assisted hysterectomy, bilateral salpingo-oophorectomy, sentinel lymph node evaluation, possible lymph node dissection, possible laparotomy, vaginal biopsy. The risks of surgery were discussed in detail and she understands these to include infection; wound separation; hernia; vaginal cuff separation, injury to adjacent organs such as bowel, bladder, blood vessels, ureters and nerves; bleeding which may require blood transfusion; anesthesia risk; thromboembolic events; possible death; unforeseen complications; possible need for re-exploration; medical complications such as heart attack, stroke, pleural effusion and pneumonia; and, if full lymphadenectomy is performed the risk of lymphedema and lymphocyst. The patient will receive DVT and antibiotic prophylaxis as indicated. She voiced a clear understanding. She had the opportunity to ask questions. Perioperative instructions were reviewed with her. Prescriptions for post-op medications were sent to her pharmacy of choice.  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 I will have my office reach out to her neurologist for recommendations. Normally, in someone with her history of a provoked VTE after surgery, I would recommend 4 weeks of prophylactic anticoagulation in the setting of major abdominal surgery and a cancer diagnosis. Although her episodes sound concerning for TIA symptoms, she has been followed by neurology with these being called  seizure-like activity.   Plan to biopsy vaginal lesion given its appearance.   A copy of  this note was sent to the patient's referring provider.   90 minutes of total time was spent for this patient encounter, including preparation, face-to-face counseling with the patient and coordination of care, and documentation of the encounter.   Jeral Pinch, MD  Division of Gynecologic Oncology  Department of Obstetrics and Gynecology  [facility]  ___________________________________________  Chief Complaint: Chief Complaint  Patient presents with   Endometrial cancer [facility])    History of Present Illness:  [name] is a 80 y.o. y.o. female who is seen in consultation at the request of Dr. Landry Mellow for an evaluation of endometrial adenocarcinoma  Patient reports menopause at the age of 89.  On September 6 this year, she had a small amount of bleeding that she noted on her panty liner.  She called her OB/GYN that same day.  She had an initial appointment on October 3, underwent ultrasound on the 21st.  Ultrasound showed that her uterus measures 7.5 x 3.2 x 3.4 cm with an endometrial lining measuring 1.3 cm.  Left ovary normal in appearance with a simple cyst, right ovary not seen.  On 11/15, patient underwent hysteroscopy with endometrial sampling.  Final pathology from this showed endometrial adenocarcinoma, endometrioid type, FIGO grade 1.  Findings at the time of surgery were endometrial polyps, vaginal and vulvar atrophy.  Patient notes appetite has been somewhat decreased after surgery.  She denies any nausea or emesis.  She denies any recent weight changes.  She struggles with constipation at baseline and uses MiraLAX and a stool softener as needed.  She denies any urinary symptoms.  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  She was using Premarin until recently but has stopped it since receiving her  diagnosis of endometrial cancer.  Patient's medical history is notable for pulmonary embolism after concurrent breast surgery and tubal ligation in the mid 1970s.  This happened a week after surgery and the patient remembers being on blood thinner for about a month.  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  She presented with aphasia.  She has no lingering deficits but notes that she is on topiramate for prevention of what she describes as "episodes".  She sees a neurologist regularly.  The episodes that she describes are 20 minutes in length when she sees spots and cannot put words together.  She thinks that she has now about 2 of these episodes a year at most.  More recently, the patient thinks that she had a DVT within the last several years and was perhaps on aspirin for a period of time after.  I am able to confirm that this in epic or Care Everywhere.  Patient lives near [place] with her husband.  She has a history of tobacco use but quit in 1991.    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 , tend to happen with stress, no loc is aware of surrounding's,  controlled with topamax  (02-24-2021  pt stated last one approx 6 months ago)   Herpes genitalis    Hiatal hernia 2007   History of Clostridium difficile colitis    per pt yrs ago   History  of COVID-19 04/2019   per pt mild symptoms that resolved   History of hemorrhagic cerebrovascular accident (CVA) with residual deficit 1996   neurologist-- dr c. 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    Lichen sclerosus    labia   Osteopenia    Personal history of PE (pulmonary embolism) 1975   per pt post op breast augmentation/ BTL one week,  stated completed anticoagation, and none before or after   PMB (postmenopausal bleeding)    gyn-- dr Landry Mellow   Thickened endometrium    Vitamin D deficiency    Wears glasses      PAST SURGICAL HISTORY:  Past Surgical History:  Procedure Laterality Date   APPENDECTOMY  1955   AUGMENTATION MAMMAPLASTY Bilateral 3295   silicone   BROW LIFT Bilateral 06/07/2019   Procedure: BLEPHAROPLASTY UPPER EYELID WITH EXCESS SKIN;  Surgeon: Karle Starch, MD;  Location: [facility];  Service: Ophthalmology;  Laterality: Bilateral;   CATARACT EXTRACTION W/ INTRAOCULAR LENS IMPLANT Bilateral    left 06/ 2006;  right 08/ 2009   DILATATION & CURETTAGE/HYSTEROSCOPY WITH MYOSURE N/A 03/02/2021   Procedure: DILATATION & CURETTAGE/HYSTEROSCOPY WITH MYOSURE;  Surgeon: Christophe Louis, MD;  Location: [facility];  Service: Gynecology;  Laterality: N/A;   DILATION AND CURETTAGE OF UTERUS  1972   MELANOMA EXCISION  07/1997   left shoulder   [place]   right leg    OB/GYN HISTORY:  OB History  Gravida Para Term Preterm AB Living  1 1          SAB IAB Ectopic Multiple Live Births               # Outcome Date GA Lbr Len/2nd Weight Sex Delivery Anes PTL Lv  1 Para             No LMP recorded. Patient is postmenopausal.  Age at menarche: 70 Age at menopause: 3 Hx of HRT: Denies Hx of STDs: Denies Last pap: 2020 History of abnormal pap smears: Denies  SCREENING STUDIES:  Last mammogram: 2022  Last colonoscopy: 2019  MEDICATIONS: Outpatient Encounter Medications as of  03/10/2021  Medication Sig   acetaminophen (TYLENOL) 500 MG tablet Take 2 tablets (1,000 mg total) by mouth every 8 (eight) hours as needed for moderate pain or mild pain.   clobetasol cream (TEMOVATE) 1.88 % Apply 1 application topically 2 (two) times a week.   docusate sodium (COLACE) 100 MG capsule Take 100 mg by mouth daily as needed for mild constipation.   famotidine (PEPCID) 40 MG tablet Take 40 mg by mouth 2 (two) times daily.   fexofenadine (ALLEGRA) 180 MG tablet Take 180 mg by mouth every evening.   LORazepam (ATIVAN) 1 MG tablet Take 1 mg by mouth at bedtime.   psyllium (METAMUCIL) 58.6 % packet Take 1 packet by mouth daily as needed (constipation).   topiramate (TOPAMAX) 100 MG tablet TAKE 1 TABLET BY MOUTH IN THE MORNING AND 2 IN THE EVENING  traMADol (ULTRAM) 50 MG tablet Take 1 tablet (50 mg total) by mouth every 6 (six) hours as needed for severe pain. For AFTER surgery, do not take and drive   valACYclovir (VALTREX) 500 MG tablet Take 500 mg by mouth every evening.   [DISCONTINUED] Probiotic Product (PROBIOTIC PO) Take by mouth daily. (Patient not taking: Reported on 03/10/2021)   ibuprofen (ADVIL) 800 MG tablet Take 1 tablet (800 mg total) by mouth every 8 (eight) hours as needed for moderate pain or mild pain.   [DISCONTINUED] cholecalciferol (VITAMIN D) 1000 units tablet Take 1,000 Units by mouth daily. (Patient not taking: Reported on 03/10/2021)   [DISCONTINUED] PREMARIN vaginal cream Place 1 applicator vaginally 2 (two) times a week. (Patient not taking: Reported on 03/09/2021)   No facility-administered encounter medications on file as of 03/10/2021.    ALLERGIES:  Allergies  Allergen Reactions   Citalopram     Sleepy, dull feeling   Augmentin [Amoxicillin-Pot Clavulanate] Diarrhea    severe   Doxycycline Diarrhea   Moxifloxacin     Redness, non-healing (eye)   Septra [Sulfamethoxazole-Trimethoprim] Rash   Tape Rash   Zithromax [Azithromycin] Rash      FAMILY HISTORY:  Family History  Problem Relation Age of Onset   Heart attack Mother    Heart attack Father    Diabetes Father    Atrial fibrillation Father    Lymphoma Brother    Cancer Maternal Grandmother        esophageal   Seizures Neg Hx    Colon cancer Neg Hx    Breast cancer Neg Hx    Ovarian cancer Neg Hx    Endometrial cancer Neg Hx    Pancreatic cancer Neg Hx    Prostate cancer Neg Hx      SOCIAL HISTORY:  Social Connections: Not on file    REVIEW OF SYSTEMS:  Denies appetite changes, fevers, chills, fatigue, unexplained weight changes. Denies hearing loss, neck lumps or masses, mouth sores, ringing in ears or voice changes. Denies cough or wheezing.  Denies shortness of breath. Denies chest pain or palpitations. Denies leg swelling. Denies abdominal distention, pain, blood in stools, constipation, diarrhea, nausea, vomiting, or early satiety. Denies pain with intercourse, dysuria, frequency, hematuria or incontinence. Denies hot flashes, pelvic pain, vaginal bleeding or vaginal discharge.   Denies joint pain, back pain or muscle pain/cramps. Denies itching, rash, or wounds. Denies dizziness, headaches, numbness or seizures. Denies swollen lymph nodes or glands, denies easy bruising or bleeding. Denies anxiety, depression, confusion, or decreased concentration.  Physical Exam:  Vital Signs for this encounter:  Blood pressure (!) 167/72, pulse 72, temperature 97.7 F (36.5 C), temperature source Oral, resp. rate 16, height 5\' 3"  (1.6 m), weight 166 lb (75.3 kg), SpO2 100 %. Body mass index is 29.41 kg/m. General: Alert, oriented, no acute distress.  HEENT: Normocephalic, atraumatic. Sclera anicteric.  Chest: Clear to auscultation bilaterally. No wheezes, rhonchi, or rales. Cardiovascular: Regular rate and rhythm, no murmurs, rubs, or gallops.  Abdomen: Normoactive bowel sounds. Soft, nondistended, nontender to palpation. No masses or hepatosplenomegaly  appreciated. No palpable fluid wave.  Well-healed right lower quadrant incision. Extremities: Grossly normal range of motion. Warm, well perfused. No edema bilaterally.  Skin: No rashes.  Multiple small lesions on patient's torso, what appears to be an omphalolith.  Lymphatics: No cervical, supraclavicular, or inguinal adenopathy.  GU:  External female genitalia significantly atrophic with loss of architecture.  There is a 1 cm beefy red lesion on the  right labia, some desquamative areas on the left upper labia..             Bladder/urethra:  No lesions or masses, well supported bladder             Vagina: Moderately atrophic, no lesions or masses seen.             Cervix: Normal appearing, no lesions.             Uterus: Small, mobile, no parametrial involvement or nodularity.             Adnexa: No masses appreciated.  Rectal: Deferred.  LABORATORY AND RADIOLOGIC DATA:  Outside medical records were reviewed to synthesize the above history, along with the history and physical obtained during the visit.   Lab Results  Component Value Date   WBC 4.6 09/01/2006   HGB 14.8 09/01/2006   HCT 43.1 09/01/2006   PLT 222 09/01/2006   GLUCOSE 107 (H) 09/01/2006   CHOL 235 (HH) 09/01/2006   TRIG 67 09/01/2006   HDL 75.5 09/01/2006   LDLDIRECT 135.4 09/01/2006   ALT 19 09/01/2006   AST 21 09/01/2006   NA 138 09/01/2006   K 3.7 09/01/2006   CL 105 09/01/2006   CREATININE 0.7 09/01/2006   BUN 17 09/01/2006   CO2 24 09/01/2006   HGBA1C 5.4 09/01/2006

## 2021-03-09 NOTE — Progress Notes (Signed)
GYNECOLOGIC ONCOLOGY NEW PATIENT CONSULTATION   Patient Name: [name]  Patient Age: 80 y.o. Date of Service: 03/10/2021 Referring Provider: Dr. 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  We reviewed the nature of endometrial cancer and its recommended surgical staging, including total hysterectomy, bilateral salpingo-oophorectomy, and lymph node assessment. The patient is a suitable candidate for staging via a minimally invasive approach to surgery.  We reviewed that robotic assistance would be used to complete the surgery.   We discussed that most endometrial cancer is detected early and that decisions regarding adjuvant therapy will be made based on her final pathology.   We reviewed the sentinel lymph node technique. Risks and benefits of sentinel lymph node biopsy was reviewed. We reviewed the technique and ICG dye. The patient DOES NOT have an iodine allergy or known liver dysfunction. We reviewed the false negative rate (0.4%), and that 3% of patients with metastatic disease will not have it detected by SLN biopsy in endometrial cancer. A low risk of allergic reaction to the dye, <0.2% for ICG, has been reported. We also discussed that in the case of failed mapping, which occurs 40% of the time, a bilateral or unilateral lymphadenectomy will be performed at the surgeon's discretion.   Potential benefits of sentinel nodes including a higher detection rate for metastasis due to ultrastaging and potential reduction in operative morbidity. However, there remains uncertainty as to the role for treatment of micrometastatic disease. Further, the benefit of operative morbidity associated with the SLN technique in endometrial cancer is not yet completely known. In other patient populations (e.g. the cervical cancer population) there has been  observed reductions in morbidity with SLN biopsy compared to pelvic lymphadenectomy. Lymphedema, nerve dysfunction and lymphocysts are all potential risks with the SLN technique as with complete lymphadenectomy. Additional risks to the patient include the risk of damage to an internal organ while operating in an altered view (e.g. the black and white image of the robotic fluorescence imaging mode).   The patient was consented for a robotic assisted hysterectomy, bilateral salpingo-oophorectomy, sentinel lymph node evaluation, possible lymph node dissection, possible laparotomy, vaginal biopsy. The risks of surgery were discussed in detail and she understands these to include infection; wound separation; hernia; vaginal cuff separation, injury to adjacent organs such as bowel, bladder, blood vessels, ureters and nerves; bleeding which may require blood transfusion; anesthesia risk; thromboembolic events; possible death; unforeseen complications; possible need for re-exploration; medical complications such as heart attack, stroke, pleural effusion and pneumonia; and, if full lymphadenectomy is performed the risk of lymphedema and lymphocyst. The patient will receive DVT and antibiotic prophylaxis as indicated. She voiced a clear understanding. She had the opportunity to ask questions. Perioperative instructions were reviewed with her. Prescriptions for post-op medications were sent to her pharmacy of choice.  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 I will have my office reach out to her neurologist for recommendations. Normally, in someone with her history of a provoked VTE after surgery, I would recommend 4 weeks of prophylactic anticoagulation in the setting of major abdominal surgery and a cancer diagnosis. Although her episodes sound concerning for TIA symptoms, she has been followed by neurology with these being called  seizure-like activity.   Plan to biopsy vaginal lesion given its appearance.   A copy of  this note was sent to the patient's referring provider.   90 minutes of total time was spent for this patient encounter, including preparation, face-to-face counseling with the patient and coordination of care, and documentation of the encounter.   Jeral Pinch, MD  Division of Gynecologic Oncology  Department of Obstetrics and Gynecology  [facility]  ___________________________________________  Chief Complaint: Chief Complaint  Patient presents with   Endometrial cancer [facility])    History of Present Illness:  [name] is a 80 y.o. y.o. female who is seen in consultation at the request of Dr. Landry Mellow for an evaluation of endometrial adenocarcinoma  Patient reports menopause at the age of 57.  On September 6 this year, she had a small amount of bleeding that she noted on her panty liner.  She called her OB/GYN that same day.  She had an initial appointment on October 3, underwent ultrasound on the 21st.  Ultrasound showed that her uterus measures 7.5 x 3.2 x 3.4 cm with an endometrial lining measuring 1.3 cm.  Left ovary normal in appearance with a simple cyst, right ovary not seen.  On 11/15, patient underwent hysteroscopy with endometrial sampling.  Final pathology from this showed endometrial adenocarcinoma, endometrioid type, FIGO grade 1.  Findings at the time of surgery were endometrial polyps, vaginal and vulvar atrophy.  Patient notes appetite has been somewhat decreased after surgery.  She denies any nausea or emesis.  She denies any recent weight changes.  She struggles with constipation at baseline and uses MiraLAX and a stool softener as needed.  She denies any urinary symptoms.  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  She was using Premarin until recently but has stopped it since receiving her  diagnosis of endometrial cancer.  Patient's medical history is notable for pulmonary embolism after concurrent breast surgery and tubal ligation in the mid 1970s.  This happened a week after surgery and the patient remembers being on blood thinner for about a month.  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  She presented with aphasia.  She has no lingering deficits but notes that she is on topiramate for prevention of what she describes as "episodes".  She sees a neurologist regularly.  The episodes that she describes are 20 minutes in length when she sees spots and cannot put words together.  She thinks that she has now about 2 of these episodes a year at most.  More recently, the patient thinks that she had a DVT within the last several years and was perhaps on aspirin for a period of time after.  I am able to confirm that this in epic or Care Everywhere.  Patient lives near [place] with her husband.  She has a history of tobacco use but quit in 1991.    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 , tend to happen with stress, no loc is aware of surrounding's,  controlled with topamax  (02-24-2021  pt stated last one approx 6 months ago)   Herpes genitalis    Hiatal hernia 2007   History of Clostridium difficile colitis    per pt yrs ago   History  of COVID-19 04/2019   per pt mild symptoms that resolved   History of hemorrhagic cerebrovascular accident (CVA) with residual deficit 1996   neurologist-- dr c. 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    Lichen sclerosus    labia   Osteopenia    Personal history of PE (pulmonary embolism) 1975   per pt post op breast augmentation/ BTL one week,  stated completed anticoagation, and none before or after   PMB (postmenopausal bleeding)    gyn-- dr Landry Mellow   Thickened endometrium    Vitamin D deficiency    Wears glasses      PAST SURGICAL HISTORY:  Past Surgical History:  Procedure Laterality Date   APPENDECTOMY  1955   AUGMENTATION MAMMAPLASTY Bilateral 9675   silicone   BROW LIFT Bilateral 06/07/2019   Procedure: BLEPHAROPLASTY UPPER EYELID WITH EXCESS SKIN;  Surgeon: Karle Starch, MD;  Location: [place];  Service: Ophthalmology;  Laterality: Bilateral;   CATARACT EXTRACTION W/ INTRAOCULAR LENS IMPLANT Bilateral    left 06/ 2006;  right 08/ 2009   DILATATION & CURETTAGE/HYSTEROSCOPY WITH MYOSURE N/A 03/02/2021   Procedure: DILATATION & CURETTAGE/HYSTEROSCOPY WITH MYOSURE;  Surgeon: Christophe Louis, MD;  Location: [facility];  Service: Gynecology;  Laterality: N/A;   DILATION AND CURETTAGE OF UTERUS  1972   MELANOMA EXCISION  07/1997   left shoulder   [place]   right leg    OB/GYN HISTORY:  OB History  Gravida Para Term Preterm AB Living  1 1          SAB IAB Ectopic Multiple Live Births               # Outcome Date GA Lbr Len/2nd Weight Sex Delivery Anes PTL Lv  1 Para             No LMP recorded. Patient is postmenopausal.  Age at menarche: 73 Age at menopause: 14 Hx of HRT: Denies Hx of STDs: Denies Last pap: 2020 History of abnormal pap smears: Denies  SCREENING STUDIES:  Last mammogram: 2022  Last colonoscopy: 2019  MEDICATIONS: Outpatient Encounter Medications as of  03/10/2021  Medication Sig   acetaminophen (TYLENOL) 500 MG tablet Take 2 tablets (1,000 mg total) by mouth every 8 (eight) hours as needed for moderate pain or mild pain.   clobetasol cream (TEMOVATE) 9.16 % Apply 1 application topically 2 (two) times a week.   docusate sodium (COLACE) 100 MG capsule Take 100 mg by mouth daily as needed for mild constipation.   famotidine (PEPCID) 40 MG tablet Take 40 mg by mouth 2 (two) times daily.   fexofenadine (ALLEGRA) 180 MG tablet Take 180 mg by mouth every evening.   LORazepam (ATIVAN) 1 MG tablet Take 1 mg by mouth at bedtime.   psyllium (METAMUCIL) 58.6 % packet Take 1 packet by mouth daily as needed (constipation).   topiramate (TOPAMAX) 100 MG tablet TAKE 1 TABLET BY MOUTH IN THE MORNING AND 2 IN THE EVENING  traMADol (ULTRAM) 50 MG tablet Take 1 tablet (50 mg total) by mouth every 6 (six) hours as needed for severe pain. For AFTER surgery, do not take and drive   valACYclovir (VALTREX) 500 MG tablet Take 500 mg by mouth every evening.   [DISCONTINUED] Probiotic Product (PROBIOTIC PO) Take by mouth daily. (Patient not taking: Reported on 03/10/2021)   ibuprofen (ADVIL) 800 MG tablet Take 1 tablet (800 mg total) by mouth every 8 (eight) hours as needed for moderate pain or mild pain.   [DISCONTINUED] cholecalciferol (VITAMIN D) 1000 units tablet Take 1,000 Units by mouth daily. (Patient not taking: Reported on 03/10/2021)   [DISCONTINUED] PREMARIN vaginal cream Place 1 applicator vaginally 2 (two) times a week. (Patient not taking: Reported on 03/09/2021)   No facility-administered encounter medications on file as of 03/10/2021.    ALLERGIES:  Allergies  Allergen Reactions   Citalopram     Sleepy, dull feeling   Augmentin [Amoxicillin-Pot Clavulanate] Diarrhea    severe   Doxycycline Diarrhea   Moxifloxacin     Redness, non-healing (eye)   Septra [Sulfamethoxazole-Trimethoprim] Rash   Tape Rash   Zithromax [Azithromycin] Rash      FAMILY HISTORY:  Family History  Problem Relation Age of Onset   Heart attack Mother    Heart attack Father    Diabetes Father    Atrial fibrillation Father    Lymphoma Brother    Cancer Maternal Grandmother        esophageal   Seizures Neg Hx    Colon cancer Neg Hx    Breast cancer Neg Hx    Ovarian cancer Neg Hx    Endometrial cancer Neg Hx    Pancreatic cancer Neg Hx    Prostate cancer Neg Hx      SOCIAL HISTORY:  Social Connections: Not on file    REVIEW OF SYSTEMS:  Denies appetite changes, fevers, chills, fatigue, unexplained weight changes. Denies hearing loss, neck lumps or masses, mouth sores, ringing in ears or voice changes. Denies cough or wheezing.  Denies shortness of breath. Denies chest pain or palpitations. Denies leg swelling. Denies abdominal distention, pain, blood in stools, constipation, diarrhea, nausea, vomiting, or early satiety. Denies pain with intercourse, dysuria, frequency, hematuria or incontinence. Denies hot flashes, pelvic pain, vaginal bleeding or vaginal discharge.   Denies joint pain, back pain or muscle pain/cramps. Denies itching, rash, or wounds. Denies dizziness, headaches, numbness or seizures. Denies swollen lymph nodes or glands, denies easy bruising or bleeding. Denies anxiety, depression, confusion, or decreased concentration.  Physical Exam:  Vital Signs for this encounter:  Blood pressure (!) 167/72, pulse 72, temperature 97.7 F (36.5 C), temperature source Oral, resp. rate 16, height 5\' 3"  (1.6 m), weight 166 lb (75.3 kg), SpO2 100 %. Body mass index is 29.41 kg/m. General: Alert, oriented, no acute distress.  HEENT: Normocephalic, atraumatic. Sclera anicteric.  Chest: Clear to auscultation bilaterally. No wheezes, rhonchi, or rales. Cardiovascular: Regular rate and rhythm, no murmurs, rubs, or gallops.  Abdomen: Normoactive bowel sounds. Soft, nondistended, nontender to palpation. No masses or hepatosplenomegaly  appreciated. No palpable fluid wave.  Well-healed right lower quadrant incision. Extremities: Grossly normal range of motion. Warm, well perfused. No edema bilaterally.  Skin: No rashes.  Multiple small lesions on patient's torso, what appears to be an omphalolith.  Lymphatics: No cervical, supraclavicular, or inguinal adenopathy.  GU:  External female genitalia significantly atrophic with loss of architecture.  There is a 1 cm beefy red lesion on the  right labia, some desquamative areas on the left upper labia..             Bladder/urethra:  No lesions or masses, well supported bladder             Vagina: Moderately atrophic, no lesions or masses seen.             Cervix: Normal appearing, no lesions.             Uterus: Small, mobile, no parametrial involvement or nodularity.             Adnexa: No masses appreciated.  Rectal: Deferred.  LABORATORY AND RADIOLOGIC DATA:  Outside medical records were reviewed to synthesize the above history, along with the history and physical obtained during the visit.   Lab Results  Component Value Date   WBC 4.6 09/01/2006   HGB 14.8 09/01/2006   HCT 43.1 09/01/2006   PLT 222 09/01/2006   GLUCOSE 107 (H) 09/01/2006   CHOL 235 (HH) 09/01/2006   TRIG 67 09/01/2006   HDL 75.5 09/01/2006   LDLDIRECT 135.4 09/01/2006   ALT 19 09/01/2006   AST 21 09/01/2006   NA 138 09/01/2006   K 3.7 09/01/2006   CL 105 09/01/2006   CREATININE 0.7 09/01/2006   BUN 17 09/01/2006   CO2 24 09/01/2006   HGBA1C 5.4 09/01/2006

## 2021-03-10 ENCOUNTER — Other Ambulatory Visit: Payer: Self-pay

## 2021-03-10 ENCOUNTER — Encounter: Payer: Self-pay | Admitting: Gynecologic Oncology

## 2021-03-10 ENCOUNTER — Inpatient Hospital Stay: Payer: PPO | Attending: Gynecologic Oncology | Admitting: Gynecologic Oncology

## 2021-03-10 ENCOUNTER — Inpatient Hospital Stay (HOSPITAL_BASED_OUTPATIENT_CLINIC_OR_DEPARTMENT_OTHER): Payer: PPO | Admitting: Gynecologic Oncology

## 2021-03-10 VITALS — BP 167/72 | HR 72 | Temp 97.7°F | Resp 16 | Ht 63.0 in | Wt 166.0 lb

## 2021-03-10 DIAGNOSIS — Z87891 Personal history of nicotine dependence: Secondary | ICD-10-CM | POA: Insufficient documentation

## 2021-03-10 DIAGNOSIS — M8580 Other specified disorders of bone density and structure, unspecified site: Secondary | ICD-10-CM | POA: Diagnosis not present

## 2021-03-10 DIAGNOSIS — K589 Irritable bowel syndrome without diarrhea: Secondary | ICD-10-CM | POA: Insufficient documentation

## 2021-03-10 DIAGNOSIS — K5900 Constipation, unspecified: Secondary | ICD-10-CM | POA: Insufficient documentation

## 2021-03-10 DIAGNOSIS — C541 Malignant neoplasm of endometrium: Secondary | ICD-10-CM | POA: Diagnosis not present

## 2021-03-10 DIAGNOSIS — Z8673 Personal history of transient ischemic attack (TIA), and cerebral infarction without residual deficits: Secondary | ICD-10-CM | POA: Insufficient documentation

## 2021-03-10 DIAGNOSIS — Z8616 Personal history of COVID-19: Secondary | ICD-10-CM | POA: Diagnosis not present

## 2021-03-10 DIAGNOSIS — Z85820 Personal history of malignant melanoma of skin: Secondary | ICD-10-CM | POA: Insufficient documentation

## 2021-03-10 DIAGNOSIS — B009 Herpesviral infection, unspecified: Secondary | ICD-10-CM | POA: Insufficient documentation

## 2021-03-10 DIAGNOSIS — Z86718 Personal history of other venous thrombosis and embolism: Secondary | ICD-10-CM | POA: Diagnosis not present

## 2021-03-10 DIAGNOSIS — Z79899 Other long term (current) drug therapy: Secondary | ICD-10-CM | POA: Insufficient documentation

## 2021-03-10 DIAGNOSIS — Z86711 Personal history of pulmonary embolism: Secondary | ICD-10-CM | POA: Insufficient documentation

## 2021-03-10 MED ORDER — TRAMADOL HCL 50 MG PO TABS: 50.0000 mg | ORAL_TABLET | Freq: Four times a day (QID) | ORAL | 0 refills | Status: DC | PRN

## 2021-03-10 NOTE — Patient Instructions (Signed)
Preparing for your Surgery   Plan for surgery on March 25, 2021 with Dr. Jeral Pinch at [place] will be scheduled for robotic assisted total laparoscopic hysterectomy (removal of the uterus and cervix), bilateral salpingo-oophorectomy (removal of both ovaries and fallopian tubes), sentinel lymph node biopsy, possible lymph node dissection, possible laparotomy (larger incision on your abdomen if needed), vulvar biopsies.    We are going to reach out to your primary care provider and your neurologist for clearance for surgery and clarification about blood thinners around the time of surgery. Dr. Berline Lopes is recommending you receive a low dose blood thinner injection called heparin before surgery and would recommend being on a low dose blood thinner for around 2 weeks after to prevent blood clots given your history of blood clots in the past. She would also like the neurologist to evaluate the symptoms you are having for possible TIAs.   Pre-operative Testing -You will receive a phone call from presurgical testing at [facility] to arrange for a pre-operative appointment and lab work.   -Bring your insurance card, copy of an advanced directive if applicable, medication list   -At that visit, you will be asked to sign a consent for a possible blood transfusion in case a transfusion becomes necessary during surgery.  The need for a blood transfusion is rare but having consent is a necessary part of your care.      -You should not be taking blood thinners or aspirin at least ten days prior to surgery unless instructed by your surgeon.   -Do not take supplements such as fish oil (omega 3), red yeast rice, turmeric before your surgery. You want to avoid medications with aspirin in them including headache powders such as BC or Goody's), Excedrin migraine.   Day Before Surgery at [place] will be asked to take in a light diet the day before surgery. You will be advised you  can have clear liquids up until 3 hours before your surgery.     Eat a light diet the day before surgery.  Examples including soups, broths, toast, yogurt, mashed potatoes.  AVOID GAS PRODUCING FOODS. Things to avoid include carbonated beverages (fizzy beverages, sodas), raw fruits and raw vegetables (uncooked), or beans.    If your bowels are filled with gas, your surgeon will have difficulty visualizing your pelvic organs which increases your surgical risks.   Your role in recovery Your role is to become active as soon as directed by your doctor, while still giving yourself time to heal.  Rest when you feel tired. You will be asked to do the following in order to speed your recovery:   - Cough and breathe deeply. This helps to clear and expand your lungs and can prevent pneumonia after surgery.  - [place]. Do mild physical activity. Walking or moving your legs help your circulation and body functions return to normal. Do not try to get up or walk alone the first time after surgery.   -If you develop swelling on one leg or the other, pain in the back of your leg, redness/warmth in one of your legs, please call the office or go to the Emergency Room to have a doppler to rule out a blood clot. For shortness of breath, chest pain-seek care in the Emergency Room as soon as possible. - Actively manage your pain. Managing your pain lets you move in comfort. We will ask you to rate your pain on  a scale of zero to 10. It is your responsibility to tell your doctor or nurse where and how much you hurt so your pain can be treated.   Special Considerations -If you are diabetic, you may be placed on insulin after surgery to have closer control over your blood sugars to promote healing and recovery.  This does not mean that you will be discharged on insulin.  If applicable, your oral antidiabetics will be resumed when you are tolerating a solid diet.   -Your final pathology results from  surgery should be available around one week after surgery and the results will be relayed to you when available.   -Dr. Lahoma Crocker is the surgeon that assists your GYN Oncologist with surgery.  If you end up staying the night, the next day after your surgery you will either see Dr. Berline Lopes or Dr. Lahoma Crocker.   -FMLA forms can be faxed to [phone] and please allow 5-7 business days for completion.   Pain Management After Surgery -You have been prescribed your pain medication (tramadol) before surgery so that you can have these available when you are discharged from the hospital. The pain medication is for use ONLY AFTER surgery and a new prescription will not be given.    -Make sure that you have Tylenol and Ibuprofen at home to use on a regular basis after surgery for pain control. We recommend alternating the medications every hour to six hours since they work differently and are processed in the body differently for pain relief.   -Review the attached handout on narcotic use and their risks and side effects.    Bowel Regimen -You can keep taking Miralax once a day before and after surgery along with the stool softener if needed.   Risks of Surgery Risks of surgery are low but include bleeding, infection, damage to surrounding structures, re-operation, blood clots, and very rarely death.     Blood Transfusion Information (For the consent to be signed before surgery)   We will be checking your blood type before surgery so in case of emergencies, we will know what type of blood you would need.                                             WHAT IS A BLOOD TRANSFUSION?   A transfusion is the replacement of blood or some of its parts. Blood is made up of multiple cells which provide different functions. Red blood cells carry oxygen and are used for blood loss replacement. White blood cells fight against infection. Platelets control bleeding. Plasma helps clot blood. Other  blood products are available for specialized needs, such as hemophilia or other clotting disorders. BEFORE THE TRANSFUSION  Who gives blood for transfusions?  You may be able to donate blood to be used at a later date on yourself (autologous donation). Relatives can be asked to donate blood. This is generally not any safer than if you have received blood from a stranger. The same precautions are taken to ensure safety when a relative's blood is donated. Healthy volunteers who are fully evaluated to make sure their blood is safe. This is blood bank blood. Transfusion therapy is the safest it has ever been in the practice of medicine. Before blood is taken from a donor, a complete history is taken to make sure that person has no history of  diseases nor engages in risky social behavior (examples are intravenous drug use or sexual activity with multiple partners). The donor's travel history is screened to minimize risk of transmitting infections, such as malaria. The donated blood is tested for signs of infectious diseases, such as HIV and hepatitis. The blood is then tested to be sure it is compatible with you in order to minimize the chance of a transfusion reaction. If you or a relative donates blood, this is often done in anticipation of surgery and is not appropriate for emergency situations. It takes many days to process the donated blood. RISKS AND COMPLICATIONS Although transfusion therapy is very safe and saves many lives, the main dangers of transfusion include:  Getting an infectious disease. Developing a transfusion reaction. This is an allergic reaction to something in the blood you were given. Every precaution is taken to prevent this. The decision to have a blood transfusion has been considered carefully by your caregiver before blood is given. Blood is not given unless the benefits outweigh the risks.   AFTER SURGERY INSTRUCTIONS   Return to work: 4-6 weeks if applicable   Activity: 1.  Be up and out of the bed during the day.  Take a nap if needed.  You may walk up steps but be careful and use the hand rail.  Stair climbing will tire you more than you think, you may need to stop part way and rest.    2. No lifting or straining for 6 weeks over 10 pounds. No pushing, pulling, straining for 6 weeks.   3. No driving for 1 week(s).  Do not drive if you are taking narcotic pain medicine and make sure that your reaction time has returned.    4. You can shower as soon as the next day after surgery. Shower daily.  Use your regular soap and water (not directly on the incision) and pat your incision(s) dry afterwards; don't rub.  No tub baths or submerging your body in water until cleared by your surgeon. If you have the soap that was given to you by pre-surgical testing that was used before surgery, you do not need to use it afterwards because this can irritate your incisions.    5. No sexual activity and nothing in the vagina for 8 weeks.   6. You may experience a small amount of clear drainage from your incisions, which is normal.  If the drainage persists, increases, or changes color please call the office.   7. Do not use creams, lotions, or ointments such as neosporin on your incisions after surgery until advised by your surgeon because they can cause removal of the dermabond glue on your incisions.     8. You may experience vaginal spotting after surgery or around the 6-8 week mark from surgery when the stitches at the top of the vagina begin to dissolve.  The spotting is normal but if you experience heavy bleeding, call our office.   9. Take Tylenol or ibuprofen first for pain and only use narcotic pain medication for severe pain not relieved by the Tylenol or Ibuprofen.  Monitor your Tylenol intake to a max of 4,000 mg in a 24 hour period. You can alternate these medications after surgery.   Diet: 1. Low sodium Heart Healthy Diet is recommended but you are cleared to resume your  normal (before surgery) diet after your procedure.   2. It is safe to use a laxative, such as Miralax or Colace, if you have difficulty  moving your bowels. You need to take something everyday to prevent constipation.   Wound Care: 1. Keep clean and dry.  Shower daily.   Reasons to call the Doctor: Fever - Oral temperature greater than 100.4 degrees Fahrenheit Foul-smelling vaginal discharge Difficulty urinating Nausea and vomiting Increased pain at the site of the incision that is unrelieved with pain medicine. Difficulty breathing with or without chest pain New calf pain especially if only on one side Sudden, continuing increased vaginal bleeding with or without clots.   Contacts: For questions or concerns you should contact:   Dr. Jeral Pinch    at [phone]   [name], NP at [phone]   After Hours: call [phone] and have the GYN Oncologist paged/contacted (after 5 pm or on the weekends).   Messages sent via mychart are for non-urgent matters and are not responded to after hours so for urgent needs, please call the after hours number.

## 2021-03-10 NOTE — Patient Instructions (Signed)
Preparing for your Surgery  Plan for surgery on March 25, 2021 with Dr. Jeral Pinch at [place] will be scheduled for robotic assisted total laparoscopic hysterectomy (removal of the uterus and cervix), bilateral salpingo-oophorectomy (removal of both ovaries and fallopian tubes), sentinel lymph node biopsy, possible lymph node dissection, possible laparotomy (larger incision on your abdomen if needed), vulvar biopsies.   We are going to reach out to your primary care provider and your neurologist for clearance for surgery and clarification about blood thinners around the time of surgery. Dr. Berline Lopes is recommending you receive a low dose blood thinner injection called heparin before surgery and would recommend being on a low dose blood thinner for around 2 weeks after to prevent blood clots given your history of blood clots in the past. She would also like the neurologist to evaluate the symptoms you are having for possible TIAs.  Pre-operative Testing -You will receive a phone call from presurgical testing at [facility] to arrange for a pre-operative appointment and lab work.  -Bring your insurance card, copy of an advanced directive if applicable, medication list  -At that visit, you will be asked to sign a consent for a possible blood transfusion in case a transfusion becomes necessary during surgery.  The need for a blood transfusion is rare but having consent is a necessary part of your care.     -You should not be taking blood thinners or aspirin at least ten days prior to surgery unless instructed by your surgeon.  -Do not take supplements such as fish oil (omega 3), red yeast rice, turmeric before your surgery. You want to avoid medications with aspirin in them including headache powders such as BC or Goody's), Excedrin migraine.  Day Before Surgery at [place] will be asked to take in a light diet the day before surgery. You will be advised you can have  clear liquids up until 3 hours before your surgery.    Eat a light diet the day before surgery.  Examples including soups, broths, toast, yogurt, mashed potatoes.  AVOID GAS PRODUCING FOODS. Things to avoid include carbonated beverages (fizzy beverages, sodas), raw fruits and raw vegetables (uncooked), or beans.   If your bowels are filled with gas, your surgeon will have difficulty visualizing your pelvic organs which increases your surgical risks.  Your role in recovery Your role is to become active as soon as directed by your doctor, while still giving yourself time to heal.  Rest when you feel tired. You will be asked to do the following in order to speed your recovery:  - Cough and breathe deeply. This helps to clear and expand your lungs and can prevent pneumonia after surgery.  - [place]. Do mild physical activity. Walking or moving your legs help your circulation and body functions return to normal. Do not try to get up or walk alone the first time after surgery.   -If you develop swelling on one leg or the other, pain in the back of your leg, redness/warmth in one of your legs, please call the office or go to the Emergency Room to have a doppler to rule out a blood clot. For shortness of breath, chest pain-seek care in the Emergency Room as soon as possible. - Actively manage your pain. Managing your pain lets you move in comfort. We will ask you to rate your pain on a scale of zero to 10. It is your responsibility to tell  your doctor or nurse where and how much you hurt so your pain can be treated.  Special Considerations -If you are diabetic, you may be placed on insulin after surgery to have closer control over your blood sugars to promote healing and recovery.  This does not mean that you will be discharged on insulin.  If applicable, your oral antidiabetics will be resumed when you are tolerating a solid diet.  -Your final pathology results from surgery should be  available around one week after surgery and the results will be relayed to you when available.  -Dr. Lahoma Crocker is the surgeon that assists your GYN Oncologist with surgery.  If you end up staying the night, the next day after your surgery you will either see Dr. Berline Lopes or Dr. Lahoma Crocker.  -FMLA forms can be faxed to [phone] and please allow 5-7 business days for completion.  Pain Management After Surgery -You have been prescribed your pain medication (tramadol) before surgery so that you can have these available when you are discharged from the hospital. The pain medication is for use ONLY AFTER surgery and a new prescription will not be given.   -Make sure that you have Tylenol and Ibuprofen at home to use on a regular basis after surgery for pain control. We recommend alternating the medications every hour to six hours since they work differently and are processed in the body differently for pain relief.  -Review the attached handout on narcotic use and their risks and side effects.   Bowel Regimen -You can keep taking Miralax once a day before and after surgery along with the stool softener if needed.  Risks of Surgery Risks of surgery are low but include bleeding, infection, damage to surrounding structures, re-operation, blood clots, and very rarely death.   Blood Transfusion Information (For the consent to be signed before surgery)  We will be checking your blood type before surgery so in case of emergencies, we will know what type of blood you would need.                                            WHAT IS A BLOOD TRANSFUSION?  A transfusion is the replacement of blood or some of its parts. Blood is made up of multiple cells which provide different functions. Red blood cells carry oxygen and are used for blood loss replacement. White blood cells fight against infection. Platelets control bleeding. Plasma helps clot blood. Other blood products are available  for specialized needs, such as hemophilia or other clotting disorders. BEFORE THE TRANSFUSION  Who gives blood for transfusions?  You may be able to donate blood to be used at a later date on yourself (autologous donation). Relatives can be asked to donate blood. This is generally not any safer than if you have received blood from a stranger. The same precautions are taken to ensure safety when a relative's blood is donated. Healthy volunteers who are fully evaluated to make sure their blood is safe. This is blood bank blood. Transfusion therapy is the safest it has ever been in the practice of medicine. Before blood is taken from a donor, a complete history is taken to make sure that person has no history of diseases nor engages in risky social behavior (examples are intravenous drug use or sexual activity with multiple partners). The donor's travel history is screened to minimize  risk of transmitting infections, such as malaria. The donated blood is tested for signs of infectious diseases, such as HIV and hepatitis. The blood is then tested to be sure it is compatible with you in order to minimize the chance of a transfusion reaction. If you or a relative donates blood, this is often done in anticipation of surgery and is not appropriate for emergency situations. It takes many days to process the donated blood. RISKS AND COMPLICATIONS Although transfusion therapy is very safe and saves many lives, the main dangers of transfusion include:  Getting an infectious disease. Developing a transfusion reaction. This is an allergic reaction to something in the blood you were given. Every precaution is taken to prevent this. The decision to have a blood transfusion has been considered carefully by your caregiver before blood is given. Blood is not given unless the benefits outweigh the risks.  AFTER SURGERY INSTRUCTIONS  Return to work: 4-6 weeks if applicable  Activity: 1. Be up and out of the bed during  the day.  Take a nap if needed.  You may walk up steps but be careful and use the hand rail.  Stair climbing will tire you more than you think, you may need to stop part way and rest.   2. No lifting or straining for 6 weeks over 10 pounds. No pushing, pulling, straining for 6 weeks.  3. No driving for 1 week(s).  Do not drive if you are taking narcotic pain medicine and make sure that your reaction time has returned.   4. You can shower as soon as the next day after surgery. Shower daily.  Use your regular soap and water (not directly on the incision) and pat your incision(s) dry afterwards; don't rub.  No tub baths or submerging your body in water until cleared by your surgeon. If you have the soap that was given to you by pre-surgical testing that was used before surgery, you do not need to use it afterwards because this can irritate your incisions.   5. No sexual activity and nothing in the vagina for 8 weeks.  6. You may experience a small amount of clear drainage from your incisions, which is normal.  If the drainage persists, increases, or changes color please call the office.  7. Do not use creams, lotions, or ointments such as neosporin on your incisions after surgery until advised by your surgeon because they can cause removal of the dermabond glue on your incisions.    8. You may experience vaginal spotting after surgery or around the 6-8 week mark from surgery when the stitches at the top of the vagina begin to dissolve.  The spotting is normal but if you experience heavy bleeding, call our office.  9. Take Tylenol or ibuprofen first for pain and only use narcotic pain medication for severe pain not relieved by the Tylenol or Ibuprofen.  Monitor your Tylenol intake to a max of 4,000 mg in a 24 hour period. You can alternate these medications after surgery.  Diet: 1. Low sodium Heart Healthy Diet is recommended but you are cleared to resume your normal (before surgery) diet after your  procedure.  2. It is safe to use a laxative, such as Miralax or Colace, if you have difficulty moving your bowels. You need to take something everyday to prevent constipation.  Wound Care: 1. Keep clean and dry.  Shower daily.  Reasons to call the Doctor: Fever - Oral temperature greater than 100.4 degrees Fahrenheit Foul-smelling  vaginal discharge Difficulty urinating Nausea and vomiting Increased pain at the site of the incision that is unrelieved with pain medicine. Difficulty breathing with or without chest pain New calf pain especially if only on one side Sudden, continuing increased vaginal bleeding with or without clots.   Contacts: For questions or concerns you should contact:  Dr. Jeral Pinch    at [phone]  [name], NP at [phone]  After Hours: call [phone] and have the GYN Oncologist paged/contacted (after 5 pm or on the weekends).  Messages sent via mychart are for non-urgent matters and are not responded to after hours so for urgent needs, please call the after hours number.

## 2021-03-10 NOTE — Progress Notes (Signed)
Patient here with her husband for new patient consultation with Dr. Jeral Pinch and for a pre-operative discussion prior to her scheduled surgery on March 25, 2021. She is scheduled for robotic assisted total laparoscopic hysterectomy, bilateral salpingo-oophorectomy, sentinel lymph node biopsy, possible lymph node dissection, possible laparotomy, vulvar biopsies. The surgery was discussed in detail.  See after visit summary for additional details. Visual aids used to discuss items related to surgery including sequential compression stockings, foley catheter, IV pump, multi-modal pain regimen including tylenol, photo of the surgical robot, female reproductive system to discuss surgery in detail.      Discussed post-op pain management in detail including the aspects of the enhanced recovery pathway.  Advised her that a new prescription would be sent in for tramadol and it is only to be used for after her upcoming surgery.  We discussed the use of tylenol post-op and to monitor for a maximum of 4,000 mg in a 24 hour period. Discussed bowel regimen in detail. She will plan to continue her home regimen of Miralax and stool softeners if needed.     Discussed the recommendation for use of heparin pre-op (this will be checked with her neurologist and PCP given history), SCDs, and measures to take at home to prevent DVT including frequent mobility.  Reportable signs and symptoms of DVT discussed. Post-operative instructions discussed and expectations for after surgery. Incisional care discussed as well including reportable signs and symptoms including erythema, drainage, wound separation.     10 minutes spent with the patient.  Verbalizing understanding of material discussed. No needs or concerns voiced at the end of the visit. Advised patient and family to call for any needs.  Advised that her post-operative medications had been prescribed and could be picked up at any time.   Also advised our office would  be reaching out to her primary care provider and neurologist for clearance for surgery and clarification about blood thinners around the time of surgery. Dr. Berline Lopes is recommending the patient receive a low dose blood thinner injection (heparin) before surgery and would recommend being on a low dose blood thinner (lovenox prop dose) for around 4 weeks after to prevent blood clots given her history of blood clots in the past. She would also like the neurologist to evaluate her neuro symptoms.  This appointment is included in the global surgical bundle as pre-operative teaching and has no charge.

## 2021-03-10 NOTE — Progress Notes (Signed)
Sent message, via epic in basket, requesting orders in epic from surgeon.  

## 2021-03-12 ENCOUNTER — Telehealth: Payer: Self-pay | Admitting: *Deleted

## 2021-03-12 NOTE — Telephone Encounter (Signed)
Fax surgical optimization form and records to patient's PCP and neurology

## 2021-03-15 ENCOUNTER — Telehealth: Payer: Self-pay | Admitting: Adult Health

## 2021-03-15 ENCOUNTER — Encounter (HOSPITAL_COMMUNITY): Payer: Self-pay

## 2021-03-15 NOTE — Telephone Encounter (Signed)
Pt called states she has a question about a treatment. Pt is scheduled for surgery tomorrow. Pt requesting a call back.

## 2021-03-15 NOTE — Patient Instructions (Addendum)
DUE TO COVID-19 ONLY ONE VISITOR IS ALLOWED TO COME WITH YOU AND STAY IN THE WAITING ROOM ONLY DURING PRE OP AND PROCEDURE DAY OF SURGERY.   Up to two visitors ages 16+ are allowed at one time in a patient's room.  The visitors may rotate out with other people throughout the day.  Additionally, up to two children between the ages of 19 and 34 are allowed and do not count toward the number of allowed visitors.  Children within this age range must be accompanied by an adult visitor.  One adult visitor may remain with the patient overnight and must be in the room by 8 PM.            Your procedure is scheduled on: 03-25-21   Report to [facility] Main  Entrance   Report to admitting at      [place] AM     Call this number if you have problems the morning of surgery [phone]   Remember: DRINK 2 PRESURGERY ENSURE DRINKS THE NIGHT BEFORE SURGERY AT   1000 PM AND 1 PRESURGERY DRINK THE DAY OF THE PROCEDURE 3 HOURS PRIOR TO SCHEDULED SURGERY.   NOTHING BY MOUTH EXCEPT CLEAR LIQUIDS UNTIL THREE HOURS PRIOR TO SCHEDULED SURGERY.   PLEASE FINISH PRESURGERY ENSURE DRINK PER SURGEON ORDER 3 HOURS PRIOR TO SCHEDULED SURGERY TIME WHICH NEEDS   TO BE COMPLETED AT __0830 am then nothing by mouth_______.      CLEAR LIQUID DIET                                                                    water Black Coffee and tea, regular and decaf No Creamer                            Plain Jell-O any favor except red or purple                                  Fruit ices (not with fruit pulp)                                      Iced Popsicles                                                                     Cranberry, grape and apple juices Sports drinks like Gatorade Lightly seasoned clear broth or consume(fat free) Sugar, honey syrup  _____________________________________________________________________    BRUSH YOUR TEETH MORNING OF SURGERY AND RINSE YOUR MOUTH OUT, NO CHEWING GUM CANDY  OR MINTS.     Take these medicines the morning of surgery with A SIP OF WATER: Topiramate, famotidine, tylenol if needed  You may not have any metal on your body including hair pins and              piercings  Do not wear jewelry, make-up, lotions, powders,perfumes,   or     deodorant             Do not wear nail polish on your fingernails or toenails .  Do not shave  48 hours prior to surgery.               Do not bring valuables to the hospital. [place].  Contacts, dentures or bridgework may not be worn into surgery.       Patients discharged the day of surgery will not be allowed to drive home. IF YOU ARE HAVING SURGERY AND GOING HOME THE SAME DAY, YOU MUST HAVE AN ADULT TO DRIVE YOU HOME AND BE WITH YOU FOR 24 HOURS. YOU MAY GO HOME BY TAXI OR UBER OR ORTHERWISE, BUT AN ADULT MUST ACCOMPANY YOU HOME AND STAY WITH YOU FOR 24 HOURS.  Name and phone number of your driver:  Special Instructions: N/A              Please read over the following fact sheets you were given: _____________________________________________________________________             [facility] - Preparing for Surgery Before surgery, you can play an important role.  Because skin is not sterile, your skin needs to be as free of germs as possible.  You can reduce the number of germs on your skin by washing with CHG (chlorahexidine gluconate) soap before surgery.  CHG is an antiseptic cleaner which kills germs and bonds with the skin to continue killing germs even after washing. Please DO NOT use if you have an allergy to CHG or antibacterial soaps.  If your skin becomes reddened/irritated stop using the CHG and inform your nurse when you arrive at Short Stay. Do not shave (including legs and underarms) for at least 48 hours prior to the first CHG shower.  You may shave your face/neck. Please follow these instructions carefully:  1.   Shower with CHG Soap the night before surgery and the  morning of Surgery.  2.  If you choose to wash your hair, wash your hair first as usual with your  normal  shampoo.  3.  After you shampoo, rinse your hair and body thoroughly to remove the  shampoo.                           4.  Use CHG as you would any other liquid soap.  You can apply chg directly  to the skin and wash                       Gently with a scrungie or clean washcloth.  5.  Apply the CHG Soap to your body ONLY FROM THE NECK DOWN.   Do not use on face/ open                           Wound or open sores. Avoid contact with eyes, ears mouth and genitals (private parts).  Wash face,  Genitals (private parts) with your normal soap.             6.  Wash thoroughly, paying special attention to the area where your surgery  will be performed.  7.  Thoroughly rinse your body with warm water from the neck down.  8.  DO NOT shower/wash with your normal soap after using and rinsing off  the CHG Soap.                9.  Pat yourself dry with a clean towel.            10.  Wear clean pajamas.            11.  Place clean sheets on your bed the night of your first shower and do not  sleep with pets. Day of Surgery : Do not apply any lotions/deodorants the morning of surgery.  Please wear clean clothes to the hospital/surgery center.  FAILURE TO FOLLOW THESE INSTRUCTIONS MAY RESULT IN THE CANCELLATION OF YOUR SURGERY PATIENT SIGNATURE_________________________________  NURSE SIGNATURE__________________________________  ________________________________________________________________________

## 2021-03-15 NOTE — Telephone Encounter (Signed)
I spoke with the patient and her husband. The patient has a surgery scheduled for 03/25/21. Surgery: robotic assist laparoscopic hysterectomy with bilateral salpingo-oophorectomy, sentinel lymph bx, possible lymph dissection, possible laparotomy, possible vulvar bx. General anesthesia. Indication: endometrial cancer. Surgeon: Jeral Pinch MD.   We also received a request from Dr Charisse March office via fax asking for 1. neurological clearance from surgery and 2. clarification about blood thinners around the time of surgery. Dr Berline Lopes is recommending that patient receive low dose heparin before the surgery and then a low dose blood thinner for around 2 weeks after to prevent blood clots given pt's history of blood clots in the past. 3. She would also like evaluation for pt's symptoms, seeing black spots and difficulty putting words together, for possible TIAs.   I spoke with the patient. She made it very clear that the symptoms Dr Berline Lopes was referring to have already been addressed with [facility] NP and nothing is new. The patient and her husband are also very concerned about her being placed on a blood thinner due to her history of a brain bleed back in 1996. She states she cannot even take aspirin now and is concerned about taking a blood thinner for surgery. She does understand that are risks for blood clots with surgery but she is concerned due to her personal history of a brain bleed. She recalls a history of multiple PEs in 1975 which occurred the week after a surgery. She states she might have had a clot in her leg sometime after that and was treated with aspirin by Dr Laurann Montana (PCP). I let the pt know that I would address her concerns with [facility] NP and would call her back tomorrow. Pt will be unavailable from 10-12 on 11/29 due to pre-op activities. She was very appreciative for the call.

## 2021-03-15 NOTE — Progress Notes (Addendum)
PCP - Lavone Orn, MD Cardiologist - no  PPM/ICD -  Device Orders -  Rep Notified -   Chest x-ray - 09-23-20 EKG -  Stress Test -  ECHO -  Cardiac Cath -   Sleep Study -  CPAP -   Fasting Blood Sugar -  Checks Blood Sugar _____ times a day  Blood Thinner Instructions: Aspirin Instructions:  ERAS Protcol - PRE-SURGERY Ensure or G2-   COVID TEST- N/A COVID vaccine-  Activity-- Able to walk a flight of stairs without SOB Anesthesia review: ELEVATED LIVER ENZYMES NOTE IN Epic BY MELISSA CROSS,NP  Patient denies shortness of breath, fever, cough and chest pain at PAT appointment   All instructions explained to the patient, with a verbal understanding of the material. Patient agrees to go over the instructions while at home for a better understanding. Patient also instructed to self quarantine after being tested for COVID-19. The opportunity to ask questions was provided.

## 2021-03-16 ENCOUNTER — Telehealth: Payer: Self-pay

## 2021-03-16 ENCOUNTER — Encounter (HOSPITAL_COMMUNITY): Payer: Self-pay

## 2021-03-16 ENCOUNTER — Telehealth: Payer: Self-pay | Admitting: *Deleted

## 2021-03-16 ENCOUNTER — Encounter (HOSPITAL_COMMUNITY)
Admission: RE | Admit: 2021-03-16 | Discharge: 2021-03-16 | Disposition: A | Discharge status: Home / Self Care | Payer: PPO | Source: Ambulatory Visit | Attending: Gynecologic Oncology | Admitting: Gynecologic Oncology

## 2021-03-16 ENCOUNTER — Other Ambulatory Visit: Payer: Self-pay

## 2021-03-16 ENCOUNTER — Other Ambulatory Visit: Payer: Self-pay | Admitting: Gynecologic Oncology

## 2021-03-16 DIAGNOSIS — Z01812 Encounter for preprocedural laboratory examination: Secondary | ICD-10-CM | POA: Insufficient documentation

## 2021-03-16 DIAGNOSIS — C541 Malignant neoplasm of endometrium: Secondary | ICD-10-CM | POA: Insufficient documentation

## 2021-03-16 DIAGNOSIS — R7989 Other specified abnormal findings of blood chemistry: Secondary | ICD-10-CM

## 2021-03-16 HISTORY — DX: Gastro-esophageal reflux disease without esophagitis: K21.9

## 2021-03-16 HISTORY — DX: Malignant (primary) neoplasm, unspecified: C80.1

## 2021-03-16 HISTORY — DX: Pneumonia, unspecified organism: J18.9

## 2021-03-16 LAB — COMPREHENSIVE METABOLIC PANEL
ALT: 628 U/L — ABNORMAL HIGH (ref 0–44)
AST: 204 U/L — ABNORMAL HIGH (ref 15–41)
Albumin: 4.1 g/dL (ref 3.5–5.0)
Alkaline Phosphatase: 397 U/L — ABNORMAL HIGH (ref 38–126)
Anion gap: 5 (ref 5–15)
BUN: 15 mg/dL (ref 8–23)
CO2: 23 mmol/L (ref 22–32)
Calcium: 9.2 mg/dL (ref 8.9–10.3)
Chloride: 103 mmol/L (ref 98–111)
Creatinine, Ser: 0.78 mg/dL (ref 0.44–1.00)
GFR, Estimated: >60 mL/min (ref >60)
Glucose, Bld: 102 mg/dL — ABNORMAL HIGH (ref 70–99)
Potassium: 4.5 mmol/L (ref 3.5–5.1)
Sodium: 131 mmol/L — ABNORMAL LOW (ref 135–145)
Total Bilirubin: 0.6 mg/dL (ref 0.3–1.2)
Total Protein: 7 g/dL (ref 6.5–8.1)

## 2021-03-16 LAB — CBC
HCT: 42.8 % (ref 36.0–46.0)
Hemoglobin: 14.3 g/dL (ref 12.0–15.0)
MCH: 32.9 pg (ref 26.0–34.0)
MCHC: 33.4 g/dL (ref 30.0–36.0)
MCV: 98.6 fL (ref 80.0–100.0)
Platelets: 192 10*3/uL (ref 150–400)
RBC: 4.34 MIL/uL (ref 3.87–5.11)
RDW: 13.3 % (ref 11.5–15.5)
WBC: 5.7 10*3/uL (ref 4.0–10.5)
nRBC: 0 % (ref 0.0–0.2)

## 2021-03-16 NOTE — Telephone Encounter (Signed)
Spoke with [name] this afternoon. Discussed CMP results from today. Explained to patient that liver enzymes were elevated. AST 204, ALT 628 and alkaline phosphatase 397. We need to evaluate the cause of the high liver enzymes.  Latoya John, NP has ordered an abdominal ultrasound. This has been scheduled for 03/22/21 at 0830. Patient is to arrive at [facility] long at [facility] and is to be NPO 4 hrs prior. Patient needs to see PCP [name] as soon as possible. RN spoke with [name] at Dr. Delene Ruffini office and they are able to work her in on 03/23/21 at 11:45 am.  Instructed patient to avoid tylenol and alcohol.  Patient verbalized understanding of above information. Provided patient with office number of [phone] and to call if she has any questions.

## 2021-03-16 NOTE — Telephone Encounter (Signed)
Late entry----on 11/25 fax records and surgical optimization form to the patient's PCP  On 11/29 fax records, labs and surgical optimization form to Dr Delene Ruffini office per their request

## 2021-03-16 NOTE — Telephone Encounter (Signed)
Late entry-----fax records and surgical optimization form to patient's neurology office  On 11/29 called neurology office and Jinny Blossom, NP is reviewing the records

## 2021-03-16 NOTE — Telephone Encounter (Signed)
Calling to discuss CMP results with patient. Patient requests a call in about ten minutes. Will call the patient back.

## 2021-03-16 NOTE — Progress Notes (Signed)
Due to elevated liver enzymes on preop labs, a RUQ [place] has been ordered. She has newly diagnosed endometrial cancer and is for surgery on 03/25/2021.

## 2021-03-17 NOTE — Telephone Encounter (Signed)
Lets Discuss this in person

## 2021-03-17 NOTE — Telephone Encounter (Signed)
Received clearance from patient's PCP; copy sent to pre surgical center

## 2021-03-18 ENCOUNTER — Telehealth: Payer: Self-pay

## 2021-03-18 NOTE — Telephone Encounter (Signed)
Received call from [facility] at [facility] regarding surgical optimization form and low dose blood thinner post operatively.   She is seeking clarification on what blood thinner will be used post-operatively and for how long.  Per [name], NP patient will be on lovenox 40 mg daily for 4 weeks. She verbalized understanding. Instructed to call with any questions or concerns.

## 2021-03-18 NOTE — Telephone Encounter (Addendum)
Received a call back from [facility] w/ GYN oncology (Dr Berline Lopes). The recommendation for after-surgery blood thinner is Lovenox 40 mg daily x 4 weeks. She also received clarification that the patient doesn't necessarily need a follow-up for her symptoms as long as our office documents on their sheet that the patient has been talked to and we do not feel she needs to be evaluated.

## 2021-03-18 NOTE — Telephone Encounter (Signed)
I relayed the message to pt from Jinny Blossom, NP after speaking to Dr Leonie Man, as per message below.    I discussed with her vascular neurologist- Dr. Leonie Man.  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  As long as she is not on a blood thinner longer than 4 weeks risk is probably minimal.  Please make patient aware.  I will also CC Dr. Charisse March nurse for surgical clearance.    Pt verbalized understanding.  Any questions, then Dr. Berline Lopes or nurse can get back with [place].  Appreciated call back.

## 2021-03-18 NOTE — Telephone Encounter (Addendum)
Spoke with the gynecologic oncology center.  They recommend heparin before surgery and the staff is going to clarify with the nurse practitioner which low-dose blood thinner they will likely recommend for around 2 weeks after the surgery.  She is also going to seek clarification on what the provider's request is for patient's symptoms as their note had mentioned evaluate for possible TIAs however the pt has said nothing has changed from the symptoms she has discussed with our office prior.   Will update as soon as I have heard back.

## 2021-03-18 NOTE — Telephone Encounter (Signed)
I discussed with her vascular neurologist- Dr. Leonie Man.  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  As long as she is not on a blood thinner longer than 4 weeks risk is probably minimal.  Please make patient aware.  I will also CC Dr. Charisse March nurse for surgical clearance.

## 2021-03-19 ENCOUNTER — Telehealth: Payer: Self-pay | Admitting: Oncology

## 2021-03-19 NOTE — Telephone Encounter (Signed)
[name] called back and was advised that she will be taking Lovenox 40 mg daily for 4 weeks post op and asked if she would like this sent to her pharmacy to pick up before surgery.    [name] said she is very concerned about this due to her past brain bleed.  She thought Dr. Berline Lopes had said to take it for 2 weeks. She feels strongly about taking the least amount of blood thinners, if at all, for the shortest period of time.  She is concerned because her neurologist has retired and is not sure if the neurologist who cleared her for blood thinners knows her history.     Advised her that I will check on this and call her back.  Also reviewed upcoming appointments for liver ultrasound on 03/22/21 and follow up with Dr. Coral Spikes on 03/22/21.  She verbalized understanding and agreement.

## 2021-03-19 NOTE — Telephone Encounter (Signed)
Left a message regarding post op Lovenox.  Requested a return call.

## 2021-03-22 ENCOUNTER — Other Ambulatory Visit: Payer: Self-pay

## 2021-03-22 ENCOUNTER — Ambulatory Visit (HOSPITAL_COMMUNITY)
Admission: RE | Admit: 2021-03-22 | Discharge: 2021-03-22 | Disposition: A | Discharge status: Home / Self Care | Payer: PPO | Source: Ambulatory Visit | Attending: Gynecologic Oncology | Admitting: Gynecologic Oncology

## 2021-03-22 DIAGNOSIS — R7989 Other specified abnormal findings of blood chemistry: Secondary | ICD-10-CM | POA: Diagnosis not present

## 2021-03-23 DIAGNOSIS — C55 Malignant neoplasm of uterus, part unspecified: Secondary | ICD-10-CM | POA: Diagnosis not present

## 2021-03-23 DIAGNOSIS — R748 Abnormal levels of other serum enzymes: Secondary | ICD-10-CM | POA: Diagnosis not present

## 2021-03-23 NOTE — Telephone Encounter (Signed)
Called and spoke to [place] (spouse).  Advsied that the [place] upper results are normal and to keep her appointment with Dr. Laurann Montana today.  He verbalized understanding and said the appoinmtment is at 11:30.  Also advised that Dr. Charisse March recommendation for Lovenox is for 4 weeks post op given her history and neurology has signed off on this.  He verbalized understanding and will relay the message to [place].

## 2021-03-24 ENCOUNTER — Telehealth: Payer: Self-pay

## 2021-03-24 NOTE — Telephone Encounter (Signed)
Telephone call to check on pre-operative status.  Patient compliant with pre-operative instructions.  Reinforced Nothing to eat after midnight, clear liquids until 0830.  Reviewed instructions for ensure pre-surgery drinks. Patient understands the blood thinner recommendations for after surgery, she does not want to take lovenox for 4 weeks but is agreeable to 2 weeks. Instructed patient that Dr. Berline Lopes will be notified.  No questions or concerns voiced.  Instructed to call for any needs.

## 2021-03-25 ENCOUNTER — Encounter: Payer: Self-pay | Admitting: Oncology

## 2021-03-25 ENCOUNTER — Encounter (HOSPITAL_COMMUNITY): Payer: Self-pay | Admitting: Gynecologic Oncology

## 2021-03-25 ENCOUNTER — Ambulatory Visit (HOSPITAL_COMMUNITY)
Admission: RE | Admit: 2021-03-25 | Discharge: 2021-03-25 | Disposition: A | Discharge status: Home / Self Care | Payer: PPO | Attending: Gynecologic Oncology | Admitting: Gynecologic Oncology

## 2021-03-25 ENCOUNTER — Ambulatory Visit (HOSPITAL_COMMUNITY): Payer: PPO | Admitting: Anesthesiology

## 2021-03-25 ENCOUNTER — Ambulatory Visit (HOSPITAL_COMMUNITY): Payer: PPO | Admitting: Physician Assistant

## 2021-03-25 ENCOUNTER — Other Ambulatory Visit: Payer: Self-pay | Admitting: Gynecologic Oncology

## 2021-03-25 ENCOUNTER — Encounter (HOSPITAL_COMMUNITY)
Admission: RE | Disposition: A | Discharge status: Home / Self Care | Payer: Self-pay | Source: Home / Self Care | Attending: Gynecologic Oncology

## 2021-03-25 DIAGNOSIS — M8580 Other specified disorders of bone density and structure, unspecified site: Secondary | ICD-10-CM | POA: Diagnosis not present

## 2021-03-25 DIAGNOSIS — G40909 Epilepsy, unspecified, not intractable, without status epilepticus: Secondary | ICD-10-CM | POA: Diagnosis not present

## 2021-03-25 DIAGNOSIS — C541 Malignant neoplasm of endometrium: Secondary | ICD-10-CM

## 2021-03-25 DIAGNOSIS — K645 Perianal venous thrombosis: Secondary | ICD-10-CM

## 2021-03-25 DIAGNOSIS — Z86718 Personal history of other venous thrombosis and embolism: Secondary | ICD-10-CM

## 2021-03-25 DIAGNOSIS — Z87891 Personal history of nicotine dependence: Secondary | ICD-10-CM | POA: Insufficient documentation

## 2021-03-25 DIAGNOSIS — K219 Gastro-esophageal reflux disease without esophagitis: Secondary | ICD-10-CM | POA: Insufficient documentation

## 2021-03-25 DIAGNOSIS — E559 Vitamin D deficiency, unspecified: Secondary | ICD-10-CM | POA: Diagnosis not present

## 2021-03-25 HISTORY — PX: VULVA /PERINEUM BIOPSY: SHX319

## 2021-03-25 HISTORY — PX: ROBOTIC ASSISTED TOTAL HYSTERECTOMY WITH BILATERAL SALPINGO OOPHERECTOMY: SHX6086

## 2021-03-25 HISTORY — PX: SENTINEL NODE BIOPSY: SHX6608

## 2021-03-25 LAB — PROTIME-INR
INR: 1 (ref 0.8–1.2)
Prothrombin Time: 13.1 seconds (ref 11.4–15.2)

## 2021-03-25 LAB — APTT: aPTT: 28 seconds (ref 24–36)

## 2021-03-25 LAB — TYPE AND SCREEN
ABO/RH(D): A POS
Antibody Screen: NEGATIVE

## 2021-03-25 LAB — ABO/RH: ABO/RH(D): A POS

## 2021-03-25 SURGERY — HYSTERECTOMY, TOTAL, ROBOT-ASSISTED, LAPAROSCOPIC, WITH BILATERAL SALPINGO-OOPHORECTOMY
Anesthesia: General

## 2021-03-25 MED ORDER — ENSURE PRE-SURGERY PO LIQD
592.0000 mL | Freq: Once | ORAL | Status: DC
  Filled 2021-03-25: qty 592

## 2021-03-25 MED ORDER — CHLORHEXIDINE GLUCONATE 0.12 % MT SOLN
15.0000 mL | Freq: Once | OROMUCOSAL | Status: AC
  Administered 2021-03-25: 15 mL via OROMUCOSAL

## 2021-03-25 MED ORDER — EPHEDRINE 5 MG/ML INJ
INTRAVENOUS | Status: AC
  Filled 2021-03-25: qty 5

## 2021-03-25 MED ORDER — DEXAMETHASONE SODIUM PHOSPHATE 4 MG/ML IJ SOLN: 4.0000 mg | INTRAMUSCULAR | Status: DC

## 2021-03-25 MED ORDER — HYDROMORPHONE HCL 1 MG/ML IJ SOLN
0.2500 mg | INTRAMUSCULAR | Status: DC | PRN
  Administered 2021-03-25 (×4): 0.5 mg via INTRAVENOUS

## 2021-03-25 MED ORDER — HYDROMORPHONE HCL 1 MG/ML IJ SOLN
INTRAMUSCULAR | Status: AC
  Filled 2021-03-25: qty 1

## 2021-03-25 MED ORDER — ONDANSETRON HCL 4 MG/2ML IJ SOLN: 4.0000 mg | Freq: Once | INTRAMUSCULAR | Status: DC | PRN

## 2021-03-25 MED ORDER — LIDOCAINE HCL (CARDIAC) PF 100 MG/5ML IV SOSY
PREFILLED_SYRINGE | INTRAVENOUS | Status: DC | PRN
  Administered 2021-03-25: 100 mg via INTRAVENOUS

## 2021-03-25 MED ORDER — STERILE WATER FOR INJECTION IJ SOLN
INTRAMUSCULAR | Status: DC | PRN
  Administered 2021-03-25: 10 mL

## 2021-03-25 MED ORDER — BUPIVACAINE HCL 0.25 % IJ SOLN
INTRAMUSCULAR | Status: AC
  Filled 2021-03-25: qty 1

## 2021-03-25 MED ORDER — FENTANYL CITRATE (PF) 100 MCG/2ML IJ SOLN
INTRAMUSCULAR | Status: DC | PRN
  Administered 2021-03-25 (×4): 50 ug via INTRAVENOUS

## 2021-03-25 MED ORDER — OXYCODONE HCL 5 MG PO TABS
ORAL_TABLET | ORAL | Status: AC
  Filled 2021-03-25: qty 1

## 2021-03-25 MED ORDER — SUGAMMADEX SODIUM 200 MG/2ML IV SOLN
INTRAVENOUS | Status: DC | PRN
Start: 2021-03-25 — End: 2021-03-25
  Administered 2021-03-25: 200 mg via INTRAVENOUS

## 2021-03-25 MED ORDER — ONDANSETRON HCL 4 MG/2ML IJ SOLN
INTRAMUSCULAR | Status: AC
  Filled 2021-03-25: qty 2

## 2021-03-25 MED ORDER — ROCURONIUM BROMIDE 100 MG/10ML IV SOLN
INTRAVENOUS | Status: DC | PRN
  Administered 2021-03-25: 20 mg via INTRAVENOUS
  Administered 2021-03-25: 60 mg via INTRAVENOUS

## 2021-03-25 MED ORDER — PROPOFOL 10 MG/ML IV BOLUS
INTRAVENOUS | Status: DC | PRN
  Administered 2021-03-25: 120 mg via INTRAVENOUS

## 2021-03-25 MED ORDER — HEPARIN SODIUM (PORCINE) 5000 UNIT/ML IJ SOLN
5000.0000 [IU] | INTRAMUSCULAR | Status: AC
  Administered 2021-03-25: 5000 [IU] via SUBCUTANEOUS
  Filled 2021-03-25: qty 1

## 2021-03-25 MED ORDER — STERILE WATER FOR IRRIGATION IR SOLN
Status: DC | PRN
  Administered 2021-03-25: 1000 mL

## 2021-03-25 MED ORDER — FENTANYL CITRATE (PF) 100 MCG/2ML IJ SOLN
INTRAMUSCULAR | Status: AC
  Filled 2021-03-25: qty 2

## 2021-03-25 MED ORDER — ENOXAPARIN SODIUM 40 MG/0.4ML IJ SOSY: 40.0000 mg | PREFILLED_SYRINGE | INTRAMUSCULAR | 0 refills | Status: DC

## 2021-03-25 MED ORDER — OXYCODONE HCL 5 MG/5ML PO SOLN: 5.0000 mg | Freq: Once | ORAL | Status: AC | PRN

## 2021-03-25 MED ORDER — ORAL CARE MOUTH RINSE: 15.0000 mL | Freq: Once | OROMUCOSAL | Status: AC

## 2021-03-25 MED ORDER — LACTATED RINGERS IR SOLN
Status: DC | PRN
  Administered 2021-03-25: 1000 mL

## 2021-03-25 MED ORDER — EPHEDRINE SULFATE-NACL 50-0.9 MG/10ML-% IV SOSY
PREFILLED_SYRINGE | INTRAVENOUS | Status: DC | PRN
  Administered 2021-03-25: 10 mg via INTRAVENOUS

## 2021-03-25 MED ORDER — BUPIVACAINE HCL 0.25 % IJ SOLN
INTRAMUSCULAR | Status: DC | PRN
  Administered 2021-03-25: 35 mL

## 2021-03-25 MED ORDER — ENSURE PRE-SURGERY PO LIQD
296.0000 mL | Freq: Once | ORAL | Status: DC
  Filled 2021-03-25: qty 296

## 2021-03-25 MED ORDER — PHENYLEPHRINE 40 MCG/ML (10ML) SYRINGE FOR IV PUSH (FOR BLOOD PRESSURE SUPPORT)
PREFILLED_SYRINGE | INTRAVENOUS | Status: DC | PRN
  Administered 2021-03-25: 80 ug via INTRAVENOUS

## 2021-03-25 MED ORDER — SILVER NITRATE-POT NITRATE 75-25 % EX MISC
CUTANEOUS | Status: AC
  Filled 2021-03-25: qty 10

## 2021-03-25 MED ORDER — ROCURONIUM BROMIDE 10 MG/ML (PF) SYRINGE
PREFILLED_SYRINGE | INTRAVENOUS | Status: AC
  Filled 2021-03-25: qty 10

## 2021-03-25 MED ORDER — PHENYLEPHRINE 40 MCG/ML (10ML) SYRINGE FOR IV PUSH (FOR BLOOD PRESSURE SUPPORT)
PREFILLED_SYRINGE | INTRAVENOUS | Status: AC
  Filled 2021-03-25: qty 10

## 2021-03-25 MED ORDER — PROPOFOL 10 MG/ML IV BOLUS
INTRAVENOUS | Status: AC
  Filled 2021-03-25: qty 20

## 2021-03-25 MED ORDER — CEFAZOLIN SODIUM-DEXTROSE 2-4 GM/100ML-% IV SOLN
2.0000 g | INTRAVENOUS | Status: AC
  Administered 2021-03-25: 2 g via INTRAVENOUS
  Filled 2021-03-25: qty 100

## 2021-03-25 MED ORDER — ENOXAPARIN (LOVENOX) PATIENT EDUCATION KIT
PACK | Freq: Once | Status: DC
  Filled 2021-03-25: qty 1

## 2021-03-25 MED ORDER — LIDOCAINE HCL (PF) 2 % IJ SOLN
INTRAMUSCULAR | Status: AC
  Filled 2021-03-25: qty 5

## 2021-03-25 MED ORDER — LACTATED RINGERS IV SOLN: INTRAVENOUS | Status: DC

## 2021-03-25 MED ORDER — ONDANSETRON HCL 4 MG/2ML IJ SOLN
INTRAMUSCULAR | Status: DC | PRN
  Administered 2021-03-25: 4 mg via INTRAVENOUS

## 2021-03-25 MED ORDER — STERILE WATER FOR INJECTION IJ SOLN
INTRAMUSCULAR | Status: AC
  Filled 2021-03-25: qty 10

## 2021-03-25 MED ORDER — SILVER NITRATE-POT NITRATE 75-25 % EX MISC
CUTANEOUS | Status: DC | PRN
  Administered 2021-03-25: 1

## 2021-03-25 MED ORDER — OXYCODONE HCL 5 MG PO TABS
5.0000 mg | ORAL_TABLET | Freq: Once | ORAL | Status: AC | PRN
  Administered 2021-03-25: 5 mg via ORAL

## 2021-03-25 MED ORDER — ACETAMINOPHEN 500 MG PO TABS
1000.0000 mg | ORAL_TABLET | ORAL | Status: DC
  Filled 2021-03-25: qty 2

## 2021-03-25 SURGICAL SUPPLY — 75 items
ADH SKN CLS APL DERMABOND .7 (GAUZE/BANDAGES/DRESSINGS) ×1
AGENT HMST KT MTR STRL THRMB (HEMOSTASIS)
APL ESCP 34 STRL LF DISP (HEMOSTASIS)
APPLICATOR SURGIFLO ENDO (HEMOSTASIS) IMPLANT
BAG COUNTER SPONGE SURGICOUNT (BAG) IMPLANT
BAG LAPAROSCOPIC 12 15 PORT 16 (BASKET) IMPLANT
BAG RETRIEVAL 12/15 (BASKET)
BAG SPEC RTRVL LRG 6X4 10 (ENDOMECHANICALS)
BAG SPNG CNTER NS LX DISP (BAG)
BLADE SURG SZ10 CARB STEEL (BLADE) IMPLANT
COVER BACK TABLE 60X90IN (DRAPES) ×2 IMPLANT
COVER TIP SHEARS 8 DVNC (MISCELLANEOUS) ×1 IMPLANT
COVER TIP SHEARS 8MM DA VINCI (MISCELLANEOUS) ×2
DECANTER SPIKE VIAL GLASS SM (MISCELLANEOUS) ×2 IMPLANT
DERMABOND ADVANCED (GAUZE/BANDAGES/DRESSINGS) ×1
DERMABOND ADVANCED .7 DNX12 (GAUZE/BANDAGES/DRESSINGS) ×1 IMPLANT
DRAPE ARM DVNC X/XI (DISPOSABLE) ×4 IMPLANT
DRAPE COLUMN DVNC XI (DISPOSABLE) ×1 IMPLANT
DRAPE DA VINCI XI ARM (DISPOSABLE) ×8
DRAPE DA VINCI XI COLUMN (DISPOSABLE) ×2
DRAPE SHEET LG 3/4 BI-LAMINATE (DRAPES) ×2 IMPLANT
DRAPE SURG IRRIG POUCH 19X23 (DRAPES) ×2 IMPLANT
DRSG OPSITE POSTOP 4X6 (GAUZE/BANDAGES/DRESSINGS) IMPLANT
DRSG OPSITE POSTOP 4X8 (GAUZE/BANDAGES/DRESSINGS) IMPLANT
DRSG TELFA 3X8 NADH (GAUZE/BANDAGES/DRESSINGS) ×2 IMPLANT
ELECT PENCIL ROCKER SW 15FT (MISCELLANEOUS) IMPLANT
ELECT REM PT RETURN 15FT ADLT (MISCELLANEOUS) ×2 IMPLANT
GAUZE 4X4 16PLY ~~LOC~~+RFID DBL (SPONGE) ×2 IMPLANT
GOWN STRL REUS W/ TWL LRG LVL3 (GOWN DISPOSABLE) ×4 IMPLANT
GOWN STRL REUS W/ TWL XL LVL3 (GOWN DISPOSABLE) IMPLANT
GOWN STRL REUS W/TWL LRG LVL3 (GOWN DISPOSABLE) ×11 IMPLANT
GOWN STRL REUS W/TWL XL LVL3 (GOWN DISPOSABLE) ×2
HOLDER FOLEY CATH W/STRAP (MISCELLANEOUS) ×1 IMPLANT
IRRIG SUCT STRYKERFLOW 2 WTIP (MISCELLANEOUS) ×2
IRRIGATION SUCT STRKRFLW 2 WTP (MISCELLANEOUS) ×1 IMPLANT
KIT PROCEDURE DA VINCI SI (MISCELLANEOUS) ×2
KIT PROCEDURE DVNC SI (MISCELLANEOUS) IMPLANT
KIT TURNOVER KIT A (KITS) IMPLANT
MANIPULATOR ADVINCU DEL 3.0 PL (MISCELLANEOUS) ×1 IMPLANT
MANIPULATOR UTERINE 4.5 ZUMI (MISCELLANEOUS) ×1 IMPLANT
NDL HYPO 21X1.5 SAFETY (NEEDLE) ×1 IMPLANT
NDL SPNL 18GX3.5 QUINCKE PK (NEEDLE) IMPLANT
NEEDLE HYPO 21X1.5 SAFETY (NEEDLE) ×2 IMPLANT
NEEDLE SPNL 18GX3.5 QUINCKE PK (NEEDLE) ×2 IMPLANT
OBTURATOR OPTICAL STANDARD 8MM (TROCAR) ×2
OBTURATOR OPTICAL STND 8 DVNC (TROCAR) ×1
OBTURATOR OPTICALSTD 8 DVNC (TROCAR) ×1 IMPLANT
PACK ROBOT GYN CUSTOM WL (TRAY / TRAY PROCEDURE) ×2 IMPLANT
PAD DRESSING TELFA 3X8 NADH (GAUZE/BANDAGES/DRESSINGS) IMPLANT
PAD POSITIONING PINK XL (MISCELLANEOUS) ×2 IMPLANT
PORT ACCESS TROCAR AIRSEAL 12 (TROCAR) ×1 IMPLANT
PORT ACCESS TROCAR AIRSEAL 5M (TROCAR) ×1
POUCH SPECIMEN RETRIEVAL 10MM (ENDOMECHANICALS) IMPLANT
SCRUB EXIDINE 4% CHG 4OZ (MISCELLANEOUS) ×2 IMPLANT
SEAL CANN UNIV 5-8 DVNC XI (MISCELLANEOUS) ×4 IMPLANT
SEAL XI 5MM-8MM UNIVERSAL (MISCELLANEOUS) ×8
SET TRI-LUMEN FLTR TB AIRSEAL (TUBING) ×2 IMPLANT
SPONGE T-LAP 18X18 ~~LOC~~+RFID (SPONGE) IMPLANT
SURGIFLO W/THROMBIN 8M KIT (HEMOSTASIS) IMPLANT
SUT MNCRL AB 4-0 PS2 18 (SUTURE) IMPLANT
SUT PDS AB 1 TP1 96 (SUTURE) IMPLANT
SUT VIC AB 0 CT1 27 (SUTURE)
SUT VIC AB 0 CT1 27XBRD ANTBC (SUTURE) IMPLANT
SUT VIC AB 2-0 CT1 27 (SUTURE)
SUT VIC AB 2-0 CT1 TAPERPNT 27 (SUTURE) IMPLANT
SUT VIC AB 4-0 PS2 18 (SUTURE) ×4 IMPLANT
SYR 10ML LL (SYRINGE) ×1 IMPLANT
TOWEL OR NON WOVEN STRL DISP B (DISPOSABLE) ×1 IMPLANT
TRAP SPECIMEN MUCUS 40CC (MISCELLANEOUS) IMPLANT
TRAY FOL W/BAG SLVR 16FR STRL (SET/KITS/TRAYS/PACK) IMPLANT
TRAY FOLEY W/BAG SLVR 16FR LF (SET/KITS/TRAYS/PACK) ×2
TROCAR XCEL NON-BLD 5MMX100MML (ENDOMECHANICALS) IMPLANT
UNDERPAD 30X36 HEAVY ABSORB (UNDERPADS AND DIAPERS) ×4 IMPLANT
WATER STERILE IRR 1000ML POUR (IV SOLUTION) ×2 IMPLANT
YANKAUER SUCT BULB TIP 10FT TU (MISCELLANEOUS) IMPLANT

## 2021-03-25 NOTE — Op Note (Addendum)
OPERATIVE NOTE  Pre-operative Diagnosis: endometrial cancer grade 1  Post-operative Diagnosis: same  Operation: Robotic-assisted laparoscopic total hysterectomy with bilateral salpingo-oophorectomy, SLN biopsy   Surgeon: Jeral Pinch MD  Assistant Surgeon: Lahoma Crocker MD (an MD assistant was necessary for tissue manipulation, management of robotic instrumentation, retraction and positioning due to the complexity of the case and hospital policies).   Anesthesia: GET  Urine Output: 500cc  Operative Findings: On EUA, small mobile uterus.  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  On intra-abdominal entry, normal upper abdominal survey.  Omentum adherent to the anterior abdominal wall along the midline just inferior to the umbilicus, also along the right abdominal sidewall.  Otherwise omentum normal-appearing.  Normal-appearing small and large bowel.  Uterus 6 cm and normal appearing.  Bilateral adnexa atrophic and normal-appearing.  No obvious adenopathy.  Mapping successful on the right to a presacral node, on the left to a deep obturator and external iliac sentinel lymph node.  Minimal adhesions of the bladder to the cervix.  No intra-abdominal or pelvic evidence of disease.  Estimated Blood Loss:  less than 100 mL      Total IV Fluids: see I&O flowsheet         Specimens: uterus, cervix, bilateral tubes and ovaries, right presacral and left obturator and external iliac sentinel lymph node         Complications:  None apparent; patient tolerated the procedure well.         Disposition: PACU - hemodynamically stable.  Procedure Details  The patient was seen in the Holding Room. The risks, benefits, complications, treatment options, and expected outcomes were discussed with the patient.  The patient concurred with the proposed plan, giving informed consent.  The site of surgery  properly noted/marked. The patient was identified as [name] and the procedure verified as a Robotic-assisted hysterectomy with bilateral salpingo oophorectomy with SLN biopsy.   After induction of anesthesia, the patient was draped and prepped in the usual sterile manner. Patient was placed in supine position after anesthesia and draped and prepped in the usual sterile manner as follows: Her arms were tucked to her side with all appropriate precautions.  The shoulders were stabilized with padded shoulder blocks applied to the acromium processes.  The patient was placed in the semi-lithotomy position in [place].  The perineum and vagina were prepped with CholoraPrep. The patient was draped after the CholoraPrep had been allowed to dry for 3 minutes.  A Time Out was held and the above information confirmed.  The urethra was prepped with Betadine. Foley catheter was placed.  A sterile speculum was placed in the vagina.  The cervix was grasped with a single-tooth tenaculum. 2mg  total of ICG was injected into the cervical stroma at 2 and 9 o'clock with 1cc injected at a 1cm and 37mm depth (concentration 0.5mg /ml) in all locations. The cervix was dilated with Kennon Rounds dilators.  The ZUMI uterine manipulator with a medium colpotomizer ring was placed without difficulty.  A pneum occluder balloon was placed over the manipulator.  OG tube placement was confirmed and to suction.   Next, a 10 mm skin incision was made 1 cm below the subcostal margin in the midclavicular line.  The 5 mm Optiview port and scope was used for direct entry.  Opening pressure was under 10 mm CO2.  The abdomen was insufflated and the findings were noted as above.   At  this point and all points during the procedure, the patient's intra-abdominal pressure did not exceed 15 mmHg. Next, an 8 mm skin incision was made superior the umbilicus and a right and left port were placed about 8 cm lateral to the robot port on the right and left  side.  A fourth arm was placed on the right.  The 5 mm assist trocar was exchanged for a 10-12 mm port. All ports were placed under direct visualization.  The patient was placed in steep Trendelenburg.  Adhesions of the omentum to the anterior abdominal wall were taken down sharply and with short bursts of monopolar electrocautery using the scissors.  The robot was docked in the normal manner.  The right and left peritoneum were opened parallel to the IP ligament to open the retroperitoneal spaces bilaterally. The round ligaments were transected. The SLN mapping was performed in bilateral pelvic basins. After identifying the ureters, the para rectal and paravesical spaces were opened up entirely with careful dissection below the level of the ureters bilaterally and to the depth of the uterine artery origin in order to skeletonize the uterine "web" and ensure visualization of all parametrial channels. The para-aortic basins were carefully exposed and evaluated for isolated para-aortic SLN's. Lymphatic channels were identified travelling to the following visualized sentinel lymph node's: Right presacral, left deep obturator, left external iliac. These SLN's were separated from their surrounding lymphatic tissue, removed and sent for permanent pathology.  The hysterectomy was started.  The ureter was again noted to be on the medial leaf of the broad ligament.  The peritoneum above the ureter was incised and stretched and the infundibulopelvic ligament was skeletonized, cauterized and cut.  The posterior peritoneum was taken down to the level of the KOH ring.  The anterior peritoneum was also taken down.  The bladder flap was created to the level of the KOH ring.  The uterine artery on the right side was skeletonized, cauterized and cut in the normal manner.  A similar procedure was performed on the left.  The colpotomy was made and the uterus, cervix, bilateral ovaries and tubes were amputated and delivered through  the vagina.  Pedicles were inspected and excellent hemostasis was achieved.    The colpotomy at the vaginal cuff was closed with Vicryl on a CT1 needle in a running manner.  Irrigation was used and excellent hemostasis was achieved.  At this point in the procedure was completed.  Robotic instruments were removed under direct visulaization.  The robot was undocked. The fascia at the 10-12 mm port was closed with 0 Vicryl on a UR-5 needle.  The subcuticular tissue was closed with 4-0 Vicryl and the skin was closed with 4-0 Monocryl in a subcuticular manner.  Dermabond was applied.    The vagina was swabbed with minimal bleeding noted.  Vulvar biopsies were taken bilaterally at 3 and 9:00.  Hemostasis was achieved with silver nitrate application.  Foley catheter was removed.  All sponge, lap and needle counts were correct x  3.   The patient was transferred to the recovery room in stable condition.  Jeral Pinch, MD

## 2021-03-25 NOTE — Interval H&P Note (Signed)
History and Physical Interval Note:  03/25/2021 10:33 AM  [name]  has presented today for surgery, with the diagnosis of ENDOMETRIAL CANCER.  The various methods of treatment have been discussed with the patient and family. After consideration of risks, benefits and other options for treatment, the patient has consented to  Procedure(s): XI ROBOTIC ASSISTED TOTAL HYSTERECTOMY WITH BILATERAL SALPINGO OOPHORECTOMY, POSSIBLE LAPAROTOMY (N/A) SENTINEL NODE BIOPSY (N/A) POSSIBLE LYMPH NODE DISSECTION (N/A) VULVAR BIOPSY (N/A) as a surgical intervention.  The patient's history has been reviewed, patient examined, no change in status, stable for surgery.  I have reviewed the patient's chart and labs.  Questions were answered to the patient's satisfaction.     Lafonda Mosses

## 2021-03-25 NOTE — Anesthesia Postprocedure Evaluation (Signed)
Anesthesia Post Note  Patient: SYESHA THAW  Procedure(s) Performed: XI ROBOTIC ASSISTED TOTAL HYSTERECTOMY WITH BILATERAL SALPINGO OOPHORECTOMY SENTINEL NODE BIOPSY VULVAR BIOPSY     Patient location during evaluation: PACU Anesthesia Type: General Level of consciousness: awake and alert Pain management: pain level controlled Vital Signs Assessment: post-procedure vital signs reviewed and stable Respiratory status: spontaneous breathing, nonlabored ventilation, respiratory function stable and patient connected to nasal cannula oxygen Cardiovascular status: blood pressure returned to baseline and stable Postop Assessment: no apparent nausea or vomiting Anesthetic complications: no   No notable events documented.  Last Vitals:  Vitals:   03/25/21 1500 03/25/21 1515  BP: (!) 149/74 (!) 143/70  Pulse: 79 83  Resp: 13 14  Temp: (!) 36.4 C   SpO2: 95% 97%    Last Pain:  Vitals:   03/25/21 1500  TempSrc:   PainSc: 5                  Jalecia Leon S

## 2021-03-25 NOTE — Discharge Instructions (Signed)

## 2021-03-25 NOTE — Anesthesia Procedure Notes (Signed)
Procedure Name: Intubation Date/Time: 03/25/2021 11:33 AM Performed by: Niel Hummer, CRNA Pre-anesthesia Checklist: Patient identified, Emergency Drugs available, Suction available and Patient being monitored Patient Re-evaluated:Patient Re-evaluated prior to induction Oxygen Delivery Method: Circle system utilized Induction Type: IV induction Ventilation: Mask ventilation without difficulty Laryngoscope Size: Mac and 3 Grade View: Grade I Tube type: Oral Tube size: 7.0 mm Number of attempts: 1 Airway Equipment and Method: Stylet Placement Confirmation: ETT inserted through vocal cords under direct vision, positive ETCO2 and breath sounds checked- equal and bilateral Secured at: 23 cm Tube secured with: Tape Dental Injury: Teeth and Oropharynx as per pre-operative assessment

## 2021-03-25 NOTE — Progress Notes (Signed)
Met with Chloey and her husband Gershon Mussel in the PACU.  Provided them with the Lovenox Patient Instruction handout, link to the patent injection video and the [facility] office phone number.  Went over the injection instructions and injection sites with Tom and advised them to pick up the prescription at the pharmacy and to start tomorrow afternoon.  Advised to give the injection at the same time everyday and to alternate injection sites. Tom verbalized understanding and agreement and knows to call with any questions.

## 2021-03-25 NOTE — Anesthesia Preprocedure Evaluation (Signed)
Anesthesia Evaluation  Patient identified by MRN, date of birth, ID band Patient awake    Reviewed: Allergy & Precautions, H&P , NPO status , Patient's Chart, lab work & pertinent test results  Airway Mallampati: II  TM Distance: >3 FB Neck ROM: Full    Dental no notable dental hx.    Pulmonary neg pulmonary ROS, former smoker,    Pulmonary exam normal breath sounds clear to auscultation       Cardiovascular negative cardio ROS Normal cardiovascular exam Rhythm:Regular Rate:Normal     Neuro/Psych Seizures -, Well Controlled,  CVA negative psych ROS   GI/Hepatic Neg liver ROS, GERD  ,  Endo/Other  negative endocrine ROS  Renal/GU negative Renal ROS  negative genitourinary   Musculoskeletal negative musculoskeletal ROS (+)   Abdominal   Peds negative pediatric ROS (+)  Hematology negative hematology ROS (+)   Anesthesia Other Findings   Reproductive/Obstetrics negative OB ROS                             Anesthesia Physical Anesthesia Plan  ASA: 3  Anesthesia Plan: General   Post-op Pain Management: Dilaudid IV   Induction: Intravenous  PONV Risk Score and Plan: 3 and Ondansetron, Dexamethasone and Treatment may vary due to age or medical condition  Airway Management Planned: Oral ETT  Additional Equipment:   Intra-op Plan:   Post-operative Plan: Extubation in OR  Informed Consent: I have reviewed the patients History and Physical, chart, labs and discussed the procedure including the risks, benefits and alternatives for the proposed anesthesia with the patient or authorized representative who has indicated his/her understanding and acceptance.     Dental advisory given  Plan Discussed with: CRNA and Surgeon  Anesthesia Plan Comments:         Anesthesia Quick Evaluation

## 2021-03-25 NOTE — Transfer of Care (Signed)
Immediate Anesthesia Transfer of Care Note  Patient: [name]  Procedure(s) Performed: XI ROBOTIC ASSISTED TOTAL HYSTERECTOMY WITH BILATERAL SALPINGO OOPHORECTOMY SENTINEL NODE BIOPSY VULVAR BIOPSY  Patient Location: PACU  Anesthesia Type:General  Level of Consciousness: awake, alert  and oriented  Airway & Oxygen Therapy: Patient Spontanous Breathing and Patient connected to face mask oxygen  Post-op Assessment: Report given to RN and Post -op Vital signs reviewed and stable  Post vital signs: Reviewed and stable  Last Vitals:  Vitals Value Taken Time  BP 156/76 03/25/21 1406  Temp    Pulse 82 03/25/21 1408  Resp 19 03/25/21 1408  SpO2 100 % 03/25/21 1408  Vitals shown include unvalidated device data.  Last Pain:  Vitals:   03/25/21 0946  TempSrc:   PainSc: 0-No pain         Complications: No notable events documented.

## 2021-03-25 NOTE — Progress Notes (Signed)
Post-operative lovenox prescription sent to patient's pharmacy. The patient told our RN yesterday that she is only going to take 2 weeks worth. Advised of Dr. Charisse March recommendation for 4 weeks. The prescription is sent in for the recommended duration.

## 2021-03-26 ENCOUNTER — Telehealth: Payer: Self-pay

## 2021-03-26 ENCOUNTER — Encounter (HOSPITAL_COMMUNITY): Payer: Self-pay | Admitting: Gynecologic Oncology

## 2021-03-26 NOTE — Telephone Encounter (Signed)
Spoke with Ms. Moors and her husband Gershon Mussel this afternoon she states she is doing well. She reports a sore scratchy throat today. Advised patient to gargle with warm salt water. She is eating ok, she has had lighter diet today and is drinking plenty of water. She reports pain with urination. When she got home last night she had difficulty urinating, it hurt, she had burning and her urine stream was slow. She reports improvement with this today but still endorses burning. Instructed to monitor symptoms and use a squirt bottle on her vulva as she urinates to dilute urine. Burning may be from vulvar biopsies. She has not had a BM yet but is passing gas. Instructed to continue using miralax and colace as needed to prevent constipation. She denies fever or chills. Incisions are dry and intact. She rates her pain as 6/10. She has been taking ibuprofen and tramadol and feels this is adequate for her pain. Pain increases with ambulation but she is comfortable when resting.   Patient concerned with weight gain post-op. She reports she was losing weight prior to surgery and weighed 159lb and when she weighed at home today she was 166lb. Advised patient this could be from the fluids in surgery and to monitor her weight.   Husband and patient report lovenox injection went well, no questions or concerns with this.  Instructed to call office with any fever, chills, purulent drainage, uncontrolled pain or any other questions or concerns. Patient verbalizes understanding.   Pt aware of post op appointments as well as the office number [phone] and after hours number [phone] to call if she has any questions or concerns

## 2021-04-01 ENCOUNTER — Other Ambulatory Visit: Payer: Self-pay

## 2021-04-01 ENCOUNTER — Encounter: Payer: Self-pay | Admitting: Gynecologic Oncology

## 2021-04-01 ENCOUNTER — Ambulatory Visit (HOSPITAL_BASED_OUTPATIENT_CLINIC_OR_DEPARTMENT_OTHER): Payer: PPO | Admitting: Gynecologic Oncology

## 2021-04-01 DIAGNOSIS — Z90722 Acquired absence of ovaries, bilateral: Secondary | ICD-10-CM

## 2021-04-01 DIAGNOSIS — K5909 Other constipation: Secondary | ICD-10-CM

## 2021-04-01 DIAGNOSIS — C541 Malignant neoplasm of endometrium: Secondary | ICD-10-CM

## 2021-04-01 DIAGNOSIS — Z90710 Acquired absence of both cervix and uterus: Secondary | ICD-10-CM

## 2021-04-01 MED ORDER — TRAMADOL HCL 50 MG PO TABS: 50.0000 mg | ORAL_TABLET | Freq: Four times a day (QID) | ORAL | 0 refills | Status: DC | PRN

## 2021-04-01 NOTE — Progress Notes (Signed)
Gynecologic Oncology Telehealth Consult Note: Gyn-Onc  I connected with [name] on 04/01/21 at  4:00 PM EST by telephone and verified that I am speaking with the correct person using two identifiers.  I discussed the limitations, risks, security and privacy concerns of performing an evaluation and management service by telemedicine and the availability of in-person appointments. I also discussed with the patient that there may be a patient responsible charge related to this service. The patient expressed understanding and agreed to proceed.  Other persons participating in the visit and their role in the encounter: husband.  Patient's location: home Provider's location: [facility]  Reason for Visit: follow-up after surgery, review of pathology  Treatment History: 12/8: TRH/BSO, bilateral SLN biopsy, vulvar biopsy for endometrial cancer.   Interval History: Patient notes doing well. Continues to have some pelvic pain, intermittent, cramping. She's using ibuprofen, tramadol (mostly at night for pain and to help her sleep) - can't use Tylenol. Appetite was decreased for 3-4 days, now improved. Didn't have a BM for 3 days after surgery, still struggling with some constipation. She is using prunes, colace, mirilax and senna. Voiding freely. Minimal vaginal bleeding just after surgery, denies any further.   Is using blood thinner daily.  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 , tend to happen with stress, no loc is aware of surrounding's,  controlled with topamax  (02-24-2021  pt stated last one approx 6 months ago)   GERD (gastroesophageal reflux disease)    Herpes genitalis    Hiatal hernia 2007    History of COVID-19 04/2019   per pt mild symptoms that resolved   History of hemorrhagic cerebrovascular accident (CVA) with residual deficit 1996   neurologist-- dr c. 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)    Hemorragic 1996   Thickened endometrium    Vitamin D deficiency    Wears glasses     Past Surgical History:  Procedure Laterality Date   APPENDECTOMY  1955   AUGMENTATION MAMMAPLASTY Bilateral 1610   silicone   BROW LIFT Bilateral 06/07/2019   Procedure: BLEPHAROPLASTY UPPER EYELID WITH EXCESS SKIN;  Surgeon: Karle Starch, MD;  Location: [place];  Service: Ophthalmology;  Laterality: Bilateral;   CATARACT EXTRACTION W/ INTRAOCULAR LENS IMPLANT Bilateral    left 06/ 2006;  right 08/ 2009   DILATATION & CURETTAGE/HYSTEROSCOPY WITH MYOSURE N/A 03/02/2021   Procedure: DILATATION & CURETTAGE/HYSTEROSCOPY WITH MYOSURE;  Surgeon: Landry Mellow,  Baxter Flattery, MD;  Location: [facility];  Service: Gynecology;  Laterality: N/A;   DILATION AND CURETTAGE OF UTERUS  1972   MELANOMA EXCISION  07/1997   left shoulder   ROBOTIC ASSISTED TOTAL HYSTERECTOMY WITH BILATERAL SALPINGO OOPHERECTOMY N/A 03/25/2021   Procedure: XI ROBOTIC ASSISTED TOTAL HYSTERECTOMY WITH BILATERAL SALPINGO OOPHORECTOMY;  Surgeon: Lafonda Mosses, MD;  Location: [facility];  Service: Gynecology;  Laterality: N/A;   SENTINEL NODE BIOPSY N/A 03/25/2021   Procedure: SENTINEL NODE BIOPSY;  Surgeon: Lafonda Mosses, MD;  Location: [facility];  Service: Gynecology;  Laterality: N/A;   thrombosed hemorrhoid  surgery     [place]   right leg   VULVA /PERINEUM BIOPSY N/A 03/25/2021   Procedure: VULVAR BIOPSY;  Surgeon: Lafonda Mosses, MD;  Location: [facility];  Service: Gynecology;  Laterality: N/A;    Family History  Problem Relation Age of Onset   Heart attack Mother    Heart attack Father    Diabetes Father    Atrial fibrillation Father    Lymphoma Brother    Cancer Maternal Grandmother        esophageal   Seizures Neg Hx    Colon cancer Neg Hx    Breast cancer Neg Hx    Ovarian cancer Neg Hx    Endometrial cancer Neg Hx    Pancreatic cancer Neg Hx    Prostate cancer Neg Hx     Social History   Socioeconomic History   Marital status: Married    Spouse name: Not on file   Number of children: 3   Years of education: 13   Highest education level: Not on file  Occupational History   Occupation: retired  Tobacco Use   Smoking status: Former    Years: 20.00    Types: Cigarettes    Quit date: 09/26/1989    Years since quitting: 31.5   Smokeless tobacco: Never  Vaping Use   Vaping Use: Never used  Substance and Sexual Activity   Alcohol use: Yes    Alcohol/week: 14.0 standard drinks    Types: 14 Glasses of wine per week    Comment: per pt 2 wine daily   Drug use: Never   Sexual activity: Not Currently    Birth control/protection: Post-menopausal  Other Topics Concern   Not on file  Social History Narrative   Lives at home w/ her husband   Patient drinks 1-2 cups of caffeine daily.   Patient is right handed.   Social Determinants of Health   Financial Resource Strain: Not on file  Food Insecurity: Not on file  Transportation Needs: Not on file   Physical Activity: Not on file  Stress: Not on file  Social Connections: Not on file    Current Medications:  Current Outpatient Medications:    clobetasol cream (TEMOVATE) 4.09 %, Apply 1 application topically 2 (two) times a week., Disp: , Rfl:    docusate sodium (COLACE) 100 MG capsule, Take 100 mg by mouth daily as needed for mild constipation., Disp: , Rfl:    enoxaparin (LOVENOX) 40 MG/0.4ML injection, Inject 0.4 mLs (40 mg total) into the skin daily for 28 days., Disp: 11.2 mL, Rfl: 0   famotidine (PEPCID) 40 MG tablet, Take 40 mg by mouth 2 (two) times daily., Disp: , Rfl:    fexofenadine (ALLEGRA) 180 MG tablet, Take 180 mg by mouth every evening.,  Disp: , Rfl:    ibuprofen (ADVIL) 800 MG tablet, Take 1 tablet (800 mg total) by mouth every 8 (eight) hours as needed for moderate pain or mild pain., Disp: 30 tablet, Rfl: 0   LORazepam (ATIVAN) 1 MG tablet, Take 1 mg by mouth at bedtime., Disp: , Rfl: 1   psyllium (METAMUCIL) 58.6 % packet, Take 1 packet by mouth daily as needed (constipation)., Disp: , Rfl:    topiramate (TOPAMAX) 100 MG tablet, TAKE 1 TABLET BY MOUTH IN THE MORNING AND 2 IN THE EVENING, Disp: 270 tablet, Rfl: 1   traMADol (ULTRAM) 50 MG tablet, Take 1 tablet (50 mg total) by mouth every 6 (six) hours as needed for severe pain. For AFTER surgery, do not take and drive, Disp: 10 tablet, Rfl: 0   valACYclovir (VALTREX) 500 MG tablet, Take 500 mg by mouth every evening., Disp: , Rfl:   Review of Symptoms: Pertinent positives as per HPI.  Physical Exam: There were no vitals taken for this visit. Deferred given limitations of phone visit.  Laboratory & Radiologic Studies: A. LYMPH NODE, SENTINEL, LEFT OBTURATOR, EXCISION:  - One lymph node, negative for malignancy (0/1).   B. LYMPH NODE, SENTINEL, LEFT EXTERNAL ILIAC, EXCISION:  - One lymph node, negative for malignancy (0/1).   C. LYMPH NODE, SENTINEL, RIGHT PRESACRAL, EXCISION:  - One lymph node, negative for  malignancy (0/1).   D. 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       - Background endometrioid intraepithelial neoplasia (EIN).       - See Oncology Table.  - Myometrium:       - Superficial involvement by endometrioid adenocarcinoma, less than  50% myometrial invasion.  - Uterine cervix:       - Benign transformation zone.       - Negative for squamous intraepithelial lesion and malignancy.  - Fallopian tubes:       - Benign paratubal cysts.       - Otherwise no significant histopathologic change.  - Ovaries:       - Benign physiologic changes.   E. VULVA, BIOPSIES:  - Benign atrophic squamous mucosa.  - Negative for squamous intraepithelial lesion and malignancy.   Assessment & Plan: [name] is a 80 y.o. 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   Doing well, meeting most milestones. I suspect pelvic pain may be related to constipation still. Encouraged her to continue bowel regiment, increase fiber and water intake. Script sent in for few additional tramadol. Also recommended heat pack use.  Discussed pathology in detail. Will ask for clarification from pathology regarding grade 1 or grade 2 histology as this would change whether patient met HIR criteria and would benefit from adjuvant VBT.  I discussed the assessment and treatment plan with the patient. The patient was provided with an opportunity to ask questions and all were answered. The patient agreed with the plan and demonstrated an understanding of the instructions.   The patient was advised to call back or see an in-person evaluation if the symptoms worsen or if the condition fails to improve as anticipated.   26 minutes of total time was spent for this patient encounter, including preparation, face-to-face counseling with the patient and  coordination of care, and documentation of the encounter.   Jeral Pinch, MD  Division of Gynecologic  Oncology  Department of Obstetrics and Gynecology  [facility]

## 2021-04-02 ENCOUNTER — Telehealth: Payer: Self-pay

## 2021-04-02 NOTE — Telephone Encounter (Signed)
[name] states that she had a small BM this am.  Her abdomen hurt when she had BM. Told her that when she moves her bowels that she uses her abdominal muscles.  She just had surgery so the when she bears down to move bowels her abdomen could hurt. She states that her pain level is a 5/10 .  She has not taken any ibuprofen or tramadol since yesterday. Suggested that she take ibuprofen 800 mg now with food and take it regularly with food every 8 hours.  This may help her be mor comfortable and she may not need or use less tramadol.  The tramadol slows the bowels down and contribute to the constipation. Directed her to take a capful of Miralax bid. The colace and senokot with 8 oz of water in the am. If bowels get to loose, she can hold the doses of laxatives and stool softeners. She can add back if needed and adjust according to the consistency of the stools.  Pt wrote information down

## 2021-04-05 ENCOUNTER — Other Ambulatory Visit: Payer: Self-pay | Admitting: Oncology

## 2021-04-05 ENCOUNTER — Telehealth: Payer: Self-pay | Admitting: Oncology

## 2021-04-05 ENCOUNTER — Telehealth: Payer: Self-pay

## 2021-04-05 NOTE — Telephone Encounter (Signed)
Following up with Ms. Weigelt regarding constipation. She states that she followed the advice of Barbaraann Share, Therapist, sports and feels so much better. She had 6 BM's on the 17th and has been slowly tapering down the laxatives and stool softeners. She had one BM today. She is very grateful of her help. She states she is anxiously awaiting to see if her cancer is grade 1 or grade 2. Instructed patient that once the results are in someone from our office will reach out to her. Patient verbalized understanding and appreciative of phone call.

## 2021-04-05 NOTE — Telephone Encounter (Signed)
Called [name] and notified her that her case was presented at the [facility] this morning.  Discussed that her pathology will be reviewed to see if she is a grade 1 or 2 and that we will notify her of the results.

## 2021-04-05 NOTE — Progress Notes (Signed)
Gynecologic Oncology Multi-Disciplinary Disposition Conference Note  Date of the Conference: 04/05/2021  Patient Name: [name]  Referring Provider: Dr. Landry Mellow Primary GYN Oncologist: Dr. Berline Lopes  Stage/Disposition:  Clinical stage 1, grade 1-2. Disposition is for a pathology review to see if grade is one or two.  Referral to Radiation Oncology for vaiginal brachytherapy based on review results.   This Multidisciplinary conference took place involving physicians from [place], [place], Radiation Oncology, Pathology, Radiology along with the Gynecologic Oncology Nurse Practitioner and RN.  Comprehensive assessment of the patient's malignancy, staging, need for surgery, chemotherapy, radiation therapy, and need for further testing were reviewed. Supportive measures, both inpatient and following discharge were also discussed. The recommended plan of care is documented. Greater than 35 minutes were spent correlating and coordinating this patient's care.

## 2021-04-08 ENCOUNTER — Telehealth: Payer: Self-pay

## 2021-04-08 NOTE — Telephone Encounter (Signed)
Pt called our office requesting information about her cancer grade results. Informed pt that Santiago Glad will reach out to her when the results are available.  Pt also asked if she could stop taking her Lovenox injections two weeks early because they are painful. Pt was informed of the importance of completing the 4 week course of injections recommended by Dr. Berline Lopes. Pt verbalized understanding.

## 2021-04-15 ENCOUNTER — Encounter: Payer: Self-pay | Admitting: Gynecologic Oncology

## 2021-04-15 LAB — SURGICAL PATHOLOGY

## 2021-04-16 ENCOUNTER — Encounter: Payer: Self-pay | Admitting: Gynecologic Oncology

## 2021-04-16 ENCOUNTER — Inpatient Hospital Stay: Payer: PPO | Attending: Gynecologic Oncology | Admitting: Gynecologic Oncology

## 2021-04-16 ENCOUNTER — Other Ambulatory Visit: Payer: Self-pay

## 2021-04-16 VITALS — BP 117/41 | HR 65 | Temp 97.0°F | Resp 18 | Wt 164.6 lb

## 2021-04-16 DIAGNOSIS — B3731 Acute candidiasis of vulva and vagina: Secondary | ICD-10-CM

## 2021-04-16 DIAGNOSIS — Z90710 Acquired absence of both cervix and uterus: Secondary | ICD-10-CM

## 2021-04-16 DIAGNOSIS — Z7189 Other specified counseling: Secondary | ICD-10-CM

## 2021-04-16 DIAGNOSIS — C541 Malignant neoplasm of endometrium: Secondary | ICD-10-CM

## 2021-04-16 DIAGNOSIS — Z90722 Acquired absence of ovaries, bilateral: Secondary | ICD-10-CM

## 2021-04-16 MED ORDER — FLUCONAZOLE 150 MG PO TABS: 150.0000 mg | ORAL_TABLET | Freq: Every day | ORAL | 0 refills | Status: DC

## 2021-04-16 NOTE — Patient Instructions (Signed)
It was great to see you today!  You are healing very well from surgery.  I would recommend continuing the Lovenox for the full 4 weeks.  I will send a prescription for a one-time dose of Diflucan, yeast medication, to your pharmacy.  Please call next week to let me know how your symptoms are.  Remember no lifting more than 10 pounds until 6 weeks after surgery and nothing in the vagina for at least 8 weeks.  As we discussed today, your risk of recurrence is very low and no additional treatment is indicated.  I do recommend follow-up every 6 months, that we will alternate between my office and her gynecologist.  Please move your appointment with your gynecologist to June.  Please call my office over the summer to get your next visit scheduled with me in December 2023.

## 2021-04-16 NOTE — Progress Notes (Signed)
Gynecologic Oncology Return Clinic Visit  04/16/2021  Reason for Visit: Follow-up after surgery, treatment planning  Treatment History: Oncology History Overview Note  MMR IHC intact   Endometrial cancer [facility])   Initial Diagnosis   Endometrial cancer ([place])   03/02/2021 Initial Biopsy   hysteroscopy with endometrial sampling.  Final pathology from this showed endometrial adenocarcinoma, endometrioid type, FIGO grade 1.  Findings at the time of surgery were endometrial polyps, vaginal and vulvar atrophy.   03/25/2021 Surgery   TRH/BSO, SLN biopsy  Findings: On EUA, small mobile uterus.  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  On intra-abdominal entry, normal upper abdominal survey.  Omentum adherent to the anterior abdominal wall along the midline just inferior to the umbilicus, also along the right abdominal sidewall.  Otherwise omentum normal-appearing.  Normal-appearing small and large bowel.  Uterus 6 cm and normal appearing.  Bilateral adnexa atrophic and normal-appearing.  No obvious adenopathy.  Mapping successful on the right to a presacral node, on the left to a deep obturator and external iliac sentinel lymph node.  Minimal adhesions of the bladder to the cervix.  No intra-abdominal or pelvic evidence of disease.   03/25/2021 Pathology Results   A. LYMPH NODE, SENTINEL, LEFT OBTURATOR, EXCISION:  - One lymph node, negative for malignancy (0/1).   B. LYMPH NODE, SENTINEL, LEFT EXTERNAL ILIAC, EXCISION:  - One lymph node, negative for malignancy (0/1).   C. LYMPH NODE, SENTINEL, RIGHT PRESACRAL, EXCISION:  - One lymph node, negative for malignancy (0/1).   D. 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       - Background endometrioid intraepithelial neoplasia (EIN).       - See  Oncology Table.  - Myometrium:       - Superficial involvement by endometrioid adenocarcinoma, less than  50% myometrial invasion.  - Uterine cervix:       - Benign transformation zone.       - Negative for squamous intraepithelial lesion and malignancy.  - Fallopian tubes:       - Benign paratubal cysts.       - Otherwise no significant histopathologic change.  - Ovaries:       - Benign physiologic changes.   E. VULVA, BIOPSIES:  - Benign atrophic squamous mucosa.  - Negative for squamous intraepithelial lesion and malignancy.   ONCOLOGY TABLE:   UTERUS, CARCINOMA OR CARCINOSARCOMA: Resection   Procedure: Total hysterectomy and bilateral salpingo-oophorectomy  Histologic Type: Endometrioid adenocarcinoma, NOS  Histologic Grade: Low-grade (encompassing FIGO 1 and 2)  Myometrial Invasion:       Depth of Myometrial Invasion (mm): 4 mm       Myometrial Thickness (mm): 16 mm       Percentage of Myometrial Invasion: 25%  Uterine Serosa Involvement: Not identified  Cervical stromal Involvement: Not identified  Extent of involvement of other tissue/organs: Not identified  Peritoneal/Ascitic Fluid: Not submitted/unknown  Lymphovascular Invasion: Not identified   Regional Lymph Nodes: Regional lymph nodes present  All regional lymph nodes negative for tumor cells       Pelvic Lymph Nodes Examined:            3 Sentinel  0 non-sentinel                            3 total       Pelvic Lymph Nodes with Metastasis: 0        Para-aortic Lymph Nodes Examined:  0 Sentinel                            0 non-sentinel                            0 total       Para-aortic Lymph Nodes with Metastasis: 0   Distant Metastasis:       Distant Site(s) Involved: Not applicable  Pathologic Stage Classification (pTNM, AJCC 8th Edition): pT1a, pN0  Ancillary Studies: MMR/MSI testing will be ordered  Representative Tumor Block: D2  Comment(s): Pancytokeratin was performed  on the lymph nodes and is  negative   Reason for Addendum #1:  Report Clarification  Reason for Addendum #2:  DNA Mismatch Repair IHC Results   Clinical History: Endometrial cancer ([facility])      FINAL MICROSCOPIC DIAGNOSIS:   A. LYMPH NODE, SENTINEL, LEFT OBTURATOR, EXCISION:  - One lymph node, negative for malignancy (0/1).   B. LYMPH NODE, SENTINEL, LEFT EXTERNAL ILIAC, EXCISION:  - One lymph node, negative for malignancy (0/1).   C. LYMPH NODE, SENTINEL, RIGHT PRESACRAL, EXCISION:  - One lymph node, negative for malignancy (0/1).   D. 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       - Background endometrioid intraepithelial neoplasia (EIN).       - See Oncology Table.  - Myometrium:       - Superficial involvement by endometrioid adenocarcinoma, less than  50% myometrial invasion.  - Uterine cervix:       - Benign transformation zone.       - Negative for squamous intraepithelial lesion and malignancy.  - Fallopian tubes:       - Benign paratubal cysts.       - Otherwise no significant histopathologic change.  - Ovaries:       - Benign physiologic changes.   E. VULVA, BIOPSIES:  - Benign atrophic squamous mucosa.  - Negative for squamous intraepithelial lesion and malignancy.   ONCOLOGY TABLE:   UTERUS, CARCINOMA OR CARCINOSARCOMA: Resection   Procedure: Total hysterectomy and bilateral salpingo-oophorectomy  Histologic Type: Endometrioid adenocarcinoma, NOS  Histologic Grade: Low-grade (encompassing FIGO 1 and 2)  Myometrial Invasion:       Depth of Myometrial Invasion (mm): 4 mm       Myometrial Thickness (mm): 16 mm       Percentage of Myometrial Invasion: 25%  Uterine Serosa Involvement: Not identified  Cervical stromal Involvement: Not identified  Extent of involvement of other tissue/organs: Not identified  Peritoneal/Ascitic Fluid: Not submitted/unknown   Lymphovascular Invasion: Not identified   Regional Lymph Nodes: Regional lymph nodes present  All regional lymph nodes negative for tumor cells       Pelvic Lymph Nodes Examined:            3 Sentinel                            0 non-sentinel  3 total       Pelvic Lymph Nodes with Metastasis: 0        Para-aortic Lymph Nodes Examined:  0 Sentinel                            0 non-sentinel                            0 total       Para-aortic Lymph Nodes with Metastasis: 0   Distant Metastasis:       Distant Site(s) Involved: Not applicable  Pathologic Stage Classification (pTNM, AJCC 8th Edition): pT1a, pN0  Ancillary Studies: MMR/MSI testing will be ordered  Representative Tumor Block: D2  Comment(s): Pancytokeratin was performed on the lymph nodes and is  negative   (v4.2.0.1)    GROSS DESCRIPTION:   Specimen A: Received fresh is a 2.8 x 1.2 x 0.3 cm yellow-pink soft to  rubbery nodule which is sectioned and entirely submitted in 1 block.   Specimen B: Received fresh is a 1.5 x 0.4 x 0.3 cm yellow-pink soft to  rubbery nodule which is sectioned and entirely submitted 1 block.   Specimen C: Received fresh is a 0.4 cm yellow-pink soft to rubbery  nodule which is bisected and submitted 1 block.   Specimen D: Uterus including cervix and attached bilateral fallopian  tubes and ovaries, received fresh.  Specimen integrity: Intact  Size and shape: The uterine body is symmetrical, 5 x 4.4 x 3.7 cm.  Weight: 54 g without adnexa  Serosa: Pink-purple, smooth  Cervix: 2.6 cm in length, 2.5 cm in diameter, has a pink-white to  hyperemic smooth, focally blue stained ectocervix, and smooth  endocervix.  Endometrium: The endometrial cavity is 3 x 2 cm, and both anterior and  posterior surfaces have tan-pink to dark red smooth, flattened lining.  A discrete mass is not identified.  Myometrium: The tissue just beneath the endometrium in the upper half is   tan-white, indurated, nodular, and is up to 0.7 cm thick in an area of  myometrium 1.5 cm thick.  Remaining outer half of myometrium is tan-pink  to hyperemic, smooth.  There are no discrete intramural nodules.  Right adnexa: The right fallopian tube there is a 4.6 cm in length and  up to 0.9 cm in diameter fimbriated segment, which has a pink-purple  smooth serosa.  On sectioning, the lumen is dilated up to 0.8 cm and  filled with dark red fluid.  The right ovary is 1.8 x 1.1 x 1 cm, has a  pale yellow-white to dark purple smooth surface and unremarkable cut  surfaces.  Left adnexa: The portion of left tube is a distal 2.1 cm in length and  up to 0.4 cm in diameter distal fimbriated segment which has a  pink-purple smooth serosa and unremarkable cut surfaces.  Proximal half  is surgically absent.  The left ovary is 1.3 x 1.1 x 0.8 cm, has a  pink-white smooth surface and unremarkable cut surface.  Between the  left ovary and portion of left tube is a 2.1 cm smooth clear  fluid-filled cyst.  Block Summary:  Blocks 1-5 = full-thickness endomyometrium, anterior  Block 6-10 = full-thickness endomyometrium, posterior  Block 11 = cervix  Block 12 = sections of right tube and ovary  Block 13 = fimbriated end of right tube, bisected lengthwise  Block 14 =  sections of left tube and ovary and intervening cyst  Block 15 = fimbriated end of left tube, bisected lengthwise   Specimen E: Received fresh are 2 minute pieces of gray-white to dark red  soft tissue/material, in toto 1 block.   SW 03/26/2021     Final Diagnosis performed by Allena Napoleon, MD.   Electronically signed  04/01/2021  Technical component performed at [facility], [place]  [address], [place], [place] [address].   Professional component performed at [facility].  [address] [place], [place] [address]  Immunohistochemistry  Technical component (if applicable) was performed  at [facility]. [address], [place], [place], [place]  [address].  IMMUNOHISTOCHEMISTRY DISCLAIMER (if applicable): Some of these  immunohistochemical stains may have been developed and the performance  characteristics determine by [facility]. Some may not have  been cleared or approved by the U.S. Food and Drug Administration. The  FDA has determined that such clearance or approval is not necessary.  This test is used for clinical purposes. It should not be regarded as  investigational or for research. This laboratory is certified under the  [place] (CLIA-88) as  qualified to perform high complexity clinical laboratory testing.  The  controls stained appropriately.   ADDENDUM:  The endometrioid adenocarcinoma is low-grade and well  differentiated consistent with FIGO grade 1.    03/25/2021 Pathologic Stage   Stage IA, grade 1 25% MI Neg SLNs No LVI     Interval History: Presents for follow-up today.  Notes overall doing well.  Denies any abdominal or pelvic pain.  Denies any vaginal bleeding or discharge.  Struggled some with constipation after surgery, bowel function is normal now with MiraLAX.  Denies any urinary symptoms.  Reports good appetite without nausea or emesis.  Has noticed some vaginal irritation and white exudate. Has an area on gluteal cleft that has been painful.  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 , tend to happen with stress, no loc is aware of surrounding's,  controlled with topamax  (02-24-2021  pt stated last one approx 6 months ago)   GERD (gastroesophageal reflux disease)    Herpes  genitalis    Hiatal hernia 2007   History of COVID-19 04/2019   per pt mild symptoms that resolved   History of hemorrhagic cerebrovascular accident (CVA) with residual deficit 1996   neurologist-- dr c. 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    Lichen sclerosus    labia   Osteopenia    Personal history of PE (pulmonary embolism) 1975   per pt post op breast augmentation/ BTL one week,  stated completed anticoagation, and none before or after   PMB (postmenopausal bleeding)    gyn-- dr  cole   Pneumonia    Stroke [facility])    Hemorragic 1996   Thickened endometrium    Vitamin D deficiency    Wears glasses     Past Surgical History:  Procedure Laterality Date   APPENDECTOMY  1955   AUGMENTATION MAMMAPLASTY Bilateral 6834   silicone   BROW LIFT Bilateral 06/07/2019   Procedure: BLEPHAROPLASTY UPPER EYELID WITH EXCESS SKIN;  Surgeon: Karle Starch, MD;  Location: [facility];  Service: Ophthalmology;  Laterality: Bilateral;   CATARACT EXTRACTION W/ INTRAOCULAR LENS IMPLANT Bilateral    left 06/ 2006;  right 08/ 2009   DILATATION & CURETTAGE/HYSTEROSCOPY WITH MYOSURE N/A 03/02/2021   Procedure: DILATATION & CURETTAGE/HYSTEROSCOPY WITH MYOSURE;  Surgeon: Christophe Louis, MD;  Location: [facility];  Service: Gynecology;  Laterality: N/A;   DILATION AND CURETTAGE OF UTERUS  1972   MELANOMA EXCISION  07/1997   left shoulder   ROBOTIC ASSISTED TOTAL HYSTERECTOMY WITH BILATERAL SALPINGO OOPHERECTOMY N/A 03/25/2021   Procedure: XI ROBOTIC ASSISTED TOTAL HYSTERECTOMY WITH BILATERAL SALPINGO  OOPHORECTOMY;  Surgeon: Lafonda Mosses, MD;  Location: [facility];  Service: Gynecology;  Laterality: N/A;   SENTINEL NODE BIOPSY N/A 03/25/2021   Procedure: SENTINEL NODE BIOPSY;  Surgeon: Lafonda Mosses, MD;  Location: [facility];  Service: Gynecology;  Laterality: N/A;   thrombosed hemorrhoid  surgery     [place]   right leg   VULVA /PERINEUM BIOPSY N/A 03/25/2021   Procedure: VULVAR BIOPSY;  Surgeon: Lafonda Mosses, MD;  Location: [facility];  Service: Gynecology;  Laterality: N/A;    Family History  Problem Relation Age of Onset   Heart attack Mother    Heart attack Father    Diabetes Father    Atrial fibrillation Father    Lymphoma Brother    Cancer Maternal Grandmother        esophageal   Seizures Neg Hx    Colon cancer Neg Hx    Breast cancer Neg Hx    Ovarian cancer Neg Hx    Endometrial cancer Neg Hx    Pancreatic cancer Neg Hx    Prostate cancer Neg Hx     Social History   Socioeconomic History   Marital status: Married    Spouse name: Not on file   Number of children: 3   Years of education: 13   Highest education level: Not on file  Occupational History   Occupation: retired  Tobacco Use   Smoking status: Former    Years: 20.00    Types: Cigarettes    Quit date: 09/26/1989    Years since quitting: 31.5   Smokeless tobacco: Never  Vaping Use   Vaping Use: Never used  Substance and Sexual Activity   Alcohol use: Yes    Alcohol/week: 14.0 standard drinks    Types: 14 Glasses of wine per week    Comment: per pt 2 wine daily   Drug use: Never   Sexual activity: Not Currently    Birth control/protection: Post-menopausal  Other Topics Concern   Not on file  Social History Narrative   Lives at home w/ her husband   Patient drinks 1-2 cups of caffeine daily.   Patient is right handed.   Social Determinants of Health   Financial Resource Strain: Not on file  Food Insecurity: Not on  file  Transportation Needs: Not on file  Physical Activity: Not  on file  Stress: Not on file  Social Connections: Not on file    Current Medications:  Current Outpatient Medications:    clobetasol cream (TEMOVATE) 5.78 %, Apply 1 application topically 2 (two) times a week., Disp: , Rfl:    docusate sodium (COLACE) 100 MG capsule, Take 100 mg by mouth daily as needed for mild constipation., Disp: , Rfl:    enoxaparin (LOVENOX) 40 MG/0.4ML injection, Inject 0.4 mLs (40 mg total) into the skin daily for 28 days., Disp: 11.2 mL, Rfl: 0   famotidine (PEPCID) 40 MG tablet, Take 40 mg by mouth 2 (two) times daily., Disp: , Rfl:    fexofenadine (ALLEGRA) 180 MG tablet, Take 180 mg by mouth every evening., Disp: , Rfl:    fluconazole (DIFLUCAN) 150 MG tablet, Take 1 tablet (150 mg total) by mouth daily., Disp: 1 tablet, Rfl: 0   ibuprofen (ADVIL) 800 MG tablet, Take 1 tablet (800 mg total) by mouth every 8 (eight) hours as needed for moderate pain or mild pain., Disp: 30 tablet, Rfl: 0   LORazepam (ATIVAN) 1 MG tablet, Take 1 mg by mouth at bedtime., Disp: , Rfl: 1   polyethylene glycol (MIRALAX / GLYCOLAX) 17 g packet, Take 17 g by mouth daily., Disp: , Rfl:    topiramate (TOPAMAX) 100 MG tablet, TAKE 1 TABLET BY MOUTH IN THE MORNING AND 2 IN THE EVENING, Disp: 270 tablet, Rfl: 1   valACYclovir (VALTREX) 500 MG tablet, Take 500 mg by mouth every evening., Disp: , Rfl:    psyllium (METAMUCIL) 58.6 % packet, Take 1 packet by mouth daily as needed (constipation). (Patient not taking: Reported on 04/15/2021), Disp: , Rfl:    traMADol (ULTRAM) 50 MG tablet, Take 1 tablet (50 mg total) by mouth every 6 (six) hours as needed for up to 10 doses for severe pain. For AFTER surgery, do not take and drive (Patient not taking: Reported on 04/15/2021), Disp: 10 tablet, Rfl: 0  Review of Systems: Denies appetite changes, fevers, chills, fatigue, unexplained weight changes. Denies hearing loss, neck lumps or  masses, mouth sores, ringing in ears or voice changes. Denies cough or wheezing.  Denies shortness of breath. Denies chest pain or palpitations. Denies leg swelling. Denies abdominal distention, pain, blood in stools, constipation, diarrhea, nausea, vomiting, or early satiety. Denies pain with intercourse, dysuria, frequency, hematuria or incontinence. Denies hot flashes, pelvic pain, vaginal bleeding or vaginal discharge.   Denies joint pain, back pain or muscle pain/cramps. Denies itching, rash, or wounds. Denies dizziness, headaches, numbness or seizures. Denies swollen lymph nodes or glands, denies easy bruising or bleeding. Denies anxiety, depression, confusion, or decreased concentration.  Physical Exam: BP (!) 117/41    Pulse 65    Temp (!) 97 F (36.1 C)    Resp 18    Wt 164 lb 9.6 oz (74.7 kg)    SpO2 100%    BMI 29.16 kg/m  General: Alert, oriented, no acute distress. HEENT: Normocephalic, atraumatic, sclera anicteric. Chest: Unlabored breathing on room air. Abdomen: soft, nontender.  Normoactive bowel sounds.  No masses or hepatosplenomegaly appreciated.  Well-healed incisions, remaining Dermabond removed.  Some bruising related to Lovenox injection sites. Extremities: Grossly normal range of motion.  Warm, well perfused.  No edema bilaterally. Skin: Small, less than 1 cm area that appears consistent with a blister noted along the right intergluteal cleft, with minimal exudate over.  No fluid. GU: Erythema and irritation noted of bilateral inner labia with minimal white exudate.  Speculum exam reveals cuff intact, suture still visible, no bleeding or discharge.  Bimanual exam reveals cuff intact, no internal tenderness or fluctuance.  Patient has significant tenderness with speculum exam and bimanual exam given vulvar tenderness.   Laboratory & Radiologic Studies: None new  Assessment & Plan: [name] is a 80 y.o. 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  Patient is overall doing well and meeting postoperative milestones.  We discussed continued expectations as well as restrictions.  Given symptoms as well as vulvar appearance, I suspect that she has a Candida infection.  Prescription for Diflucan was sent to her pharmacy.  I have asked her to call next week to let me know how her symptoms are.  Patient was given a copy of her final pathology report with addendum.  She is thrilled with the news that tumor is low-grade.  Given this, risk of recurrence is low and no adjuvant therapy is indicated.  We discussed importance, per NCCN surveillance guidelines, of continued surveillance every 6 months for the next 5 years and then yearly thereafter.  I offered that this could be with me or alternating between my clinic and her OB/GYN.  She would like to continue seeing her OB/GYN every year.  She has a visit scheduled in February which I have recommended she change to June.  I have then asked her to call over the summer to schedule a visit with me back in December.  We reviewed signs and symptoms that would be concerning for cancer recurrence, and I have stressed the importance of calling if she develops any of these between visits.  32 minutes of total time was spent for this patient encounter, including preparation, face-to-face counseling with the patient and coordination of care, and documentation of the encounter.  Jeral Pinch, MD  Division of Gynecologic Oncology  Department of Obstetrics and Gynecology  [facility]

## 2021-04-22 ENCOUNTER — Other Ambulatory Visit: Payer: Self-pay | Admitting: Gynecologic Oncology

## 2021-04-22 ENCOUNTER — Encounter: Payer: Self-pay | Admitting: Gynecologic Oncology

## 2021-04-22 ENCOUNTER — Telehealth: Payer: Self-pay

## 2021-04-22 DIAGNOSIS — B3731 Acute candidiasis of vulva and vagina: Secondary | ICD-10-CM

## 2021-04-22 DIAGNOSIS — B379 Candidiasis, unspecified: Secondary | ICD-10-CM

## 2021-04-22 MED ORDER — FLUCONAZOLE 150 MG PO TABS: 150.0000 mg | ORAL_TABLET | Freq: Every day | ORAL | 0 refills | Status: DC

## 2021-04-22 MED ORDER — NYSTATIN-TRIAMCINOLONE 100000-0.1 UNIT/GM-% EX OINT: 1.0000 "application " | TOPICAL_OINTMENT | Freq: Two times a day (BID) | CUTANEOUS | 1 refills | Status: DC

## 2021-04-22 NOTE — Progress Notes (Signed)
See media for picture of buttocks erythema.

## 2021-04-22 NOTE — Telephone Encounter (Signed)
Returning call to [name]. Per Dr. Berline Lopes advised patient to continue to use Vaseline as a barrier and to avoid putting pressure on the area. Instructed patient to not sleep on her back and to sit so theres no pressure on the affected area. She can try getting a donut cushion to sit on to help with this. Patient verbalized understanding. Patient states she may still have some yeast. She reports slight vulvar itching and itching to the buttocks area. No burning with urination and no discharge. Patient states she is going to send a picture of the redness on her backside. [name], NP notified. Instructed patient that someone from the office will call and follow up with her once the images are reviewed.

## 2021-04-22 NOTE — Telephone Encounter (Signed)
Returning call to Ms. [place] re: yeast infection update. Spoke with patients husband [name]. He reports she is doing better and feels like her symptoms have subsided in regards to the yeast infection. She has some light redness on her vulva. [name] does not believe she has any itching or discharge. She has a deep red rash on top of her anus. She applied neosporin cream to the area but felt like she developed an allergy to the cream as it made the area worse. She stopped using the neosporin and has started using Vaseline on the site. Vaseline has helped. She has also developed a rash approximately 3 inches in diameter on her buttocks. Patient and her husband are inquiring if she needs to follow up with Dr. Berline Lopes or her PCP Dr. Laurann Montana. Advised that Dr. Berline Lopes will be notified and someone from our office will follow up with them.

## 2021-04-22 NOTE — Telephone Encounter (Signed)
Following up with [name].  Patient images have been reviewed. Per [name], NP advised patient that a prescription for diflucan and topical cream (miconazole) will be called into patients pharmacy.  Instructed patient to apply cream to affected buttock area. Ensure the area is clean and dry (can use blow dryer on low, cool setting), apply cream and use gauze or a washcloth to separate skin folds so the the skin is not sticking together. Patient verbalized understanding. Instructed to call with any questions or concerns.

## 2021-05-03 ENCOUNTER — Encounter: Payer: Self-pay | Admitting: Gynecologic Oncology

## 2021-05-03 ENCOUNTER — Telehealth: Payer: Self-pay

## 2021-05-03 NOTE — Telephone Encounter (Signed)
Following up with [name] this afternoon. Patient states she is having the same symptoms as before. Uncomfortable burning with urination (to tissue and skin around urethra).Vulvar itching. Pain/discomfort with sitting. Denies discharge. No odors. She reports the area on her buttocks has improved. She has tried increasing her consumption of yogurt and probiotics with no relief. She states the fluconazole did not help her. MD notified.   Per Dr. Berline Lopes advised patient to apply her clobetasol cream to vulva for itching and to follow up with [place] in office. Appointment scheduled for this Wednesday 05/05/21 at 3:30 pm. Patient verbalizes understanding and is in agreement of appointment date and time.

## 2021-05-04 ENCOUNTER — Encounter: Payer: Self-pay | Admitting: Gynecologic Oncology

## 2021-05-05 ENCOUNTER — Other Ambulatory Visit: Payer: Self-pay

## 2021-05-05 ENCOUNTER — Encounter: Payer: Self-pay | Admitting: Gynecologic Oncology

## 2021-05-05 ENCOUNTER — Inpatient Hospital Stay: Payer: PPO | Attending: Gynecologic Oncology | Admitting: Gynecologic Oncology

## 2021-05-05 VITALS — BP 143/58 | HR 56 | Temp 97.5°F | Resp 18 | Ht 62.99 in | Wt 165.4 lb

## 2021-05-05 DIAGNOSIS — N393 Stress incontinence (female) (male): Secondary | ICD-10-CM | POA: Insufficient documentation

## 2021-05-05 DIAGNOSIS — N9089 Other specified noninflammatory disorders of vulva and perineum: Secondary | ICD-10-CM | POA: Insufficient documentation

## 2021-05-05 DIAGNOSIS — Z90722 Acquired absence of ovaries, bilateral: Secondary | ICD-10-CM | POA: Insufficient documentation

## 2021-05-05 DIAGNOSIS — Z90710 Acquired absence of both cervix and uterus: Secondary | ICD-10-CM | POA: Diagnosis not present

## 2021-05-05 DIAGNOSIS — C541 Malignant neoplasm of endometrium: Secondary | ICD-10-CM | POA: Insufficient documentation

## 2021-05-05 NOTE — Progress Notes (Signed)
Gynecologic Oncology Return Clinic Visit  05/05/2021  Reason for Visit: Vulvar symptoms  Treatment History: Oncology History Overview Note  MMR IHC intact   Endometrial cancer [facility])   Initial Diagnosis   Endometrial cancer ([place])   03/02/2021 Initial Biopsy   hysteroscopy with endometrial sampling.  Final pathology from this showed endometrial adenocarcinoma, endometrioid type, FIGO grade 1.  Findings at the time of surgery were endometrial polyps, vaginal and vulvar atrophy.   03/25/2021 Surgery   TRH/BSO, SLN biopsy  Findings: On EUA, small mobile uterus.  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  On intra-abdominal entry, normal upper abdominal survey.  Omentum adherent to the anterior abdominal wall along the midline just inferior to the umbilicus, also along the right abdominal sidewall.  Otherwise omentum normal-appearing.  Normal-appearing small and large bowel.  Uterus 6 cm and normal appearing.  Bilateral adnexa atrophic and normal-appearing.  No obvious adenopathy.  Mapping successful on the right to a presacral node, on the left to a deep obturator and external iliac sentinel lymph node.  Minimal adhesions of the bladder to the cervix.  No intra-abdominal or pelvic evidence of disease.   03/25/2021 Pathology Results   A. LYMPH NODE, SENTINEL, LEFT OBTURATOR, EXCISION:  - One lymph node, negative for malignancy (0/1).   B. LYMPH NODE, SENTINEL, LEFT EXTERNAL ILIAC, EXCISION:  - One lymph node, negative for malignancy (0/1).   C. LYMPH NODE, SENTINEL, RIGHT PRESACRAL, EXCISION:  - One lymph node, negative for malignancy (0/1).   D. 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       - Background endometrioid intraepithelial neoplasia (EIN).       - See Oncology Table.  - Myometrium:        - Superficial involvement by endometrioid adenocarcinoma, less than  50% myometrial invasion.  - Uterine cervix:       - Benign transformation zone.       - Negative for squamous intraepithelial lesion and malignancy.  - Fallopian tubes:       - Benign paratubal cysts.       - Otherwise no significant histopathologic change.  - Ovaries:       - Benign physiologic changes.   E. VULVA, BIOPSIES:  - Benign atrophic squamous mucosa.  - Negative for squamous intraepithelial lesion and malignancy.   ONCOLOGY TABLE:   UTERUS, CARCINOMA OR CARCINOSARCOMA: Resection   Procedure: Total hysterectomy and bilateral salpingo-oophorectomy  Histologic Type: Endometrioid adenocarcinoma, NOS  Histologic Grade: Low-grade (encompassing FIGO 1 and 2)  Myometrial Invasion:       Depth of Myometrial Invasion (mm): 4 mm       Myometrial Thickness (mm): 16 mm       Percentage of Myometrial Invasion: 25%  Uterine Serosa Involvement: Not identified  Cervical stromal Involvement: Not identified  Extent of involvement of other tissue/organs: Not identified  Peritoneal/Ascitic Fluid: Not submitted/unknown  Lymphovascular Invasion: Not identified   Regional Lymph Nodes: Regional lymph nodes present  All regional lymph nodes negative for tumor cells       Pelvic Lymph Nodes Examined:            3 Sentinel                            0 non-sentinel  3 total       Pelvic Lymph Nodes with Metastasis: 0        Para-aortic Lymph Nodes Examined:  0 Sentinel                            0 non-sentinel                            0 total       Para-aortic Lymph Nodes with Metastasis: 0   Distant Metastasis:       Distant Site(s) Involved: Not applicable  Pathologic Stage Classification (pTNM, AJCC 8th Edition): pT1a, pN0  Ancillary Studies: MMR/MSI testing will be ordered  Representative Tumor Block: D2  Comment(s): Pancytokeratin was performed on the lymph nodes and is  negative    Reason for Addendum #1:  Report Clarification  Reason for Addendum #2:  DNA Mismatch Repair IHC Results   Clinical History: Endometrial cancer ([facility])      FINAL MICROSCOPIC DIAGNOSIS:   A. LYMPH NODE, SENTINEL, LEFT OBTURATOR, EXCISION:  - One lymph node, negative for malignancy (0/1).   B. LYMPH NODE, SENTINEL, LEFT EXTERNAL ILIAC, EXCISION:  - One lymph node, negative for malignancy (0/1).   C. LYMPH NODE, SENTINEL, RIGHT PRESACRAL, EXCISION:  - One lymph node, negative for malignancy (0/1).   D. 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       - Background endometrioid intraepithelial neoplasia (EIN).       - See Oncology Table.  - Myometrium:       - Superficial involvement by endometrioid adenocarcinoma, less than  50% myometrial invasion.  - Uterine cervix:       - Benign transformation zone.       - Negative for squamous intraepithelial lesion and malignancy.  - Fallopian tubes:       - Benign paratubal cysts.       - Otherwise no significant histopathologic change.  - Ovaries:       - Benign physiologic changes.   E. VULVA, BIOPSIES:  - Benign atrophic squamous mucosa.  - Negative for squamous intraepithelial lesion and malignancy.   ONCOLOGY TABLE:   UTERUS, CARCINOMA OR CARCINOSARCOMA: Resection   Procedure: Total hysterectomy and bilateral salpingo-oophorectomy  Histologic Type: Endometrioid adenocarcinoma, NOS  Histologic Grade: Low-grade (encompassing FIGO 1 and 2)  Myometrial Invasion:       Depth of Myometrial Invasion (mm): 4 mm       Myometrial Thickness (mm): 16 mm       Percentage of Myometrial Invasion: 25%  Uterine Serosa Involvement: Not identified  Cervical stromal Involvement: Not identified  Extent of involvement of other tissue/organs: Not identified  Peritoneal/Ascitic Fluid: Not submitted/unknown  Lymphovascular Invasion: Not  identified   Regional Lymph Nodes: Regional lymph nodes present  All regional lymph nodes negative for tumor cells       Pelvic Lymph Nodes Examined:            3 Sentinel                            0 non-sentinel                            3 total  Pelvic Lymph Nodes with Metastasis: 0        Para-aortic Lymph Nodes Examined:  0 Sentinel                            0 non-sentinel                            0 total       Para-aortic Lymph Nodes with Metastasis: 0   Distant Metastasis:       Distant Site(s) Involved: Not applicable  Pathologic Stage Classification (pTNM, AJCC 8th Edition): pT1a, pN0  Ancillary Studies: MMR/MSI testing will be ordered  Representative Tumor Block: D2  Comment(s): Pancytokeratin was performed on the lymph nodes and is  negative   (v4.2.0.1)    GROSS DESCRIPTION:   Specimen A: Received fresh is a 2.8 x 1.2 x 0.3 cm yellow-pink soft to  rubbery nodule which is sectioned and entirely submitted in 1 block.   Specimen B: Received fresh is a 1.5 x 0.4 x 0.3 cm yellow-pink soft to  rubbery nodule which is sectioned and entirely submitted 1 block.   Specimen C: Received fresh is a 0.4 cm yellow-pink soft to rubbery  nodule which is bisected and submitted 1 block.   Specimen D: Uterus including cervix and attached bilateral fallopian  tubes and ovaries, received fresh.  Specimen integrity: Intact  Size and shape: The uterine body is symmetrical, 5 x 4.4 x 3.7 cm.  Weight: 54 g without adnexa  Serosa: Pink-purple, smooth  Cervix: 2.6 cm in length, 2.5 cm in diameter, has a pink-white to  hyperemic smooth, focally blue stained ectocervix, and smooth  endocervix.  Endometrium: The endometrial cavity is 3 x 2 cm, and both anterior and  posterior surfaces have tan-pink to dark red smooth, flattened lining.  A discrete mass is not identified.  Myometrium: The tissue just beneath the endometrium in the upper half is  tan-white, indurated, nodular,  and is up to 0.7 cm thick in an area of  myometrium 1.5 cm thick.  Remaining outer half of myometrium is tan-pink  to hyperemic, smooth.  There are no discrete intramural nodules.  Right adnexa: The right fallopian tube there is a 4.6 cm in length and  up to 0.9 cm in diameter fimbriated segment, which has a pink-purple  smooth serosa.  On sectioning, the lumen is dilated up to 0.8 cm and  filled with dark red fluid.  The right ovary is 1.8 x 1.1 x 1 cm, has a  pale yellow-white to dark purple smooth surface and unremarkable cut  surfaces.  Left adnexa: The portion of left tube is a distal 2.1 cm in length and  up to 0.4 cm in diameter distal fimbriated segment which has a  pink-purple smooth serosa and unremarkable cut surfaces.  Proximal half  is surgically absent.  The left ovary is 1.3 x 1.1 x 0.8 cm, has a  pink-white smooth surface and unremarkable cut surface.  Between the  left ovary and portion of left tube is a 2.1 cm smooth clear  fluid-filled cyst.  Block Summary:  Blocks 1-5 = full-thickness endomyometrium, anterior  Block 6-10 = full-thickness endomyometrium, posterior  Block 11 = cervix  Block 12 = sections of right tube and ovary  Block 13 = fimbriated end of right tube, bisected lengthwise  Block 14 = sections of left tube and ovary and intervening  cyst  Block 15 = fimbriated end of left tube, bisected lengthwise   Specimen E: Received fresh are 2 minute pieces of gray-white to dark red  soft tissue/material, in toto 1 block.   SW 03/26/2021     Final Diagnosis performed by Allena Napoleon, MD.   Electronically signed  04/01/2021  Technical component performed at [facility], [place]  [address] [place], [place] [address].   Professional component performed at [facility].  [address] [place], [place] [address]  Immunohistochemistry  Technical component (if applicable) was performed at [facility]. [address] [place], [place], [place]  [address].  IMMUNOHISTOCHEMISTRY DISCLAIMER (if applicable): Some of these  immunohistochemical stains may have been developed and the performance  characteristics determine by [facility]. Some may not have  been cleared or approved by the U.S. Food and Drug Administration. The  FDA has determined that such clearance or approval is not necessary.  This test is used for clinical purposes. It should not be regarded as  investigational or for research. This laboratory is certified under the  [place] (CLIA-88) as  qualified to perform high complexity clinical laboratory testing.  The  controls stained appropriately.   ADDENDUM:  The endometrioid adenocarcinoma is low-grade and well  differentiated consistent with FIGO grade 1.    03/25/2021 Pathologic Stage   Stage IA, grade 1 25% MI Neg SLNs No LVI     Interval History: Reports overall doing well.  Continuing to have vulvar irritation as well as pruritus.  Denies any urinary symptoms.  Has struggled the last couple days with constipation, previously was having normal bowel function.  Denies any bleeding or discharge.  Continues to have stress urinary incontinence.  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 , tend to happen with stress, no loc is aware of surrounding's,  controlled with topamax  (02-24-2021  pt stated last one approx 6 months ago)   GERD (gastroesophageal reflux disease)    Herpes genitalis    Hiatal hernia 2007   History of COVID-19 04/2019   per pt mild symptoms that resolved   History of hemorrhagic cerebrovascular  accident (CVA) with residual deficit 1996   neurologist-- dr c. 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    Lichen sclerosus    labia   Osteopenia    Personal history of PE (pulmonary embolism) 1975   per pt post op breast augmentation/ BTL one week,  stated completed anticoagation, and none before or after   PMB (postmenopausal bleeding)    gyn-- dr Landry Mellow   Pneumonia    Stroke [facility])    Hemorragic 1996   Thickened endometrium    Vitamin D deficiency    Wears glasses  Past Surgical History:  Procedure Laterality Date   APPENDECTOMY  1955   AUGMENTATION MAMMAPLASTY Bilateral 0355   silicone   BROW LIFT Bilateral 06/07/2019   Procedure: BLEPHAROPLASTY UPPER EYELID WITH EXCESS SKIN;  Surgeon: Karle Starch, MD;  Location: [place];  Service: Ophthalmology;  Laterality: Bilateral;   CATARACT EXTRACTION W/ INTRAOCULAR LENS IMPLANT Bilateral    left 06/ 2006;  right 08/ 2009   DILATATION & CURETTAGE/HYSTEROSCOPY WITH MYOSURE N/A 03/02/2021   Procedure: DILATATION & CURETTAGE/HYSTEROSCOPY WITH MYOSURE;  Surgeon: Christophe Louis, MD;  Location: [facility];  Service: Gynecology;  Laterality: N/A;   DILATION AND CURETTAGE OF UTERUS  1972   MELANOMA EXCISION  07/1997   left shoulder   ROBOTIC ASSISTED TOTAL HYSTERECTOMY WITH BILATERAL SALPINGO OOPHERECTOMY N/A 03/25/2021   Procedure: XI ROBOTIC ASSISTED TOTAL HYSTERECTOMY WITH BILATERAL SALPINGO OOPHORECTOMY;  Surgeon: Lafonda Mosses, MD;  Location: [facility];  Service: Gynecology;  Laterality: N/A;   SENTINEL NODE BIOPSY N/A 03/25/2021    Procedure: SENTINEL NODE BIOPSY;  Surgeon: Lafonda Mosses, MD;  Location: [facility];  Service: Gynecology;  Laterality: N/A;   thrombosed hemorrhoid  surgery     Stella   right leg   VULVA /PERINEUM BIOPSY N/A 03/25/2021   Procedure: VULVAR BIOPSY;  Surgeon: Lafonda Mosses, MD;  Location: [facility];  Service: Gynecology;  Laterality: N/A;    Family History  Problem Relation Age of Onset   Heart attack Mother    Heart attack Father    Diabetes Father    Atrial fibrillation Father    Lymphoma Brother    Cancer Maternal Grandmother        esophageal   Seizures Neg Hx    Colon cancer Neg Hx    Breast cancer Neg Hx    Ovarian cancer Neg Hx    Endometrial cancer Neg Hx    Pancreatic cancer Neg Hx    Prostate cancer Neg Hx     Social History   Socioeconomic History   Marital status: Married    Spouse name: Not on file   Number of children: 3   Years of education: 13   Highest education level: Not on file  Occupational History   Occupation: retired  Tobacco Use   Smoking status: Former    Years: 20.00    Types: Cigarettes    Quit date: 09/26/1989    Years since quitting: 31.6   Smokeless tobacco: Never  Vaping Use   Vaping Use: Never used  Substance and Sexual Activity   Alcohol use: Yes    Alcohol/week: 14.0 standard drinks    Types: 14 Glasses of wine per week    Comment: per pt 2 wine daily   Drug use: Never   Sexual activity: Not Currently    Birth control/protection: Post-menopausal  Other Topics Concern   Not on file  Social History Narrative   Lives at home w/ her husband   Patient drinks 1-2 cups of caffeine daily.   Patient is right handed.   Social Determinants of Health   Financial Resource Strain: Not on file  Food Insecurity: Not on file  Transportation Needs: Not on file  Physical Activity: Not on file  Stress: Not on file  Social Connections: Not on file    Current  Medications:  Current Outpatient Medications:    clobetasol cream (TEMOVATE) 9.74 %, Apply 1 application  topically 2 (two) times a week., Disp: , Rfl:    docusate sodium (COLACE) 100 MG capsule, Take 100 mg by mouth daily as needed for mild constipation., Disp: , Rfl:    famotidine (PEPCID) 40 MG tablet, Take 40 mg by mouth 2 (two) times daily., Disp: , Rfl:    fexofenadine (ALLEGRA) 180 MG tablet, Take 180 mg by mouth every evening., Disp: , Rfl:    fluconazole (DIFLUCAN) 150 MG tablet, Take 1 tablet (150 mg total) by mouth daily., Disp: 1 tablet, Rfl: 0   ibuprofen (ADVIL) 800 MG tablet, Take 1 tablet (800 mg total) by mouth every 8 (eight) hours as needed for moderate pain or mild pain., Disp: 30 tablet, Rfl: 0   LORazepam (ATIVAN) 1 MG tablet, Take 1 mg by mouth at bedtime., Disp: , Rfl: 1   nystatin-triamcinolone ointment (MYCOLOG), Apply 1 application topically 2 (two) times daily. Apply to inside of upper buttocks, Disp: 30 g, Rfl: 1   polyethylene glycol (MIRALAX / GLYCOLAX) 17 g packet, Take 17 g by mouth daily., Disp: , Rfl:    Probiotic Product (PROBIOTIC DAILY PO), Take by mouth., Disp: , Rfl:    topiramate (TOPAMAX) 100 MG tablet, TAKE 1 TABLET BY MOUTH IN THE MORNING AND 2 IN THE EVENING, Disp: 270 tablet, Rfl: 1   valACYclovir (VALTREX) 500 MG tablet, Take 500 mg by mouth every evening., Disp: , Rfl:    enoxaparin (LOVENOX) 40 MG/0.4ML injection, Inject 0.4 mLs (40 mg total) into the skin daily for 28 days., Disp: 11.2 mL, Rfl: 0   psyllium (METAMUCIL) 58.6 % packet, Take 1 packet by mouth daily as needed (constipation). (Patient not taking: Reported on 04/15/2021), Disp: , Rfl:    traMADol (ULTRAM) 50 MG tablet, Take 1 tablet (50 mg total) by mouth every 6 (six) hours as needed for up to 10 doses for severe pain. For AFTER surgery, do not take and drive (Patient not taking: Reported on 04/15/2021), Disp: 10 tablet, Rfl: 0  Review of Systems: + constipation Denies appetite  changes, fevers, chills, fatigue, unexplained weight changes. Denies hearing loss, neck lumps or masses, mouth sores, ringing in ears or voice changes. Denies cough or wheezing.  Denies shortness of breath. Denies chest pain or palpitations. Denies leg swelling. Denies abdominal distention, pain, blood in stools, diarrhea, nausea, vomiting, or early satiety. Denies pain with intercourse, dysuria, frequency, hematuria or incontinence. Denies hot flashes, pelvic pain, vaginal bleeding or vaginal discharge.   Denies joint pain, back pain or muscle pain/cramps. Denies itching, rash, or wounds. Denies dizziness, headaches, numbness or seizures. Denies swollen lymph nodes or glands, denies easy bruising or bleeding. Denies anxiety, depression, confusion, or decreased concentration.  Physical Exam: BP (!) 143/58 (BP Location: Left Arm, Patient Position: Sitting)    Pulse (!) 56    Temp (!) 97.5 F (36.4 C) (Oral)    Resp 18    Ht 5' 2.99" (1.6 m)    Wt 165 lb 6.4 oz (75 kg)    SpO2 100%    BMI 29.31 kg/m  General: Alert, oriented, no acute distress. HEENT: Normocephalic, atraumatic, sclera anicteric. Chest: Unlabored breathing on room air. Extremities: Grossly normal range of motion.  Warm, well perfused.  No edema bilaterally. Skin: Previous lesion in the buttock cleft, almost completely healed. GU: External female genitalia notable for significant erythema bilateral labia, loss of architecture.  Laboratory & Radiologic Studies: None new  Assessment & Plan: [name] is a 81 y.o. woman presenting with  vulvar symptoms, minimal to no improvement with multiple doses of Diflucan.  Patient continues to have symptoms despite yeast treatment.  Vulvar tissue is quite inflamed.  Given her history of lichen sclerosis, I recommend that we try course of treatment with topical steroid.  The patient has clobetasol from previous use.  She will start using this 3 times a week at night.  We will have a  phone visit in 6 weeks.  I have asked her to call me back in several weeks to let me know if she is noticed any difference.  She is also scheduled to see her GYN in February and will likely keep this visit for additional follow-up for her vulvar symptoms.  28 minutes of total time was spent for this patient encounter, including preparation, face-to-face counseling with the patient and coordination of care, and documentation of the encounter.  Jeral Pinch, MD  Division of Gynecologic Oncology  Department of Obstetrics and Gynecology  [facility]

## 2021-05-05 NOTE — Patient Instructions (Signed)
I will talk with you by phone in about 6 weeks.  Please use the clobetasol ointment 3 times a week at night.  Try to keep your bottom as dry as possible.  Call me with an update in 2 to 3 weeks to let me know how you are doing.

## 2021-05-13 ENCOUNTER — Other Ambulatory Visit: Payer: Self-pay | Admitting: Gynecologic Oncology

## 2021-05-13 DIAGNOSIS — Z85820 Personal history of malignant melanoma of skin: Secondary | ICD-10-CM | POA: Diagnosis not present

## 2021-05-13 DIAGNOSIS — C541 Malignant neoplasm of endometrium: Secondary | ICD-10-CM

## 2021-05-13 DIAGNOSIS — D2272 Melanocytic nevi of left lower limb, including hip: Secondary | ICD-10-CM | POA: Diagnosis not present

## 2021-05-13 DIAGNOSIS — D2261 Melanocytic nevi of right upper limb, including shoulder: Secondary | ICD-10-CM | POA: Diagnosis not present

## 2021-05-13 DIAGNOSIS — Z86718 Personal history of other venous thrombosis and embolism: Secondary | ICD-10-CM

## 2021-05-13 DIAGNOSIS — D2271 Melanocytic nevi of right lower limb, including hip: Secondary | ICD-10-CM | POA: Diagnosis not present

## 2021-05-19 DIAGNOSIS — M8580 Other specified disorders of bone density and structure, unspecified site: Secondary | ICD-10-CM | POA: Diagnosis not present

## 2021-05-19 DIAGNOSIS — Z1389 Encounter for screening for other disorder: Secondary | ICD-10-CM | POA: Diagnosis not present

## 2021-05-19 DIAGNOSIS — R945 Abnormal results of liver function studies: Secondary | ICD-10-CM | POA: Diagnosis not present

## 2021-05-19 DIAGNOSIS — C55 Malignant neoplasm of uterus, part unspecified: Secondary | ICD-10-CM | POA: Diagnosis not present

## 2021-05-19 DIAGNOSIS — G4700 Insomnia, unspecified: Secondary | ICD-10-CM | POA: Diagnosis not present

## 2021-05-19 DIAGNOSIS — G40209 Localization-related (focal) (partial) symptomatic epilepsy and epileptic syndromes with complex partial seizures, not intractable, without status epilepticus: Secondary | ICD-10-CM | POA: Diagnosis not present

## 2021-05-19 DIAGNOSIS — K219 Gastro-esophageal reflux disease without esophagitis: Secondary | ICD-10-CM | POA: Diagnosis not present

## 2021-05-19 DIAGNOSIS — Z0000 Encounter for general adult medical examination without abnormal findings: Secondary | ICD-10-CM | POA: Diagnosis not present

## 2021-05-19 DIAGNOSIS — R7989 Other specified abnormal findings of blood chemistry: Secondary | ICD-10-CM | POA: Diagnosis not present

## 2021-05-19 DIAGNOSIS — K589 Irritable bowel syndrome without diarrhea: Secondary | ICD-10-CM | POA: Diagnosis not present

## 2021-05-20 ENCOUNTER — Telehealth: Payer: Self-pay

## 2021-05-20 NOTE — Telephone Encounter (Signed)
Received call from Ms. Laabs this morning to update Dr. Berline Lopes on the clobetasol ointment. Pt states she is doing much better while using this ointment and is not feeling irritation. Pt informed that her message would be sent to Dr. Berline Lopes. Pt expresses much gratitude for all of Dr. Charisse March guidance and support from our office.

## 2021-05-25 DIAGNOSIS — Z8542 Personal history of malignant neoplasm of other parts of uterus: Secondary | ICD-10-CM | POA: Diagnosis not present

## 2021-05-25 DIAGNOSIS — N952 Postmenopausal atrophic vaginitis: Secondary | ICD-10-CM | POA: Diagnosis not present

## 2021-05-25 DIAGNOSIS — L900 Lichen sclerosus et atrophicus: Secondary | ICD-10-CM | POA: Diagnosis not present

## 2021-06-16 ENCOUNTER — Other Ambulatory Visit: Payer: Self-pay

## 2021-06-16 ENCOUNTER — Ambulatory Visit (INDEPENDENT_AMBULATORY_CARE_PROVIDER_SITE_OTHER): Payer: PPO | Admitting: Adult Health

## 2021-06-16 ENCOUNTER — Encounter: Payer: Self-pay | Admitting: Adult Health

## 2021-06-16 VITALS — BP 136/72 | HR 64 | Ht 63.0 in | Wt 163.6 lb

## 2021-06-16 DIAGNOSIS — R569 Unspecified convulsions: Secondary | ICD-10-CM | POA: Diagnosis not present

## 2021-06-16 DIAGNOSIS — G43109 Migraine with aura, not intractable, without status migrainosus: Secondary | ICD-10-CM | POA: Diagnosis not present

## 2021-06-16 NOTE — Patient Instructions (Signed)
Your Plan:  Continue Topamax If your symptoms worsen or you develop new symptoms please let us know.   Thank you for coming to see us at [facility]. I hope we have been able to provide you high quality care today.  You may receive a patient satisfaction survey over the next few weeks. We would appreciate your feedback and comments so that we may continue to improve ourselves and the health of our patients.  

## 2021-06-16 NOTE — Progress Notes (Signed)
PATIENT: [name] DOB: 11-07-1940  REASON FOR VISIT: follow up HISTORY FROM: patient  HISTORY OF PRESENT ILLNESS: Today 06/16/21:  [name] is a 81 year old female with a history of left hemorrhagic stroke and subsequent seizures.  She returns today for follow-up.  She is currently on Topamax 100 mg in the a.m. and 200 mg in the p.m. at the last visit it was felt that some of her events that she was describing could be an ocular migraine however the patient did not want to change/add anything to her  treatment  plan. EEG has been normal.  Patient reports that since the last visit she has not had any episodes.  She has continued to do really well.  Returns today for follow-up.  12/15/20: [name] is a 81 year old female with a history of left hemorrhagic stroke and subsequent seizures.  She returns today for follow-up.  At the last visit the patient had had several events with the only symptom began black spots in the visual field bilaterally.  These events always occurred after a stressful event.  We discussed adding on Keppra however the patient asked to wait until this visit as the medication was expensive.  She has continued on Topamax 100 mg in the morning and 200 mg in the evening.  Since our last visit she has had 3 other events.  The only symptom that she has is black spots in visual fields in both eyes.  She denies any trouble speaking with these events.  She states that she is completely aware of her surroundings and can have a conversation with her husband when it is happening.  She states after about 10 minutes it resolves.  The patient is hesitant to try any new medication.  Reviewing her notes from [place] [facility] from 2015 and from Dr. Jannifer Franklin she was not reporting black spots but rather blurry vision.  Looking at our system and EEG has not been completed through our office-her last MRI of the brain was in 2013.  06/09/20: [name] is a 81 year old female with a history of left  hemorrhagic stroke and subsequent seizures.  She returns today for follow-up.  She continues on Topamax 100 mg in the morning and 200 mg in the evening.  At the last visit she was reporting black spots that  would appear in both eyes that lasted for several minutes.  She felt these events were different than her original events as she did not have trouble speaking.  She reports that the events that occurring with her eyes with the spots are very similar to what she had before.  She did see ophthalmology and her exam was relatively unremarkable with the exception of dry eyes and visual acuity.  She reports that she did not have any events from June to this past January.  She states in January she had just recovered from [facility] and she had the event that lasted for approximately 15 minutes.  The only symptoms was black spots in both eyes no additional symptoms.  All of her events seem to occur after a stressful event.  The patient reports that prior to coming to our office she had tried 5 or 6 other seizure medications.  She is unsure of the name of these medications.  11/27/19: [name] is a 81 year old female with a history of left hemorrhagic stroke and subsequent seizures.  She returns today for follow-up.  She remains on Topamax.  She states that she has had 3 events  since January.  She reports that these events were slightly different.  She states that they presented with black spots in front of her vision in both visual fields.  She states that this lasted approximately 10 minutes.  Denies these events being followed by headache.  During this time she did not have any trouble speaking.  No weakness in the arms or legs.  She states that this happened again in March and then again in April.  She is not had any subsequent events.  She states that she has been under a lot of stress over the last year.  Reports that her dad passed away and then her son unexpectedly passed.  She has a follow-up with ophthalmology  next month and plans to discuss the symptoms as well.  She denies any additional strokelike symptoms.  She returns today for an evaluation.  HISTORY 11/26/18:   [name] is a 81 year old female with a history of left hemorrhagic stroke in 1996 and subsequent seizures.  She returns today for follow-up.  She remains on Topamax taking 100 mg in the morning and 200 mg in the evening.  She reports that she has had 2 events over the last year.  The first event was December 24, 2017.  She states that it started off with a bad headache and then she could not remember names.  It lasted for approximately 1 hour.  She does note that stressful events typically caused her episodes.  She states 1 week prior she was bitten by her dog and had to go to her PCP and was put on antibiotics.  She states the second event was December 18 she states that she [place] her [place] [facility].  She states that she developed aphasia could not talk or understand what people are saying for approximately 30 minutes.  She states that 1 week prior her best friend had passed away.  Fortunately she is not had any episodes this year.  She states that she typically lets her husband drive.  If she does drive her husband is typically in the car with her.  She is able to complete all ADLs independently.  She returns today for follow-up.  REVIEW OF SYSTEMS: Out of a complete 14 system review of symptoms, the patient complains only of the following symptoms, and all other reviewed systems are negative.  See HPI  ALLERGIES: Allergies  Allergen Reactions   Citalopram     Sleepy, dull feeling   Augmentin [Amoxicillin-Pot Clavulanate] Diarrhea    severe   Bacitracin Other (See Comments)   Doxycycline Diarrhea   Doxycycline Monohydrate Diarrhea   Moxifloxacin     Redness, non-healing (eye)   Moxifloxacin Hcl     Other reaction(s): redness (eyes)   Latex Rash    Bandaids/tape Other reaction(s): redness   Septra  [Sulfamethoxazole-Trimethoprim] Rash   Tape Rash   Zithromax [Azithromycin] Rash    HOME MEDICATIONS: Outpatient Medications Prior to Visit  Medication Sig Dispense Refill   clobetasol cream (TEMOVATE) 7.62 % Apply 1 application topically 2 (two) times a week.     docusate sodium (COLACE) 100 MG capsule Take 100 mg by mouth daily as needed for mild constipation.     enoxaparin (LOVENOX) 40 MG/0.4ML injection Inject 0.4 mLs (40 mg total) into the skin daily for 28 days. 11.2 mL 0   famotidine (PEPCID) 40 MG tablet Take 40 mg by mouth 2 (two) times daily.     fexofenadine (ALLEGRA) 180 MG  tablet Take 180 mg by mouth every evening.     fluconazole (DIFLUCAN) 150 MG tablet Take 1 tablet (150 mg total) by mouth daily. 1 tablet 0   ibuprofen (ADVIL) 800 MG tablet Take 1 tablet (800 mg total) by mouth every 8 (eight) hours as needed for moderate pain or mild pain. 30 tablet 0   LORazepam (ATIVAN) 1 MG tablet Take 1 mg by mouth at bedtime.  1   nystatin-triamcinolone ointment (MYCOLOG) Apply 1 application topically 2 (two) times daily. Apply to inside of upper buttocks 30 g 1   polyethylene glycol (MIRALAX / GLYCOLAX) 17 g packet Take 17 g by mouth daily.     Probiotic Product (PROBIOTIC DAILY PO) Take by mouth.     psyllium (METAMUCIL) 58.6 % packet Take 1 packet by mouth daily as needed (constipation). (Patient not taking: Reported on 04/15/2021)     topiramate (TOPAMAX) 100 MG tablet TAKE 1 TABLET BY MOUTH IN THE MORNING AND 2 IN THE EVENING 270 tablet 1   traMADol (ULTRAM) 50 MG tablet Take 1 tablet (50 mg total) by mouth every 6 (six) hours as needed for up to 10 doses for severe pain. For AFTER surgery, do not take and drive (Patient not taking: Reported on 04/15/2021) 10 tablet 0   valACYclovir (VALTREX) 500 MG tablet Take 500 mg by mouth every evening.     No facility-administered medications prior to visit.    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 , tend to happen with stress, no loc is aware of surrounding's,  controlled with topamax  (02-24-2021  pt stated last one approx 6 months ago)   GERD (gastroesophageal reflux disease)    Herpes genitalis    Hiatal hernia 2007   History of COVID-19 04/2019   per pt mild symptoms that resolved   History of hemorrhagic cerebrovascular accident (CVA) with residual deficit 1996   neurologist-- dr c. 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    Lichen sclerosus    labia   Osteopenia    Personal history of PE (pulmonary embolism) 1975   per pt post op breast augmentation/ BTL one week,  stated completed anticoagation, and none before or after   PMB (postmenopausal bleeding)    gyn-- dr Landry Mellow   Pneumonia    Stroke [facility])    Hemorragic 1996   Thickened endometrium    Vitamin D deficiency    Wears glasses     PAST SURGICAL HISTORY: Past Surgical History:  Procedure Laterality Date   APPENDECTOMY  1955   AUGMENTATION MAMMAPLASTY Bilateral 1761   silicone   BROW LIFT Bilateral 06/07/2019   Procedure: BLEPHAROPLASTY UPPER EYELID WITH EXCESS SKIN;  Surgeon: Karle Starch, MD;  Location: [facility];  Service: Ophthalmology;  Laterality: Bilateral;  CATARACT EXTRACTION W/ INTRAOCULAR LENS IMPLANT Bilateral    left 06/ 2006;  right 08/ 2009   DILATATION &  CURETTAGE/HYSTEROSCOPY WITH MYOSURE N/A 03/02/2021   Procedure: DILATATION & CURETTAGE/HYSTEROSCOPY WITH MYOSURE;  Surgeon: Christophe Louis, MD;  Location: [facility];  Service: Gynecology;  Laterality: N/A;   DILATION AND CURETTAGE OF UTERUS  1972   MELANOMA EXCISION  07/1997   left shoulder   ROBOTIC ASSISTED TOTAL HYSTERECTOMY WITH BILATERAL SALPINGO OOPHERECTOMY N/A 03/25/2021   Procedure: XI ROBOTIC ASSISTED TOTAL HYSTERECTOMY WITH BILATERAL SALPINGO OOPHORECTOMY;  Surgeon: Lafonda Mosses, MD;  Location: [facility];  Service: Gynecology;  Laterality: N/A;   SENTINEL NODE BIOPSY N/A 03/25/2021   Procedure: SENTINEL NODE BIOPSY;  Surgeon: Lafonda Mosses, MD;  Location: [facility];  Service: Gynecology;  Laterality: N/A;   thrombosed hemorrhoid  surgery     [place]   right leg   VULVA /PERINEUM BIOPSY N/A 03/25/2021   Procedure: VULVAR BIOPSY;  Surgeon: Lafonda Mosses, MD;  Location: [facility];  Service: Gynecology;  Laterality: N/A;    FAMILY HISTORY: Family History  Problem Relation Age of Onset   Heart attack Mother    Heart attack Father    Diabetes Father    Atrial fibrillation Father    Lymphoma Brother    Cancer Maternal Grandmother        esophageal   Seizures Neg Hx    Colon cancer Neg Hx    Breast cancer Neg Hx    Ovarian cancer Neg Hx    Endometrial cancer Neg Hx    Pancreatic cancer Neg Hx    Prostate cancer Neg Hx     SOCIAL HISTORY: Social History   Socioeconomic History   Marital status: Married    Spouse name: Not on file   Number of children: 3   Years of education: 13   Highest education level: Not on file  Occupational History   Occupation: retired  Tobacco Use   Smoking status: Former    Years: 20.00    Types: Cigarettes    Quit date: 09/26/1989    Years since quitting: 31.7   Smokeless tobacco: Never  Vaping Use   Vaping Use: Never used  Substance and Sexual  Activity   Alcohol use: Yes    Alcohol/week: 14.0 standard drinks    Types: 14 Glasses of wine per week    Comment: per pt 2 wine daily   Drug use: Never   Sexual activity: Not Currently    Birth control/protection: Post-menopausal  Other Topics Concern   Not on file  Social History Narrative   Lives at home w/ her husband   Patient drinks 1-2 cups of caffeine daily.   Patient is right handed.   Social Determinants of Health   Financial Resource Strain: Not on file  Food Insecurity: Not on file  Transportation Needs: Not on file  Physical Activity: Not on file  Stress: Not on file  Social Connections: Not on file  Intimate Partner Violence: Not on file      PHYSICAL EXAM  Vitals:   06/16/21 1140  BP: 136/72  Pulse: 64  Weight: 163 lb 9.6 oz (74.2 kg)  Height: 5\' 3"  (1.6 m)    Body mass index is 28.98 kg/m.  Generalized: Well developed, in no acute distress   Neurological examination  Mentation: Alert oriented to time, place, history taking. Follows  all commands speech and language fluent Cranial nerve II-XII: Pupils were equal round reactive to light. Extraocular movements were full, visual field were full on confrontational test. Head turning and shoulder shrug  were normal and symmetric. Motor: The motor testing reveals 5 over 5 strength of all 4 extremities. Good symmetric motor tone is noted throughout.  Sensory: Sensory testing is intact to soft touch on all 4 extremities. No evidence of extinction is noted.  Coordination: Cerebellar testing reveals good finger-nose-finger and heel-to-shin bilaterally.  Gait and station: Gait is normal.  Reflexes: Deep tendon reflexes are symmetric and normal bilaterally.   DIAGNOSTIC DATA (LABS, IMAGING, TESTING) - I reviewed patient records, labs, notes, testing and imaging myself where available.  Lab Results  Component Value Date   WBC 5.7 03/16/2021   HGB 14.3 03/16/2021   HCT 42.8 03/16/2021   MCV 98.6 03/16/2021    PLT 192 03/16/2021      Component Value Date/Time   NA 131 (L) 03/16/2021 1138   K 4.5 03/16/2021 1138   CL 103 03/16/2021 1138   CO2 23 03/16/2021 1138   GLUCOSE 102 (H) 03/16/2021 1138   BUN 15 03/16/2021 1138   CREATININE 0.78 03/16/2021 1138   CALCIUM 9.2 03/16/2021 1138   PROT 7.0 03/16/2021 1138   ALBUMIN 4.1 03/16/2021 1138   AST 204 (H) 03/16/2021 1138   ALT 628 (H) 03/16/2021 1138   ALKPHOS 397 (H) 03/16/2021 1138   BILITOT 0.6 03/16/2021 1138   GFRNONAA >60 03/16/2021 1138   GFRAA 109 09/01/2006 1405   Lab Results  Component Value Date   CHOL 235 (HH) 09/01/2006   HDL 75.5 09/01/2006   LDLDIRECT 135.4 09/01/2006   TRIG 67 09/01/2006   CHOLHDL 3.1 CALC 09/01/2006   Lab Results  Component Value Date   HGBA1C 5.4 09/01/2006   Lab Results  Component Value Date   VITAMINB12 208 (L) 09/05/2006   No results found for: TSH    ASSESSMENT AND PLAN 81 y.o. 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

## 2021-06-17 ENCOUNTER — Encounter: Payer: Self-pay | Admitting: Gynecologic Oncology

## 2021-06-17 ENCOUNTER — Inpatient Hospital Stay: Payer: PPO | Attending: Gynecologic Oncology | Admitting: Gynecologic Oncology

## 2021-06-17 DIAGNOSIS — N9089 Other specified noninflammatory disorders of vulva and perineum: Secondary | ICD-10-CM

## 2021-06-17 DIAGNOSIS — C541 Malignant neoplasm of endometrium: Secondary | ICD-10-CM

## 2021-06-17 NOTE — Progress Notes (Signed)
Gynecologic Oncology Telehealth Consult Note: Gyn-Onc  I connected with [name] on 06/17/21 at  4:00 PM EST by telephone and verified that I am speaking with the correct person using two identifiers.  I discussed the limitations, risks, security and privacy concerns of performing an evaluation and management service by telemedicine and the availability of in-person appointments. I also discussed with the patient that there may be a patient responsible charge related to this service. The patient expressed understanding and agreed to proceed.  Other persons participating in the visit and their role in the encounter: None.  Patient's location: Home Provider's location: [facility]  Reason for Visit: Follow-up vulvar symptoms  Treatment History: Oncology History Overview Note  MMR IHC intact   Endometrial cancer [facility])   Initial Diagnosis   Endometrial cancer ([place])   03/02/2021 Initial Biopsy   hysteroscopy with endometrial sampling.  Final pathology from this showed endometrial adenocarcinoma, endometrioid type, FIGO grade 1.  Findings at the time of surgery were endometrial polyps, vaginal and vulvar atrophy.   03/25/2021 Surgery   TRH/BSO, SLN biopsy  Findings: On EUA, small mobile uterus.  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  On intra-abdominal entry, normal upper abdominal survey.  Omentum adherent to the anterior abdominal wall along the midline just inferior to the umbilicus, also along the right abdominal sidewall.  Otherwise omentum normal-appearing.  Normal-appearing small and large bowel.  Uterus 6 cm and normal appearing.  Bilateral adnexa atrophic and normal-appearing.  No obvious adenopathy.  Mapping successful on the right to a presacral node, on the left to a deep obturator and external iliac sentinel lymph node.  Minimal adhesions of the bladder to the  cervix.  No intra-abdominal or pelvic evidence of disease.   03/25/2021 Pathology Results   A. LYMPH NODE, SENTINEL, LEFT OBTURATOR, EXCISION:  - One lymph node, negative for malignancy (0/1).   B. LYMPH NODE, SENTINEL, LEFT EXTERNAL ILIAC, EXCISION:  - One lymph node, negative for malignancy (0/1).   C. LYMPH NODE, SENTINEL, RIGHT PRESACRAL, EXCISION:  - One lymph node, negative for malignancy (0/1).   D. 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       - Background endometrioid intraepithelial neoplasia (EIN).       - See Oncology Table.  - Myometrium:       - Superficial involvement by endometrioid adenocarcinoma, less than  50% myometrial invasion.  - Uterine cervix:       - Benign transformation zone.       - Negative for squamous intraepithelial lesion and malignancy.  - Fallopian tubes:       - Benign paratubal cysts.       - Otherwise no significant histopathologic change.  - Ovaries:       - Benign physiologic changes.   E. VULVA, BIOPSIES:  - Benign atrophic squamous mucosa.  - Negative for squamous intraepithelial lesion and malignancy.   ONCOLOGY TABLE:   UTERUS, CARCINOMA OR CARCINOSARCOMA: Resection   Procedure: Total hysterectomy and bilateral salpingo-oophorectomy  Histologic Type: Endometrioid adenocarcinoma, NOS  Histologic Grade: Low-grade (encompassing FIGO 1 and 2)  Myometrial Invasion:       Depth of Myometrial Invasion (mm): 4 mm       Myometrial Thickness (mm): 16 mm       Percentage of Myometrial  Invasion: 25%  Uterine Serosa Involvement: Not identified  Cervical stromal Involvement: Not identified  Extent of involvement of other tissue/organs: Not identified  Peritoneal/Ascitic Fluid: Not submitted/unknown  Lymphovascular Invasion: Not identified   Regional Lymph Nodes: Regional lymph nodes present  All regional lymph nodes negative for tumor  cells       Pelvic Lymph Nodes Examined:            3 Sentinel                            0 non-sentinel                            3 total       Pelvic Lymph Nodes with Metastasis: 0        Para-aortic Lymph Nodes Examined:  0 Sentinel                            0 non-sentinel                            0 total       Para-aortic Lymph Nodes with Metastasis: 0   Distant Metastasis:       Distant Site(s) Involved: Not applicable  Pathologic Stage Classification (pTNM, AJCC 8th Edition): pT1a, pN0  Ancillary Studies: MMR/MSI testing will be ordered  Representative Tumor Block: D2  Comment(s): Pancytokeratin was performed on the lymph nodes and is  negative   Reason for Addendum #1:  Report Clarification  Reason for Addendum #2:  DNA Mismatch Repair IHC Results   Clinical History: Endometrial cancer ([facility])      FINAL MICROSCOPIC DIAGNOSIS:   A. LYMPH NODE, SENTINEL, LEFT OBTURATOR, EXCISION:  - One lymph node, negative for malignancy (0/1).   B. LYMPH NODE, SENTINEL, LEFT EXTERNAL ILIAC, EXCISION:  - One lymph node, negative for malignancy (0/1).   C. LYMPH NODE, SENTINEL, RIGHT PRESACRAL, EXCISION:  - One lymph node, negative for malignancy (0/1).   D. 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       - Background endometrioid intraepithelial neoplasia (EIN).       - See Oncology Table.  - Myometrium:       - Superficial involvement by endometrioid adenocarcinoma, less than  50% myometrial invasion.  - Uterine cervix:       - Benign transformation zone.       - Negative for squamous intraepithelial lesion and malignancy.  - Fallopian tubes:       - Benign paratubal cysts.       - Otherwise no significant histopathologic change.  - Ovaries:       - Benign physiologic changes.   E. VULVA, BIOPSIES:  - Benign atrophic squamous mucosa.  - Negative for squamous  intraepithelial lesion and malignancy.   ONCOLOGY TABLE:   UTERUS, CARCINOMA OR CARCINOSARCOMA: Resection   Procedure: Total hysterectomy and bilateral salpingo-oophorectomy  Histologic Type: Endometrioid adenocarcinoma, NOS  Histologic Grade: Low-grade (encompassing FIGO 1 and 2)  Myometrial Invasion:       Depth of Myometrial Invasion (mm): 4 mm       Myometrial Thickness (mm): 16 mm       Percentage of Myometrial Invasion: 25%  Uterine Serosa Involvement: Not identified  Cervical stromal Involvement: Not identified  Extent of involvement of other tissue/organs: Not identified  Peritoneal/Ascitic Fluid: Not submitted/unknown  Lymphovascular Invasion: Not identified   Regional Lymph Nodes: Regional lymph nodes present  All regional lymph nodes negative for tumor cells       Pelvic Lymph Nodes Examined:            3 Sentinel                            0 non-sentinel                            3 total       Pelvic Lymph Nodes with Metastasis: 0        Para-aortic Lymph Nodes Examined:  0 Sentinel                            0 non-sentinel                            0 total       Para-aortic Lymph Nodes with Metastasis: 0   Distant Metastasis:       Distant Site(s) Involved: Not applicable  Pathologic Stage Classification (pTNM, AJCC 8th Edition): pT1a, pN0  Ancillary Studies: MMR/MSI testing will be ordered  Representative Tumor Block: D2  Comment(s): Pancytokeratin was performed on the lymph nodes and is  negative   ([id number])    GROSS DESCRIPTION:   Specimen A: Received fresh is a 2.8 x 1.2 x 0.3 cm yellow-pink soft to  rubbery nodule which is sectioned and entirely submitted in 1 block.   Specimen B: Received fresh is a 1.5 x 0.4 x 0.3 cm yellow-pink soft to  rubbery nodule which is sectioned and entirely submitted 1 block.   Specimen C: Received fresh is a 0.4 cm yellow-pink soft to rubbery  nodule which is bisected and submitted 1 block.   Specimen D: Uterus  including cervix and attached bilateral fallopian  tubes and ovaries, received fresh.  Specimen integrity: Intact  Size and shape: The uterine body is symmetrical, 5 x 4.4 x 3.7 cm.  Weight: 54 g without adnexa  Serosa: Pink-purple, smooth  Cervix: 2.6 cm in length, 2.5 cm in diameter, has a pink-white to  hyperemic smooth, focally blue stained ectocervix, and smooth  endocervix.  Endometrium: The endometrial cavity is 3 x 2 cm, and both anterior and  posterior surfaces have tan-pink to dark red smooth, flattened lining.  A discrete mass is not identified.  Myometrium: The tissue just beneath the endometrium in the upper half is  tan-white, indurated, nodular, and is up to 0.7 cm thick in an area of  myometrium 1.5 cm thick.  Remaining outer half of myometrium is tan-pink  to hyperemic, smooth.  There are no discrete intramural nodules.  Right adnexa: The right fallopian tube there is a 4.6 cm in length and  up to 0.9 cm in diameter fimbriated segment, which has a pink-purple  smooth serosa.  On sectioning, the lumen is dilated up to 0.8 cm and  filled with dark red fluid.  The right ovary is 1.8 x 1.1 x 1 cm, has a  pale yellow-white to dark purple smooth surface and unremarkable cut  surfaces.  Left adnexa: The portion  of left tube is a distal 2.1 cm in length and  up to 0.4 cm in diameter distal fimbriated segment which has a  pink-purple smooth serosa and unremarkable cut surfaces.  Proximal half  is surgically absent.  The left ovary is 1.3 x 1.1 x 0.8 cm, has a  pink-white smooth surface and unremarkable cut surface.  Between the  left ovary and portion of left tube is a 2.1 cm smooth clear  fluid-filled cyst.  Block Summary:  Blocks 1-5 = full-thickness endomyometrium, anterior  Block 6-10 = full-thickness endomyometrium, posterior  Block 11 = cervix  Block 12 = sections of right tube and ovary  Block 13 = fimbriated end of right tube, bisected lengthwise  Block 14 =  sections of left tube and ovary and intervening cyst  Block 15 = fimbriated end of left tube, bisected lengthwise   Specimen E: Received fresh are 2 minute pieces of gray-white to dark red  soft tissue/material, in toto 1 block.   SW 03/26/2021     Final Diagnosis performed by Allena Napoleon, MD.   Electronically signed  04/01/2021  Technical component performed at [facility], [place]  [address] [place], [place] [address].   Professional component performed at [facility].  [address] [place], [place] [address]  Immunohistochemistry  Technical component (if applicable) was performed at [facility]. [address], [place], [place], [place]  [address].  IMMUNOHISTOCHEMISTRY DISCLAIMER (if applicable): Some of these  immunohistochemical stains may have been developed and the performance  characteristics determine by [facility]. Some may not have  been cleared or approved by the U.S. Food and Drug Administration. The  FDA has determined that such clearance or approval is not necessary.  This test is used for clinical purposes. It should not be regarded as  investigational or for research. This laboratory is certified under the  [employer] (CLIA-88) as  qualified to perform high complexity clinical laboratory testing.  The  controls stained appropriately.   ADDENDUM:  The endometrioid adenocarcinoma is low-grade and well  differentiated consistent with FIGO grade 1.    03/25/2021 Pathologic Stage   Stage IA, grade 1 25% MI Neg SLNs No LVI     Interval History: Patient reports significant improvement using clobetasol.  She has subsequently seen her OB/GYN as well as her dermatologist since her last visit with me.  She is using the clobetasol now twice a week to decrease skin symptoms related to topical steroid use.  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 , tend to happen with stress, no loc is aware of surrounding's,  controlled with topamax  (02-24-2021  pt stated last one approx 6 months ago)   GERD (gastroesophageal reflux disease)    Herpes genitalis    Hiatal hernia 2007   History of COVID-19 04/2019   per pt mild symptoms that resolved   History of hemorrhagic cerebrovascular accident (CVA) with residual deficit 1996   neurologist-- dr c. 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    Lichen sclerosus    labia   Osteopenia    Personal history of PE (pulmonary embolism) 1975   per pt post op breast augmentation/ BTL one week,  stated completed anticoagation, and none before or after   PMB (postmenopausal bleeding)    gyn-- dr Landry Mellow   Pneumonia    Stroke [facility])    Hemorragic 1996   Thickened endometrium    Vitamin D deficiency    Wears glasses     Past Surgical History:  Procedure Laterality Date   APPENDECTOMY  1955   AUGMENTATION MAMMAPLASTY Bilateral 6568   silicone   BROW LIFT Bilateral 06/07/2019   Procedure: BLEPHAROPLASTY UPPER EYELID WITH EXCESS SKIN;  Surgeon: Karle Starch, MD;  Location: [place];  Service: Ophthalmology;  Laterality: Bilateral;   CATARACT EXTRACTION W/ INTRAOCULAR LENS IMPLANT Bilateral    left 06/ 2006;  right 08/ 2009    DILATATION & CURETTAGE/HYSTEROSCOPY WITH MYOSURE N/A 03/02/2021   Procedure: DILATATION & CURETTAGE/HYSTEROSCOPY WITH MYOSURE;  Surgeon: Christophe Louis, MD;  Location[facility];  Service: Gynecology;  Laterality: N/A;   DILATION AND CURETTAGE OF UTERUS  1972   MELANOMA EXCISION  07/1997   left shoulder   ROBOTIC ASSISTED TOTAL HYSTERECTOMY WITH BILATERAL SALPINGO OOPHERECTOMY N/A 03/25/2021   Procedure: XI ROBOTIC ASSISTED TOTAL HYSTERECTOMY WITH BILATERAL SALPINGO OOPHORECTOMY;  Surgeon: Lafonda Mosses, MD;  Location: [facility];  Service: Gynecology;  Laterality: N/A;   SENTINEL NODE BIOPSY N/A 03/25/2021   Procedure: SENTINEL NODE BIOPSY;  Surgeon: Lafonda Mosses, MD;  Location: [facility];  Service: Gynecology;  Laterality: N/A;   thrombosed hemorrhoid  surgery     [place]   right leg   VULVA /PERINEUM BIOPSY N/A 03/25/2021   Procedure: VULVAR BIOPSY;  Surgeon: Lafonda Mosses, MD;  Location: [facility];  Service: Gynecology;  Laterality: N/A;    Family History  Problem Relation Age of Onset   Heart attack Mother    Heart attack Father    Diabetes Father    Atrial fibrillation Father    Lymphoma Brother    Cancer Maternal Grandmother        esophageal   Seizures Neg Hx    Colon cancer Neg Hx    Breast cancer Neg Hx    Ovarian cancer Neg Hx    Endometrial cancer Neg Hx    Pancreatic cancer Neg Hx    Prostate cancer Neg Hx     Social History   Socioeconomic History   Marital status: Married    Spouse name: Not on file   Number of children: 3   Years of education: 13   Highest education level: Not on file  Occupational History   Occupation: retired  Tobacco Use   Smoking status: Former    Years: 20.00    Types: Cigarettes    Quit date: 09/26/1989    Years since quitting: 31.7   Smokeless tobacco: Never  Vaping Use   Vaping Use: Never used  Substance and Sexual Activity   Alcohol  use: Yes    Alcohol/week: 7.0 standard drinks  Types: 7 Glasses of wine per week   Drug use: Never   Sexual activity: Not Currently    Birth control/protection: Post-menopausal  Other Topics Concern   Not on file  Social History Narrative   Lives at home w/ her husband   Patient drinks 1-2 cups of caffeine daily.   Patient is right handed.   Social Determinants of Health   Financial Resource Strain: Not on file  Food Insecurity: Not on file  Transportation Needs: Not on file  Physical Activity: Not on file  Stress: Not on file  Social Connections: Not on file    Current Medications:  Current Outpatient Medications:    clobetasol cream (TEMOVATE) 4.00 %, Apply 1 application topically 2 (two) times a week., Disp: , Rfl:    docusate sodium (COLACE) 100 MG capsule, Take 100 mg by mouth daily as needed for mild constipation., Disp: , Rfl:    enoxaparin (LOVENOX) 40 MG/0.4ML injection, Inject 0.4 mLs (40 mg total) into the skin daily for 28 days., Disp: 11.2 mL, Rfl: 0   famotidine (PEPCID) 40 MG tablet, Take 40 mg by mouth 2 (two) times daily., Disp: , Rfl:    fexofenadine (ALLEGRA) 180 MG tablet, Take 180 mg by mouth every evening., Disp: , Rfl:    fluconazole (DIFLUCAN) 150 MG tablet, Take 1 tablet (150 mg total) by mouth daily., Disp: 1 tablet, Rfl: 0   ibuprofen (ADVIL) 800 MG tablet, Take 1 tablet (800 mg total) by mouth every 8 (eight) hours as needed for moderate pain or mild pain., Disp: 30 tablet, Rfl: 0   LORazepam (ATIVAN) 1 MG tablet, Take 1 mg by mouth at bedtime., Disp: , Rfl: 1   nystatin-triamcinolone ointment (MYCOLOG), Apply 1 application topically 2 (two) times daily. Apply to inside of upper buttocks, Disp: 30 g, Rfl: 1   polyethylene glycol (MIRALAX / GLYCOLAX) 17 g packet, Take 17 g by mouth daily., Disp: , Rfl:    Probiotic Product (PROBIOTIC DAILY PO), Take by mouth., Disp: , Rfl:    psyllium (METAMUCIL) 58.6 % packet, Take 1 packet by mouth every other day.,  Disp: , Rfl:    senna-docusate (SENOKOT-S) 8.6-50 MG tablet, Take 1 tablet by mouth daily., Disp: , Rfl:    topiramate (TOPAMAX) 100 MG tablet, TAKE 1 TABLET BY MOUTH IN THE MORNING AND 2 IN THE EVENING, Disp: 270 tablet, Rfl: 1   traMADol (ULTRAM) 50 MG tablet, Take 1 tablet (50 mg total) by mouth every 6 (six) hours as needed for up to 10 doses for severe pain. For AFTER surgery, do not take and drive, Disp: 10 tablet, Rfl: 0   valACYclovir (VALTREX) 500 MG tablet, Take 500 mg by mouth every evening., Disp: , Rfl:   Review of Symptoms: Pertinent positives as per HPI.  Physical Exam: There were no vitals taken for this visit. Deferred given limitations of phone visit.  Laboratory & Radiologic Studies: None new  Assessment & Plan: [name] is a 81 y.o. 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  Patient is much improved after restarting topical steroid use for her vulvar symptoms.  She saw her OB/GYN within the last month, and plan is for her to have follow-up there in August.  With regard to her endometrial cancer, her plan had been to alternate visits every 6 months between her OB/GYN's office and my office.  I have asked her to call back sometime towards the end of  the year to get a visit scheduled with me in February 2024.  I discussed the assessment and treatment plan with the patient. The patient was provided with an opportunity to ask questions and all were answered. The patient agreed with the plan and demonstrated an understanding of the instructions.   The patient was advised to call back or see an in-person evaluation if the symptoms worsen or if the condition fails to improve as anticipated.   16 minutes of total time was spent for this patient encounter, including preparation, phone counseling with the patient and coordination of care, and documentation of the encounter.   Jeral Pinch, MD  Division  of Gynecologic Oncology  Department of Obstetrics and Gynecology  [facility]

## 2021-07-10 ENCOUNTER — Other Ambulatory Visit: Payer: Self-pay | Admitting: Adult Health

## 2021-08-12 DIAGNOSIS — R0989 Other specified symptoms and signs involving the circulatory and respiratory systems: Secondary | ICD-10-CM | POA: Diagnosis not present

## 2021-08-12 DIAGNOSIS — U071 COVID-19: Secondary | ICD-10-CM | POA: Diagnosis not present

## 2021-08-12 DIAGNOSIS — R059 Cough, unspecified: Secondary | ICD-10-CM | POA: Diagnosis not present

## 2021-08-12 DIAGNOSIS — J399 Disease of upper respiratory tract, unspecified: Secondary | ICD-10-CM | POA: Diagnosis not present

## 2021-08-25 DIAGNOSIS — K581 Irritable bowel syndrome with constipation: Secondary | ICD-10-CM | POA: Diagnosis not present

## 2021-08-25 DIAGNOSIS — U071 COVID-19: Secondary | ICD-10-CM | POA: Diagnosis not present

## 2021-09-02 DIAGNOSIS — N763 Subacute and chronic vulvitis: Secondary | ICD-10-CM | POA: Diagnosis not present

## 2021-09-03 ENCOUNTER — Telehealth: Payer: Self-pay | Admitting: *Deleted

## 2021-09-03 ENCOUNTER — Encounter: Payer: Self-pay | Admitting: Gynecologic Oncology

## 2021-09-03 NOTE — Telephone Encounter (Signed)
Patient called and stated "Dr Berline Lopes did my surgery back in December; and told me to call if I started having problems. I saw my dermatologist yesterday. I have a place on my right labia minora. It is red, inflamed, sore and painful. The doctor wanted to do a biopsy yesterday but I asked her to wait until I could talk with Dr Berline Lopes. Also that the biopsy would be painful and cause pain after; I going out of town next week for my birthday and didn't want to be in pain. So I wanted to wait until the following week. Does Dr Berline Lopes want to see me or that the dermatologist do the biopsy?"   Explained that Dr Berline Lopes is out of the office until Monday and the message would be given to her then. Explained that the office will call her back after it's reviewed with Dr Berline Lopes. Patient verbalized understanding.  Patient will try to send a picture through my chart

## 2021-09-06 NOTE — Telephone Encounter (Signed)
Following up with [name] this morning. Notified patient that per Dr. Berline Lopes, Yes the area needs to be biopsied. Dr. Berline Lopes does not feel this is related to her cancer. She recommends the patient have her dermatologist or gynecologist perform the biopsy. Patient verbalized understanding and states " Ok, I will call Dr. Kellie Moor and get the biopsy scheduled. I'll also send Dr. Berline Lopes the results."

## 2021-09-16 DIAGNOSIS — L309 Dermatitis, unspecified: Secondary | ICD-10-CM | POA: Diagnosis not present

## 2021-09-16 DIAGNOSIS — N762 Acute vulvitis: Secondary | ICD-10-CM | POA: Diagnosis not present

## 2021-10-06 DIAGNOSIS — E039 Hypothyroidism, unspecified: Secondary | ICD-10-CM | POA: Diagnosis not present

## 2021-10-06 DIAGNOSIS — H8112 Benign paroxysmal vertigo, left ear: Secondary | ICD-10-CM | POA: Diagnosis not present

## 2021-10-06 DIAGNOSIS — R42 Dizziness and giddiness: Secondary | ICD-10-CM | POA: Diagnosis not present

## 2021-10-06 DIAGNOSIS — H6121 Impacted cerumen, right ear: Secondary | ICD-10-CM | POA: Diagnosis not present

## 2021-10-14 DIAGNOSIS — H6121 Impacted cerumen, right ear: Secondary | ICD-10-CM | POA: Diagnosis not present

## 2021-10-20 DIAGNOSIS — M79661 Pain in right lower leg: Secondary | ICD-10-CM | POA: Diagnosis not present

## 2021-10-20 DIAGNOSIS — S80261D Insect bite (nonvenomous), right knee, subsequent encounter: Secondary | ICD-10-CM | POA: Diagnosis not present

## 2021-11-25 DIAGNOSIS — N952 Postmenopausal atrophic vaginitis: Secondary | ICD-10-CM | POA: Diagnosis not present

## 2021-11-25 DIAGNOSIS — L900 Lichen sclerosus et atrophicus: Secondary | ICD-10-CM | POA: Diagnosis not present

## 2021-11-25 DIAGNOSIS — Z8542 Personal history of malignant neoplasm of other parts of uterus: Secondary | ICD-10-CM | POA: Diagnosis not present

## 2021-12-01 DIAGNOSIS — M79604 Pain in right leg: Secondary | ICD-10-CM | POA: Diagnosis not present

## 2021-12-01 DIAGNOSIS — R053 Chronic cough: Secondary | ICD-10-CM | POA: Diagnosis not present

## 2021-12-01 DIAGNOSIS — M79605 Pain in left leg: Secondary | ICD-10-CM | POA: Diagnosis not present

## 2022-01-04 DIAGNOSIS — K5909 Other constipation: Secondary | ICD-10-CM | POA: Diagnosis not present

## 2022-01-06 ENCOUNTER — Other Ambulatory Visit: Payer: Self-pay | Admitting: *Deleted

## 2022-01-06 DIAGNOSIS — M79604 Pain in right leg: Secondary | ICD-10-CM

## 2022-01-14 ENCOUNTER — Ambulatory Visit (HOSPITAL_COMMUNITY)
Admission: RE | Admit: 2022-01-14 | Discharge: 2022-01-14 | Disposition: A | Discharge status: Home / Self Care | Payer: PPO | Source: Ambulatory Visit | Attending: Vascular Surgery | Admitting: Vascular Surgery

## 2022-01-14 ENCOUNTER — Encounter: Payer: Self-pay | Admitting: Vascular Surgery

## 2022-01-14 ENCOUNTER — Ambulatory Visit: Payer: PPO | Admitting: Vascular Surgery

## 2022-01-14 VITALS — BP 139/72 | HR 71 | Temp 97.7°F | Resp 20 | Ht 63.0 in | Wt 167.5 lb

## 2022-01-14 DIAGNOSIS — M79604 Pain in right leg: Secondary | ICD-10-CM

## 2022-01-14 DIAGNOSIS — I872 Venous insufficiency (chronic) (peripheral): Secondary | ICD-10-CM | POA: Diagnosis not present

## 2022-01-14 DIAGNOSIS — I8393 Asymptomatic varicose veins of bilateral lower extremities: Secondary | ICD-10-CM

## 2022-01-14 DIAGNOSIS — M79605 Pain in left leg: Secondary | ICD-10-CM | POA: Diagnosis not present

## 2022-01-14 NOTE — Progress Notes (Signed)
Office Note     CC: Bilateral lower extremity pain Requesting Provider:  Kirby Funk, MD  HPI: [name] is a 81 y.o. (1940/10/27) female who presents at the request of Kirby Funk, MD for evaluation of bilateral lower extremity swelling and heaviness.   On exam today, [name] was doing well, accompanied by her husband.  Over the last several years she has appreciated heaviness in bilateral lower extremities accompanied by pain and swelling.  Mother of 3, two of her sons are still living in the area.  She lives an active lifestyle with her husband, and loves to cook.  She is on her feet for multiple hours a day, and notes bilateral pain and swelling that is worse by days end.  Her legs are smallest in the morning.  She also appreciates some pain in bilateral lower extremities after ambulating for significant amount of time.  When she stops and sits, this improves.  He denies rest pain but her legs do wake her up from time to time.  Denies wounds or ulcerations on her feet.  She will has varicosities on her feet but denies itching, recent bleeding, ulceration.  History of previous right-sided stab phlebectomy after her third child.  No venous ablation surgeries.  No previous history of DVT.  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 Anne Hahn;  started complex partial seizures post 2 wks cva in 1996;  now focal seizures, bilateral black spots visual fields that resolve about 10 min. , tend to happen with stress, no loc is aware of surrounding's,  controlled with topamax  (02-24-2021  pt stated last one approx 6 months ago)   GERD (gastroesophageal reflux disease)    Herpes genitalis    Hiatal hernia 2007   History of COVID-19 04/2019   per pt mild symptoms that resolved   History of hemorrhagic cerebrovascular accident (CVA) with residual deficit 1996   neurologist-- dr c.  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    Lichen sclerosus    labia   Osteopenia    Personal history of PE (pulmonary embolism) 1975   per pt post op breast augmentation/ BTL one week,  stated completed anticoagation, and none before or after   PMB (postmenopausal bleeding)    gyn-- dr Richardson Dopp   Pneumonia    Stroke [facility])    Hemorragic 1996   Thickened endometrium    Vitamin D deficiency    Wears glasses     Past Surgical History:  Procedure Laterality Date   APPENDECTOMY  1955   AUGMENTATION MAMMAPLASTY Bilateral 1975   silicone   BROW LIFT Bilateral 06/07/2019   Procedure: BLEPHAROPLASTY UPPER EYELID WITH EXCESS SKIN;  Surgeon: Imagene Riches, MD;  Location: [facility];  Service: Ophthalmology;  Laterality: Bilateral;   CATARACT EXTRACTION W/ INTRAOCULAR LENS IMPLANT Bilateral    left 06/ 2006;  right 08/ 2009   DILATATION & CURETTAGE/HYSTEROSCOPY WITH MYOSURE N/A 03/02/2021   Procedure: DILATATION &  CURETTAGE/HYSTEROSCOPY WITH MYOSURE;  Surgeon: Gerald Leitz, MD;  Location: [facility];  Service: Gynecology;  Laterality: N/A;   DILATION AND CURETTAGE OF UTERUS  1972   MELANOMA EXCISION  07/1997   left shoulder   ROBOTIC ASSISTED TOTAL HYSTERECTOMY WITH BILATERAL SALPINGO OOPHERECTOMY N/A 03/25/2021   Procedure: XI ROBOTIC ASSISTED TOTAL HYSTERECTOMY WITH BILATERAL SALPINGO OOPHORECTOMY;  Surgeon: Carver Fila, MD;  Location: [facility];  Service: Gynecology;  Laterality: N/A;   SENTINEL NODE BIOPSY N/A 03/25/2021   Procedure: SENTINEL NODE BIOPSY;  Surgeon: Carver Fila,  MD;  Location: [facility];  Service: Gynecology;  Laterality: N/A;   thrombosed hemorrhoid  surgery     TONSILLECTOMY  1954   TUBAL LIGATION  1975   VARICOSE VEIN SURGERY  1969   right leg   VULVA /PERINEUM BIOPSY N/A 03/25/2021   Procedure: VULVAR BIOPSY;  Surgeon: Carver Fila, MD;  Location: [facility];  Service: Gynecology;  Laterality: N/A;    Social History   Socioeconomic History   Marital status: Married    Spouse name: Not on file   Number of children: 3   Years of education: 13   Highest education level: Not on file  Occupational History   Occupation: retired  Tobacco Use   Smoking status: Former    Years: 20.00    Types: Cigarettes    Quit date: 09/26/1989    Years since quitting: 32.3   Smokeless tobacco: Never  Vaping Use   Vaping Use: Never used  Substance and Sexual Activity   Alcohol use: Yes    Alcohol/week: 7.0 standard drinks of alcohol    Types: 7 Glasses of wine per week   Drug use: Never   Sexual activity: Not Currently    Birth control/protection: Post-menopausal  Other Topics Concern   Not on file  Social History Narrative   Lives at home w/ her husband   Patient drinks 1-2 cups of caffeine daily.   Patient is right handed.   Social Determinants of Health   Financial Resource Strain: Not on file  Food Insecurity: Not on file  Transportation Needs: Not on file  Physical Activity: Not on file  Stress: Not on file  Social Connections: Not on file  Intimate Partner Violence: Not on file   Family History  Problem Relation Age of Onset   Heart attack Mother    Heart attack Father    Diabetes Father    Atrial fibrillation Father    Lymphoma Brother    Cancer Maternal Grandmother        esophageal   Seizures Neg Hx    Colon cancer Neg Hx    Breast cancer Neg Hx    Ovarian cancer Neg Hx    Endometrial cancer Neg Hx    Pancreatic cancer Neg Hx    Prostate cancer Neg Hx     Current Outpatient Medications  Medication Sig Dispense Refill    clobetasol cream (TEMOVATE) 0.05 % Apply 1 application topically 2 (two) times a week.     docusate sodium (COLACE) 100 MG capsule Take 100 mg by mouth daily as needed for mild constipation.     famotidine (PEPCID) 40 MG tablet Take 40 mg by mouth 2 (two) times daily.     fexofenadine (ALLEGRA) 180 MG tablet Take 180 mg by mouth every evening.     fluconazole (DIFLUCAN) 150 MG tablet Take 1 tablet (150 mg total) by mouth daily. 1 tablet 0   ibuprofen (ADVIL)  800 MG tablet Take 1 tablet (800 mg total) by mouth every 8 (eight) hours as needed for moderate pain or mild pain. 30 tablet 0   LORazepam (ATIVAN) 1 MG tablet Take 1 mg by mouth at bedtime.  1   nystatin-triamcinolone ointment (MYCOLOG) Apply 1 application topically 2 (two) times daily. Apply to inside of upper buttocks 30 g 1   polyethylene glycol (MIRALAX / GLYCOLAX) 17 g packet Take 17 g by mouth daily.     Probiotic Product (PROBIOTIC DAILY PO) Take by mouth.     psyllium (METAMUCIL) 58.6 % packet Take 1 packet by mouth every other day.     senna-docusate (SENOKOT-S) 8.6-50 MG tablet Take 1 tablet by mouth daily.     topiramate (TOPAMAX) 100 MG tablet TAKE 1 TABLET BY MOUTH IN THE MORNING AND 2 IN THE EVENING 270 tablet 3   traMADol (ULTRAM) 50 MG tablet Take 1 tablet (50 mg total) by mouth every 6 (six) hours as needed for up to 10 doses for severe pain. For AFTER surgery, do not take and drive 10 tablet 0   valACYclovir (VALTREX) 500 MG tablet Take 500 mg by mouth every evening.     enoxaparin (LOVENOX) 40 MG/0.4ML injection Inject 0.4 mLs (40 mg total) into the skin daily for 28 days. 11.2 mL 0   No current facility-administered medications for this visit.    Allergies  Allergen Reactions   Citalopram     Sleepy, dull feeling   Augmentin [Amoxicillin-Pot Clavulanate] Diarrhea    severe   Bacitracin Other (See Comments)   Doxycycline Diarrhea   Doxycycline Monohydrate Diarrhea   Moxifloxacin     Redness, non-healing (eye)    Moxifloxacin Hcl     Other reaction(s): redness (eyes)   Latex Rash    Bandaids/tape Other reaction(s): redness   Septra [Sulfamethoxazole-Trimethoprim] Rash   Tape Rash   Zithromax [Azithromycin] Rash     REVIEW OF SYSTEMS:   [X]  denotes positive finding, [ ]  denotes negative finding Cardiac  Comments:  Chest pain or chest pressure:    Shortness of breath upon exertion:    Short of breath when lying flat:    Irregular heart rhythm:        Vascular    Pain in calf, thigh, or hip brought on by ambulation:    Pain in feet at night that wakes you up from your sleep:     Blood clot in your veins:    Leg swelling:         Pulmonary    Oxygen at home:    Productive cough:     Wheezing:         Neurologic    Sudden weakness in arms or legs:     Sudden numbness in arms or legs:     Sudden onset of difficulty speaking or slurred speech:    Temporary loss of vision in one eye:     Problems with dizziness:         Gastrointestinal    Blood in stool:     Vomited blood:         Genitourinary    Burning when urinating:     Blood in urine:        Psychiatric    Major depression:         Hematologic    Bleeding problems:    Problems with blood clotting too easily:        Skin    Rashes  or ulcers:        Constitutional    Fever or chills:      PHYSICAL EXAMINATION:  Vitals:   01/14/22 1341  BP: 139/72  Pulse: 71  Resp: 20  Temp: 97.7 F (36.5 C)  SpO2: 99%  Weight: 167 lb 8 oz (76 kg)  Height: 5\' 3"  (1.6 m)    General:  WDWN in NAD; vital signs documented above Gait: Not observed HENT: WNL, normocephalic Pulmonary: normal non-labored breathing , without Rales, rhonchi,  wheezing Cardiac: regular HR Abdomen: soft, NT, no masses Skin: without rashes Vascular Exam/Pulses:  Right Left  Radial 2+ (normal) 2+ (normal)  Ulnar    Femoral    Popliteal    DP 1+ (weak) 2+ (normal)  PT     Extremities: without ischemic changes, without Gangrene ,  without cellulitis; without open wounds;  Musculoskeletal: no muscle wasting or atrophy  Neurologic: A&O X 3;  No focal weakness or paresthesias are detected Psychiatric:  The pt has Normal affect.   Non-Invasive Vascular Imaging:   Summary:  Right:  THROMBUS  - No evidence of deep vein thrombosis seen in the right lower extremity,  from the common femoral through the popliteal veins.  - No evidence of superficial venous thrombosis in the right lower  extremity.   DEEP VEINS  - Deep vein reflux in the CFV, FV, and popliteal vein   SUPERFICIAL VEINS  - Superficial vein reflux in the SSV, SFJ and GSV.     *See table(s) above for measurements and observations.   Electronically signed by Gerarda Fraction on 01/14/2022 at 4:13:47 PM.    ASSESSMENT/PLAN:: 81 y.o. female presenting with bilateral lower extremity pain and heaviness that are worse by days and.  On physical exam, Raisa was very sensitive to light touch, complaining of bilateral leg pain to palpation.  She was noted to have palpable pulses bilaterally however interestingly, some of her symptoms seemed more consistent with claudication than chronic venous insufficiency.  Imaging was reviewed demonstrating venous insufficiency in the right lower extremity in both the superficial and deep systems.  Had a long conversation with Protivin regarding the above.  We discussed that she would benefit from trial of compression stockings.  She has worn them in the past intermittently, but states they are painful to put on, therefore she stopped.  She is willing to trial stockings again, and we ensured that she was fitted appropriately in clinic today.  Abrey will see one of my partners, who performed venous surgeries and her next visit in 3 months.  She has an ABI that is ordered to ensure she has no peripheral arterial disease.  At follow-up, the plan will be to discuss changes in symptoms and possible RFA.   Victorino Sparrow, MD [facility] [phone]

## 2022-01-18 ENCOUNTER — Other Ambulatory Visit: Payer: Self-pay

## 2022-01-18 DIAGNOSIS — M79604 Pain in right leg: Secondary | ICD-10-CM

## 2022-01-19 ENCOUNTER — Other Ambulatory Visit: Payer: Self-pay | Admitting: Obstetrics and Gynecology

## 2022-01-19 DIAGNOSIS — Z1231 Encounter for screening mammogram for malignant neoplasm of breast: Secondary | ICD-10-CM

## 2022-01-29 DIAGNOSIS — Z23 Encounter for immunization: Secondary | ICD-10-CM | POA: Diagnosis not present

## 2022-02-04 DIAGNOSIS — Z8673 Personal history of transient ischemic attack (TIA), and cerebral infarction without residual deficits: Secondary | ICD-10-CM | POA: Diagnosis not present

## 2022-02-04 DIAGNOSIS — R5382 Chronic fatigue, unspecified: Secondary | ICD-10-CM | POA: Diagnosis not present

## 2022-02-04 DIAGNOSIS — M791 Myalgia, unspecified site: Secondary | ICD-10-CM | POA: Diagnosis not present

## 2022-02-04 DIAGNOSIS — R946 Abnormal results of thyroid function studies: Secondary | ICD-10-CM | POA: Diagnosis not present

## 2022-02-04 DIAGNOSIS — G40209 Localization-related (focal) (partial) symptomatic epilepsy and epileptic syndromes with complex partial seizures, not intractable, without status epilepticus: Secondary | ICD-10-CM | POA: Diagnosis not present

## 2022-02-14 DIAGNOSIS — K921 Melena: Secondary | ICD-10-CM | POA: Diagnosis not present

## 2022-02-14 DIAGNOSIS — K219 Gastro-esophageal reflux disease without esophagitis: Secondary | ICD-10-CM | POA: Diagnosis not present

## 2022-02-14 DIAGNOSIS — K5900 Constipation, unspecified: Secondary | ICD-10-CM | POA: Diagnosis not present

## 2022-02-15 DIAGNOSIS — H04123 Dry eye syndrome of bilateral lacrimal glands: Secondary | ICD-10-CM | POA: Diagnosis not present

## 2022-02-15 DIAGNOSIS — Z0100 Encounter for examination of eyes and vision without abnormal findings: Secondary | ICD-10-CM | POA: Diagnosis not present

## 2022-02-15 DIAGNOSIS — Z961 Presence of intraocular lens: Secondary | ICD-10-CM | POA: Diagnosis not present

## 2022-02-18 ENCOUNTER — Ambulatory Visit
Admission: RE | Admit: 2022-02-18 | Discharge: 2022-02-18 | Disposition: A | Discharge status: Home / Self Care | Payer: PPO | Source: Ambulatory Visit | Attending: Obstetrics and Gynecology | Admitting: Obstetrics and Gynecology

## 2022-02-18 DIAGNOSIS — Z1231 Encounter for screening mammogram for malignant neoplasm of breast: Secondary | ICD-10-CM | POA: Diagnosis not present

## 2022-03-30 DIAGNOSIS — G4700 Insomnia, unspecified: Secondary | ICD-10-CM | POA: Diagnosis not present

## 2022-03-30 DIAGNOSIS — E039 Hypothyroidism, unspecified: Secondary | ICD-10-CM | POA: Diagnosis not present

## 2022-04-21 DIAGNOSIS — D485 Neoplasm of uncertain behavior of skin: Secondary | ICD-10-CM | POA: Diagnosis not present

## 2022-04-21 DIAGNOSIS — D2262 Melanocytic nevi of left upper limb, including shoulder: Secondary | ICD-10-CM | POA: Diagnosis not present

## 2022-04-21 DIAGNOSIS — D2272 Melanocytic nevi of left lower limb, including hip: Secondary | ICD-10-CM | POA: Diagnosis not present

## 2022-04-21 DIAGNOSIS — L309 Dermatitis, unspecified: Secondary | ICD-10-CM | POA: Diagnosis not present

## 2022-04-21 DIAGNOSIS — Z85820 Personal history of malignant melanoma of skin: Secondary | ICD-10-CM | POA: Diagnosis not present

## 2022-04-21 DIAGNOSIS — C44629 Squamous cell carcinoma of skin of left upper limb, including shoulder: Secondary | ICD-10-CM | POA: Diagnosis not present

## 2022-04-21 DIAGNOSIS — B372 Candidiasis of skin and nail: Secondary | ICD-10-CM | POA: Diagnosis not present

## 2022-04-27 ENCOUNTER — Ambulatory Visit: Payer: PPO | Admitting: Vascular Surgery

## 2022-04-27 ENCOUNTER — Encounter (HOSPITAL_COMMUNITY): Payer: PPO

## 2022-05-05 DIAGNOSIS — D2362 Other benign neoplasm of skin of left upper limb, including shoulder: Secondary | ICD-10-CM | POA: Diagnosis not present

## 2022-05-05 DIAGNOSIS — C44629 Squamous cell carcinoma of skin of left upper limb, including shoulder: Secondary | ICD-10-CM | POA: Diagnosis not present

## 2022-05-17 DIAGNOSIS — K5900 Constipation, unspecified: Secondary | ICD-10-CM | POA: Diagnosis not present

## 2022-05-20 DIAGNOSIS — E039 Hypothyroidism, unspecified: Secondary | ICD-10-CM | POA: Diagnosis not present

## 2022-05-20 DIAGNOSIS — Z0000 Encounter for general adult medical examination without abnormal findings: Secondary | ICD-10-CM | POA: Diagnosis not present

## 2022-05-27 ENCOUNTER — Inpatient Hospital Stay: Payer: PPO | Attending: Gynecologic Oncology | Admitting: Gynecologic Oncology

## 2022-05-27 ENCOUNTER — Telehealth: Payer: Self-pay

## 2022-05-27 ENCOUNTER — Encounter: Payer: Self-pay | Admitting: Gynecologic Oncology

## 2022-05-27 VITALS — BP 144/67 | HR 76 | Temp 97.8°F | Ht 63.0 in | Wt 164.6 lb

## 2022-05-27 DIAGNOSIS — K5909 Other constipation: Secondary | ICD-10-CM

## 2022-05-27 DIAGNOSIS — K5900 Constipation, unspecified: Secondary | ICD-10-CM | POA: Insufficient documentation

## 2022-05-27 DIAGNOSIS — N9089 Other specified noninflammatory disorders of vulva and perineum: Secondary | ICD-10-CM

## 2022-05-27 DIAGNOSIS — Z7189 Other specified counseling: Secondary | ICD-10-CM | POA: Diagnosis not present

## 2022-05-27 DIAGNOSIS — Z90722 Acquired absence of ovaries, bilateral: Secondary | ICD-10-CM | POA: Diagnosis not present

## 2022-05-27 DIAGNOSIS — N904 Leukoplakia of vulva: Secondary | ICD-10-CM | POA: Diagnosis not present

## 2022-05-27 DIAGNOSIS — Z90710 Acquired absence of both cervix and uterus: Secondary | ICD-10-CM | POA: Diagnosis not present

## 2022-05-27 DIAGNOSIS — Z8542 Personal history of malignant neoplasm of other parts of uterus: Secondary | ICD-10-CM | POA: Insufficient documentation

## 2022-05-27 DIAGNOSIS — C541 Malignant neoplasm of endometrium: Secondary | ICD-10-CM

## 2022-05-27 NOTE — Progress Notes (Signed)
Gynecologic Oncology Return Clinic Visit  05/27/22  Reason for Visit: surveillance  Treatment History: Oncology History Overview Note  MMR IHC intact   Endometrial cancer [facility]   Initial Diagnosis   Endometrial cancer ([place])   03/02/2021 Initial Biopsy   hysteroscopy with endometrial sampling.  Final pathology from this showed endometrial adenocarcinoma, endometrioid type, FIGO grade 1.  Findings at the time of surgery were endometrial polyps, vaginal and vulvar atrophy.   03/25/2021 Surgery   TRH/BSO, SLN biopsy  Findings: On EUA, small mobile uterus.  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  On intra-abdominal entry, normal upper abdominal survey.  Omentum adherent to the anterior abdominal wall along the midline just inferior to the umbilicus, also along the right abdominal sidewall.  Otherwise omentum normal-appearing.  Normal-appearing small and large bowel.  Uterus 6 cm and normal appearing.  Bilateral adnexa atrophic and normal-appearing.  No obvious adenopathy.  Mapping successful on the right to a presacral node, on the left to a deep obturator and external iliac sentinel lymph node.  Minimal adhesions of the bladder to the cervix.  No intra-abdominal or pelvic evidence of disease.   03/25/2021 Pathology Results   A. LYMPH NODE, SENTINEL, LEFT OBTURATOR, EXCISION:  - One lymph node, negative for malignancy (0/1).   B. LYMPH NODE, SENTINEL, LEFT EXTERNAL ILIAC, EXCISION:  - One lymph node, negative for malignancy (0/1).   C. LYMPH NODE, SENTINEL, RIGHT PRESACRAL, EXCISION:  - One lymph node, negative for malignancy (0/1).   D. 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       - Background endometrioid intraepithelial neoplasia (EIN).       - See Oncology Table.  - Myometrium:        - Superficial involvement by endometrioid adenocarcinoma, less than  50% myometrial invasion.  - Uterine cervix:       - Benign transformation zone.       - Negative for squamous intraepithelial lesion and malignancy.  - Fallopian tubes:       - Benign paratubal cysts.       - Otherwise no significant histopathologic change.  - Ovaries:       - Benign physiologic changes.   E. VULVA, BIOPSIES:  - Benign atrophic squamous mucosa.  - Negative for squamous intraepithelial lesion and malignancy.   ONCOLOGY TABLE:   UTERUS, CARCINOMA OR CARCINOSARCOMA: Resection   Procedure: Total hysterectomy and bilateral salpingo-oophorectomy  Histologic Type: Endometrioid adenocarcinoma, NOS  Histologic Grade: Low-grade (encompassing FIGO 1 and 2)  Myometrial Invasion:       Depth of Myometrial Invasion (mm): 4 mm       Myometrial Thickness (mm): 16 mm       Percentage of Myometrial Invasion: 25%  Uterine Serosa Involvement: Not identified  Cervical stromal Involvement: Not identified  Extent of involvement of other tissue/organs: Not identified  Peritoneal/Ascitic Fluid: Not submitted/unknown  Lymphovascular Invasion: Not identified   Regional Lymph Nodes: Regional lymph nodes present  All regional lymph nodes negative for tumor cells       Pelvic Lymph Nodes Examined:            3 Sentinel                            0 non-sentinel  3 total       Pelvic Lymph Nodes with Metastasis: 0        Para-aortic Lymph Nodes Examined:  0 Sentinel                            0 non-sentinel                            0 total       Para-aortic Lymph Nodes with Metastasis: 0   Distant Metastasis:       Distant Site(s) Involved: Not applicable  Pathologic Stage Classification (pTNM, AJCC 8th Edition): pT1a, pN0  Ancillary Studies: MMR/MSI testing will be ordered  Representative Tumor Block: D2  Comment(s): Pancytokeratin was performed on the lymph nodes and is  negative    Reason for Addendum #1:  Report Clarification  Reason for Addendum #2:  DNA Mismatch Repair IHC Results   Clinical History: Endometrial cancer ([facility])      FINAL MICROSCOPIC DIAGNOSIS:   A. LYMPH NODE, SENTINEL, LEFT OBTURATOR, EXCISION:  - One lymph node, negative for malignancy (0/1).   B. LYMPH NODE, SENTINEL, LEFT EXTERNAL ILIAC, EXCISION:  - One lymph node, negative for malignancy (0/1).   C. LYMPH NODE, SENTINEL, RIGHT PRESACRAL, EXCISION:  - One lymph node, negative for malignancy (0/1).   D. 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       - Background endometrioid intraepithelial neoplasia (EIN).       - See Oncology Table.  - Myometrium:       - Superficial involvement by endometrioid adenocarcinoma, less than  50% myometrial invasion.  - Uterine cervix:       - Benign transformation zone.       - Negative for squamous intraepithelial lesion and malignancy.  - Fallopian tubes:       - Benign paratubal cysts.       - Otherwise no significant histopathologic change.  - Ovaries:       - Benign physiologic changes.   E. VULVA, BIOPSIES:  - Benign atrophic squamous mucosa.  - Negative for squamous intraepithelial lesion and malignancy.   ONCOLOGY TABLE:   UTERUS, CARCINOMA OR CARCINOSARCOMA: Resection   Procedure: Total hysterectomy and bilateral salpingo-oophorectomy  Histologic Type: Endometrioid adenocarcinoma, NOS  Histologic Grade: Low-grade (encompassing FIGO 1 and 2)  Myometrial Invasion:       Depth of Myometrial Invasion (mm): 4 mm       Myometrial Thickness (mm): 16 mm       Percentage of Myometrial Invasion: 25%  Uterine Serosa Involvement: Not identified  Cervical stromal Involvement: Not identified  Extent of involvement of other tissue/organs: Not identified  Peritoneal/Ascitic Fluid: Not submitted/unknown  Lymphovascular Invasion: Not  identified   Regional Lymph Nodes: Regional lymph nodes present  All regional lymph nodes negative for tumor cells       Pelvic Lymph Nodes Examined:            3 Sentinel                            0 non-sentinel                            3 total  Pelvic Lymph Nodes with Metastasis: 0        Para-aortic Lymph Nodes Examined:  0 Sentinel                            0 non-sentinel                            0 total       Para-aortic Lymph Nodes with Metastasis: 0   Distant Metastasis:       Distant Site(s) Involved: Not applicable  Pathologic Stage Classification (pTNM, AJCC 8th Edition): pT1a, pN0  Ancillary Studies: MMR/MSI testing will be ordered  Representative Tumor Block: D2  Comment(s): Pancytokeratin was performed on the lymph nodes and is  negative   (v4.2.0.1)    GROSS DESCRIPTION:   Specimen A: Received fresh is a 2.8 x 1.2 x 0.3 cm yellow-pink soft to  rubbery nodule which is sectioned and entirely submitted in 1 block.   Specimen B: Received fresh is a 1.5 x 0.4 x 0.3 cm yellow-pink soft to  rubbery nodule which is sectioned and entirely submitted 1 block.   Specimen C: Received fresh is a 0.4 cm yellow-pink soft to rubbery  nodule which is bisected and submitted 1 block.   Specimen D: Uterus including cervix and attached bilateral fallopian  tubes and ovaries, received fresh.  Specimen integrity: Intact  Size and shape: The uterine body is symmetrical, 5 x 4.4 x 3.7 cm.  Weight: 54 g without adnexa  Serosa: Pink-purple, smooth  Cervix: 2.6 cm in length, 2.5 cm in diameter, has a pink-white to  hyperemic smooth, focally blue stained ectocervix, and smooth  endocervix.  Endometrium: The endometrial cavity is 3 x 2 cm, and both anterior and  posterior surfaces have tan-pink to dark red smooth, flattened lining.  A discrete mass is not identified.  Myometrium: The tissue just beneath the endometrium in the upper half is  tan-white, indurated, nodular,  and is up to 0.7 cm thick in an area of  myometrium 1.5 cm thick.  Remaining outer half of myometrium is tan-pink  to hyperemic, smooth.  There are no discrete intramural nodules.  Right adnexa: The right fallopian tube there is a 4.6 cm in length and  up to 0.9 cm in diameter fimbriated segment, which has a pink-purple  smooth serosa.  On sectioning, the lumen is dilated up to 0.8 cm and  filled with dark red fluid.  The right ovary is 1.8 x 1.1 x 1 cm, has a  pale yellow-white to dark purple smooth surface and unremarkable cut  surfaces.  Left adnexa: The portion of left tube is a distal 2.1 cm in length and  up to 0.4 cm in diameter distal fimbriated segment which has a  pink-purple smooth serosa and unremarkable cut surfaces.  Proximal half  is surgically absent.  The left ovary is 1.3 x 1.1 x 0.8 cm, has a  pink-white smooth surface and unremarkable cut surface.  Between the  left ovary and portion of left tube is a 2.1 cm smooth clear  fluid-filled cyst.  Block Summary:  Blocks 1-5 = full-thickness endomyometrium, anterior  Block 6-10 = full-thickness endomyometrium, posterior  Block 11 = cervix  Block 12 = sections of right tube and ovary  Block 13 = fimbriated end of right tube, bisected lengthwise  Block 14 = sections of left tube and ovary and intervening  cyst  Block 15 = fimbriated end of left tube, bisected lengthwise   Specimen E: Received fresh are 2 minute pieces of gray-white to dark red  soft tissue/material, in toto 1 block.   SW 03/26/2021     Final Diagnosis performed by Allena Napoleon, MD.   Electronically signed  04/01/2021  Technical component performed at [facility] [place]  [address] [place], [place] [address].   Professional component performed at [facility].  [address] [place], [place] [address]  Immunohistochemistry  Technical component (if applicable) was performed at [facility]. [address], [place], [place],   [address].  IMMUNOHISTOCHEMISTRY DISCLAIMER (if applicable): Some of these  immunohistochemical stains may have been developed and the performance  characteristics determine by [facility]. Some may not have  been cleared or approved by the U.S. Food and Drug Administration. The  FDA has determined that such clearance or approval is not necessary.  This test is used for clinical purposes. It should not be regarded as  investigational or for research. This laboratory is certified under the  [place] (CLIA-88) as  qualified to perform high complexity clinical laboratory testing.  The  controls stained appropriately.   ADDENDUM:  The endometrioid adenocarcinoma is low-grade and well  differentiated consistent with FIGO grade 1.    03/25/2021 Pathologic Stage   Stage IA, grade 1 25% MI Neg SLNs No LVI     Interval History: Patient reports overall doing well.  She denies any vaginal bleeding or discharge.  She denies any pelvic or abdominal pain.  Still continues to struggle with bowel function.  Takes a laxative to keep regular, still has trouble feeling like she empties completely.  Has been evaluated by GI with plan for anal manometry.  If this reveals pelvic floor dysfunction, plan is to send her for pelvic floor physical therapy.  If not, then she will need to undergo further diagnostic workup.  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  She was recently treated for vulvar yeast by her dermatologist with once weekly dosing of Diflucan x 4.  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  Past Medical/Surgical History: Past Medical History:  Diagnosis Date   Cancer 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 Jannifer Franklin;  started complex partial seizures post 2  wks cva in 1996;  now focal seizures, bilateral black spots visual fields that resolve about 10 min. , tend to happen with stress, no loc is aware of surrounding's,  controlled with topamax  (02-24-2021  pt stated last one approx 6 months ago)   GERD (gastroesophageal reflux disease)    Herpes genitalis    Hiatal hernia 2007   History of COVID-19 04/2019   per pt mild symptoms that resolved   History of hemorrhagic cerebrovascular accident (CVA) with residual deficit 1996   neurologist-- dr c. 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  Lichen sclerosus    labia   Osteopenia    Personal history of PE (pulmonary embolism) 1975   per pt post op breast augmentation/ BTL one week,  stated completed anticoagation, and none before or after   PMB (postmenopausal bleeding)    gyn-- dr Landry Mellow   Pneumonia    Stroke [facility])    Hemorragic 1996   Thickened endometrium    Vitamin D deficiency    Wears glasses     Past Surgical History:  Procedure Laterality Date   APPENDECTOMY  1955   AUGMENTATION MAMMAPLASTY Bilateral [id number]   silicone   BROW LIFT Bilateral 06/07/2019   Procedure: BLEPHAROPLASTY UPPER EYELID WITH EXCESS SKIN;  Surgeon: Karle Starch, MD;  Location: [facility];  Service: Ophthalmology;  Laterality: Bilateral;   CATARACT EXTRACTION W/ INTRAOCULAR LENS IMPLANT Bilateral    left 06/ 2006;  right 08/ 2009   DILATATION & CURETTAGE/HYSTEROSCOPY WITH MYOSURE N/A 03/02/2021   Procedure: DILATATION & CURETTAGE/HYSTEROSCOPY WITH MYOSURE;  Surgeon: Christophe Louis, MD;  Location: [facility];   Service: Gynecology;  Laterality: N/A;   DILATION AND CURETTAGE OF UTERUS  1972   MELANOMA EXCISION  07/1997   left shoulder   ROBOTIC ASSISTED TOTAL HYSTERECTOMY WITH BILATERAL SALPINGO OOPHERECTOMY N/A 03/25/2021   Procedure: XI ROBOTIC ASSISTED TOTAL HYSTERECTOMY WITH BILATERAL SALPINGO OOPHORECTOMY;  Surgeon: Lafonda Mosses, MD;  Location: [facility];  Service: Gynecology;  Laterality: N/A;   SENTINEL NODE BIOPSY N/A 03/25/2021   Procedure: SENTINEL NODE BIOPSY;  Surgeon: Lafonda Mosses, MD;  Location: [facility];  Service: Gynecology;  Laterality: N/A;   thrombosed hemorrhoid  surgery     [place]   right leg   VULVA /PERINEUM BIOPSY N/A 03/25/2021   Procedure: VULVAR BIOPSY;  Surgeon: Lafonda Mosses, MD;  Location: [facility];  Service: Gynecology;  Laterality: N/A;    Family History  Problem Relation Age of Onset   Heart attack Mother    Heart attack Father    Diabetes Father    Atrial fibrillation Father    Lymphoma Brother    Cancer Maternal Grandmother        esophageal   Seizures Neg Hx    Colon cancer Neg Hx    Breast cancer Neg Hx    Ovarian cancer Neg Hx    Endometrial cancer Neg Hx    Pancreatic cancer Neg Hx    Prostate cancer Neg Hx     Social History   Socioeconomic History   Marital status: Married    Spouse name: Not on file   Number of children: 3   Years of education: 13   Highest education level: Not on file  Occupational History   Occupation: retired  Tobacco Use   Smoking status: Former    Years: 20.00    Types: Cigarettes    Quit date: 09/26/1989    Years since quitting: 32.6   Smokeless tobacco: Never  Vaping Use   Vaping Use: Never used  Substance and Sexual Activity   Alcohol use: Yes    Alcohol/week: 7.0 standard drinks of alcohol    Types: 7 Glasses of wine per week   Drug use: Never   Sexual activity: Not Currently    Birth control/protection: Post-menopausal   Other Topics Concern   Not on file  Social History Narrative   Lives at home w/ her husband   Patient  drinks 1-2 cups of caffeine daily.   Patient is right handed.   Social Determinants of Health   Financial Resource Strain: Not on file  Food Insecurity: Not on file  Transportation Needs: Not on file  Physical Activity: Not on file  Stress: Not on file  Social Connections: Not on file    Current Medications:  Current Outpatient Medications:    clobetasol cream (TEMOVATE) [id number] %, Apply 1 application topically 2 (two) times a week., Disp: , Rfl:    docusate sodium (COLACE) 100 MG capsule, Take 100 mg by mouth daily as needed for mild constipation., Disp: , Rfl:    famotidine (PEPCID) 40 MG tablet, Take 40 mg by mouth 2 (two) times daily., Disp: , Rfl:    fexofenadine (ALLEGRA) 180 MG tablet, Take 180 mg by mouth every evening., Disp: , Rfl:    fluconazole (DIFLUCAN) 150 MG tablet, Take 1 tablet (150 mg total) by mouth daily., Disp: 1 tablet, Rfl: 0   ibuprofen (ADVIL) 800 MG tablet, Take 1 tablet (800 mg total) by mouth every 8 (eight) hours as needed for moderate pain or mild pain., Disp: 30 tablet, Rfl: 0   LORazepam (ATIVAN) 1 MG tablet, Take 1 mg by mouth at bedtime., Disp: , Rfl: 1   nystatin-triamcinolone ointment (MYCOLOG), Apply 1 application topically 2 (two) times daily. Apply to inside of upper buttocks, Disp: 30 g, Rfl: 1   polyethylene glycol (MIRALAX / GLYCOLAX) 17 g packet, Take 17 g by mouth daily., Disp: , Rfl:    Probiotic Product (PROBIOTIC DAILY PO), Take by mouth., Disp: , Rfl:    psyllium (METAMUCIL) 58.6 % packet, Take 1 packet by mouth every other day., Disp: , Rfl:    senna-docusate (SENOKOT-S) 8.6-50 MG tablet, Take 1 tablet by mouth daily., Disp: , Rfl:    topiramate (TOPAMAX) 100 MG tablet, TAKE 1 TABLET BY MOUTH IN THE MORNING AND 2 IN THE EVENING, Disp: 270 tablet, Rfl: 3   valACYclovir (VALTREX) 500 MG tablet, Take 500 mg by mouth every evening., Disp:  , Rfl:    conjugated estrogens (PREMARIN) vaginal cream, as directed Vaginal 1 time a day for 2 weeks then 2 times a week for 30 days, Disp: , Rfl:    enoxaparin (LOVENOX) 40 MG/0.4ML injection, Inject 0.4 mLs (40 mg total) into the skin daily for 28 days., Disp: 11.2 mL, Rfl: 0  Review of Systems: Denies appetite changes, fevers, chills, fatigue, unexplained weight changes. Denies hearing loss, neck lumps or masses, mouth sores, ringing in ears or voice changes. Denies cough or wheezing.  Denies shortness of breath. Denies chest pain or palpitations. Denies leg swelling. Denies abdominal distention, pain, blood in stools, constipation, diarrhea, nausea, vomiting, or early satiety. Denies pain with intercourse, dysuria, frequency, hematuria or incontinence. Denies hot flashes, pelvic pain, vaginal bleeding or vaginal discharge.   Denies joint pain, back pain or muscle pain/cramps. Denies itching, rash, or wounds. Denies dizziness, headaches, numbness or seizures. Denies swollen lymph nodes or glands, denies easy bruising or bleeding. Denies anxiety, depression, confusion, or decreased concentration.  Physical Exam: BP (!) 144/67 (BP Location: Right Arm, Patient Position: Sitting)   Pulse 76   Temp 97.8 F (36.6 C)   Ht 5' 3"$  (1.6 m)   Wt 164 lb 9.6 oz (74.7 kg)   SpO2 100%   BMI 29.16 kg/m  General: Alert, oriented, no acute distress. HEENT: Normocephalic, atraumatic, sclera anicteric. Chest: Clear to auscultation bilaterally.  No wheezes or rhonchi. Cardiovascular: Regular rate  and rhythm, no murmurs. Abdomen: soft, nontender.  Normoactive bowel sounds.  No masses or hepatosplenomegaly appreciated.  Well-healed incisions. Extremities: Grossly normal range of motion.  Warm, well perfused.  No edema bilaterally. Skin: No rashes or lesions noted. Lymphatics: No cervical, supraclavicular, or inguinal adenopathy. GU: External female genitalia notable for loss of architecture and mild  erythema of bilateral labia, more notable on the right.  Patient has significant tenderness with palpation of her labia.  On speculum exam, moderately atrophic vaginal mucosa, no lesions noted.  Cuff intact.  On single digit bimanual exam, no nodularity or masses appreciated.  This exam as well as rectovaginal exam are poorly tolerated by the patient.  Laboratory & Radiologic Studies: None new  Assessment & Plan: [name] is a 82 y.o. 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 MMR intact.  The patient is doing well.  She is NED on exam today.  I will have my office reach out to the patient's dermatologist.  She thinks she had a biopsy last summer.  I am concerned based on how sensitive she is on exam today.  This may be related to her lichen sclerosis which she has been treating less than previously because of recent squamous cell carcinoma excision on her left hand.  She will restart clobetasol treatment.  I asked her to keep me updated about upcoming anal manometry.  While she certainly could have had some worsening of pelvic floor dysfunction with her hysterectomy, from an anatomic standpoint, there was no surgery performed on the rectum itself.  Additionally, given simple hysterectomy, I think the risk that there was any significant injury to innervation of the rectum is low.  I offered to send the referral to pelvic floor physical therapy now.  The patient's preference is to wait to see what the testing shows.  Per NCCN surveillance recommendations, we will continue with surveillance visits every 6 months for the next 5 years and then yearly thereafter.  We will plan to continue alternating these visits between my office and her OB/GYN.  She will see her OB/GYN over the summer.  I have asked her to call back at the end of the year to schedule a visit to see me in February.    We reviewed signs and  symptoms that would be concerning for cancer recurrence, and I have stressed the importance of calling if she develops any of these between visits.  22 minutes of total time was spent for this patient encounter, including preparation, face-to-face counseling with the patient and coordination of care, and documentation of the encounter.  Jeral Pinch, MD  Division of Gynecologic Oncology  Department of Obstetrics and Gynecology  [facility]

## 2022-05-27 NOTE — Telephone Encounter (Signed)
Pt states she had a biopsy done over the summer 2023 at [facility] in [place] Dr. Karle Barr.   LVM ([phone]) on nurses line asking for a call back, Per Dr. Berline Lopes we would like to receive the office notes/results of biopsy.

## 2022-05-27 NOTE — Telephone Encounter (Signed)
[name] from [facility] returned call. She states [name] had a vulvar biopsy in June 2023. She will fax office note and pathology results.

## 2022-05-27 NOTE — Telephone Encounter (Signed)
Records received and given to Dr. Berline Lopes for review

## 2022-05-27 NOTE — Addendum Note (Signed)
Addended by: Lafonda Mosses on: 05/27/2022 05:24 PM   Modules accepted: Orders

## 2022-05-27 NOTE — Patient Instructions (Addendum)
It was good to see you today.  I do not see or feel any evidence of cancer recurrence on your exam.  My office will reach out to your dermatologist to get your biopsy results from last summer.  Please keep me posted after your upcoming manometry study.  I will see you for follow-up in 12 months.  Please call my clinic at the end of the year to schedule visit to see me in February 2025.  As always, if you develop any new and concerning symptoms before your next visit, please call to see me sooner.

## 2022-06-21 ENCOUNTER — Encounter: Payer: Self-pay | Admitting: Gynecologic Oncology

## 2022-06-22 ENCOUNTER — Encounter: Payer: Self-pay | Admitting: Adult Health

## 2022-06-22 ENCOUNTER — Ambulatory Visit: Payer: PPO | Admitting: Adult Health

## 2022-06-22 VITALS — BP 125/68 | HR 74 | Ht 63.0 in | Wt 167.8 lb

## 2022-06-22 DIAGNOSIS — R569 Unspecified convulsions: Secondary | ICD-10-CM | POA: Diagnosis not present

## 2022-06-22 MED ORDER — TOPIRAMATE 100 MG PO TABS: ORAL_TABLET | ORAL | 3 refills | Status: DC

## 2022-06-22 NOTE — Patient Instructions (Signed)
Your Plan:  Continue Topamax If your symptoms worsen or you develop new symptoms please let us know.   Thank you for coming to see [place] at [facility]. I hope we have been able to provide you high quality care today.  You may receive a patient satisfaction survey over the next few weeks. We would appreciate your feedback and comments so that we may continue to improve ourselves and the health of our patients.

## 2022-06-22 NOTE — Progress Notes (Signed)
PATIENT: [name] DOB: 07/10/1940  REASON FOR VISIT: follow up HISTORY FROM: patient  Chief Complaint  Patient presents with   Follow-up    Pt in 19 pt here for stroke f/u Pt states she feels great today Pt states no questions or concerns for todays visit      HISTORY OF PRESENT ILLNESS: Today 06/22/22:   [name] is a 82 y.o. 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 Returns today for follow-up. No additional seizure events. No ocular changes. Remains on Topamax 100 mg in the a.m. and 200 mg in the p.m.  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  She returns today for follow-up.   06/16/21: [name] is a 82 year old female with a history of left hemorrhagic stroke and subsequent seizures.  She returns today for follow-up.  She is currently on Topamax 100 mg in the a.m. and 200 mg in the p.m. at the last visit it was felt that some of her events that she was describing could be an ocular migraine however the patient did not want to change/add anything to her  treatment  plan. EEG has been normal.  Patient reports that since the last visit she has not had any episodes.  She has continued to do really well.  Returns today for follow-up.  12/15/20: [name] is a 82 year old female with a history of left hemorrhagic stroke and subsequent seizures.  She returns today for follow-up.  At the last visit the patient had had several events with the only symptom began black spots in the visual field bilaterally.  These events always occurred after a stressful event.  We discussed adding on Keppra however the patient asked to wait until this visit as the medication was expensive.  She has continued on Topamax 100 mg in the morning and 200 mg in the evening.  Since our last visit she has had 3 other events.  The only symptom that she has is black spots in visual fields in both eyes.  She denies  any trouble speaking with these events.  She states that she is completely aware of her surroundings and can have a conversation with her husband when it is happening.  She states after about 10 minutes it resolves.  The patient is hesitant to try any new medication.  Reviewing her notes from [place] from 2015 and from Dr. Jannifer Franklin she was not reporting black spots but rather blurry vision.  Looking at our system and EEG has not been completed through our office-her last MRI of the brain was in 2013.  06/09/20: [name] is a 82 year old female with a history of left hemorrhagic stroke and subsequent seizures.  She returns today for follow-up.  She continues on Topamax 100 mg in the morning and 200 mg in the evening.  At the last visit she was reporting black spots that  would appear in both eyes that lasted for several minutes.  She felt these events were different than her original events as she did not have trouble speaking.  She reports that the events that occurring with her eyes with the spots are very similar to what she had before.  She did see ophthalmology and her exam was relatively unremarkable with the exception of dry eyes and visual acuity.  She reports that she did not have any events from June to this past January.  She states in  January she had just recovered from [facility] and she had the event that lasted for approximately 15 minutes.  The only symptoms was black spots in both eyes no additional symptoms.  All of her events seem to occur after a stressful event.  The patient reports that prior to coming to our office she had tried 5 or 6 other seizure medications.  She is unsure of the name of these medications.  11/27/19: [name] is a 82 year old female with a history of left hemorrhagic stroke and subsequent seizures.  She returns today for follow-up.  She remains on Topamax.  She states that she has had 3 events since January.  She reports that these events were slightly different.  She states  that they presented with black spots in front of her vision in both visual fields.  She states that this lasted approximately 10 minutes.  Denies these events being followed by headache.  During this time she did not have any trouble speaking.  No weakness in the arms or legs.  She states that this happened again in March and then again in April.  She is not had any subsequent events.  She states that she has been under a lot of stress over the last year.  Reports that her dad passed away and then her son unexpectedly passed.  She has a follow-up with ophthalmology next month and plans to discuss the symptoms as well.  She denies any additional strokelike symptoms.  She returns today for an evaluation.  HISTORY 11/26/18:   [name] is a 82 year old female with a history of left hemorrhagic stroke in 1996 and subsequent seizures.  She returns today for follow-up.  She remains on Topamax taking 100 mg in the morning and 200 mg in the evening.  She reports that she has had 2 events over the last year.  The first event was December 24, 2017.  She states that it started off with a bad headache and then she could not remember names.  It lasted for approximately 1 hour.  She does note that stressful events typically caused her episodes.  She states 1 week prior she was bitten by her dog and had to go to her PCP and was put on antibiotics.  She states the second event was December 18 she states that she and her [place] [facility].  She states that she developed aphasia could not talk or understand what people are saying for approximately 30 minutes.  She states that 1 week prior her best friend had passed away.  Fortunately she is not had any episodes this year.  She states that she typically lets her husband drive.  If she does drive her husband is typically in the car with her.  She is able to complete all ADLs independently.  She returns today for follow-up.  REVIEW OF SYSTEMS: Out of a  complete 14 system review of symptoms, the patient complains only of the following symptoms, and all other reviewed systems are negative.  See HPI  ALLERGIES: Allergies  Allergen Reactions   Citalopram     Sleepy, dull feeling   Augmentin [Amoxicillin-Pot Clavulanate] Diarrhea    severe   Bacitracin Other (See Comments)   Doxycycline Diarrhea   Doxycycline Monohydrate Diarrhea   Moxifloxacin     Redness, non-healing (eye)   Moxifloxacin Hcl     Other reaction(s): redness (eyes)   Latex Rash    Bandaids/tape Other reaction(s): redness   Septra [Sulfamethoxazole-Trimethoprim]  Rash   Tape Rash   Zithromax [Azithromycin] Rash    HOME MEDICATIONS: Outpatient Medications Prior to Visit  Medication Sig Dispense Refill   clobetasol cream (TEMOVATE) [id number] % Apply 1 application topically 2 (two) times a week.     conjugated estrogens (PREMARIN) vaginal cream as directed Vaginal 1 time a day for 2 weeks then 2 times a week for 30 days     docusate sodium (COLACE) 100 MG capsule Take 100 mg by mouth daily as needed for mild constipation.     famotidine (PEPCID) 40 MG tablet Take 40 mg by mouth 2 (two) times daily.     fluconazole (DIFLUCAN) 150 MG tablet Take 1 tablet (150 mg total) by mouth daily. 1 tablet 0   LORazepam (ATIVAN) 1 MG tablet Take 1 mg by mouth at bedtime.  1   polyethylene glycol (MIRALAX / GLYCOLAX) 17 g packet Take 17 g by mouth daily.     Probiotic Product (PROBIOTIC DAILY PO) Take by mouth.     psyllium (METAMUCIL) 58.6 % packet Take 1 packet by mouth every other day.     senna-docusate (SENOKOT-S) 8.6-50 MG tablet Take 1 tablet by mouth daily.     topiramate (TOPAMAX) 100 MG tablet TAKE 1 TABLET BY MOUTH IN THE MORNING AND 2 IN THE EVENING 270 tablet 3   No facility-administered medications prior to visit.    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 , tend to happen with stress, no loc is aware of surrounding's,  controlled with topamax  (02-24-2021  pt stated last one approx 6 months ago)   GERD (gastroesophageal reflux disease)    Herpes genitalis    Hiatal hernia 2007   History of COVID-19 04/2019   per pt mild symptoms that resolved   History of hemorrhagic cerebrovascular accident (CVA) with residual deficit 1996   neurologist-- dr c. 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    Lichen sclerosus    labia   Osteopenia    Personal history of PE (pulmonary embolism) 1975   per pt post op breast augmentation/ BTL one week,  stated completed anticoagation, and none before or after   PMB (postmenopausal bleeding)    gyn-- dr Landry Mellow   Pneumonia    Stroke [facility])    Hemorragic 1996   Thickened endometrium    Vitamin D deficiency    Wears glasses     PAST SURGICAL HISTORY: Past Surgical History:  Procedure Laterality Date   APPENDECTOMY  1955   AUGMENTATION MAMMAPLASTY Bilateral [id number]   silicone   BROW LIFT Bilateral 06/07/2019   Procedure: BLEPHAROPLASTY UPPER EYELID WITH EXCESS SKIN;  Surgeon: Karle Starch, MD;  Location: [facility];  Service: Ophthalmology;  Laterality: Bilateral;   CATARACT EXTRACTION W/ INTRAOCULAR LENS IMPLANT Bilateral  left 06/ 2006;  right 08/ 2009   DILATATION & CURETTAGE/HYSTEROSCOPY WITH MYOSURE N/A 03/02/2021   Procedure: DILATATION &  CURETTAGE/HYSTEROSCOPY WITH MYOSURE;  Surgeon: Christophe Louis, MD;  Location: [facility];  Service: Gynecology;  Laterality: N/A;   DILATION AND CURETTAGE OF UTERUS  1972   MELANOMA EXCISION  07/1997   left shoulder   ROBOTIC ASSISTED TOTAL HYSTERECTOMY WITH BILATERAL SALPINGO OOPHERECTOMY N/A 03/25/2021   Procedure: XI ROBOTIC ASSISTED TOTAL HYSTERECTOMY WITH BILATERAL SALPINGO OOPHORECTOMY;  Surgeon: Lafonda Mosses, MD;  Location: [facility];  Service: Gynecology;  Laterality: N/A;   SENTINEL NODE BIOPSY N/A 03/25/2021   Procedure: SENTINEL NODE BIOPSY;  Surgeon: Lafonda Mosses, MD;  Location: [facility];  Service: Gynecology;  Laterality: N/A;   thrombosed hemorrhoid  surgery     [place]   right leg   VULVA /PERINEUM BIOPSY N/A 03/25/2021   Procedure: VULVAR BIOPSY;  Surgeon: Lafonda Mosses, MD;  Location: [facility];  Service: Gynecology;  Laterality: N/A;    FAMILY HISTORY: Family History  Problem Relation Age of Onset   Heart attack Mother    Heart attack Father    Diabetes Father    Atrial fibrillation Father    Lymphoma Brother    Cancer Maternal Grandmother        esophageal   Seizures Neg Hx    Colon cancer Neg Hx    Breast cancer Neg Hx    Ovarian cancer Neg Hx    Endometrial cancer Neg Hx    Pancreatic cancer Neg Hx    Prostate cancer Neg Hx     SOCIAL HISTORY: Social History   Socioeconomic History   Marital status: Married    Spouse name: Not on file   Number of children: 3   Years of education: 13   Highest education level: Not on file  Occupational History   Occupation: retired  Tobacco Use   Smoking status: Former    Years: 20.00    Types: Cigarettes    Quit date: 09/26/1989    Years since quitting: 32.7   Smokeless tobacco: Never  Vaping Use   Vaping Use: Never used  Substance and Sexual Activity   Alcohol use: Yes    Alcohol/week: 7.0 standard drinks of alcohol     Types: 7 Glasses of wine per week   Drug use: Never   Sexual activity: Not Currently    Birth control/protection: Post-menopausal  Other Topics Concern   Not on file  Social History Narrative   Lives at home w/ her husband   Patient drinks 1-2 cups of caffeine daily.   Patient is right handed.   Social Determinants of Health   Financial Resource Strain: Not on file  Food Insecurity: Not on file  Transportation Needs: Not on file  Physical Activity: Not on file  Stress: Not on file  Social Connections: Not on file  Intimate Partner Violence: Not on file      PHYSICAL EXAM  Vitals:   06/22/22 1331  BP: 125/68  Pulse: 74  Weight: 167 lb 12.8 oz (76.1 kg)  Height: '5\' 3"'$  (1.6 m)     Body mass index is 29.72 kg/m.  Generalized: Well developed, in no acute distress   Neurological examination  Mentation: Alert oriented to time, place, history taking. Follows all commands speech and language fluent Cranial nerve II-XII: Pupils were equal round reactive to light.  Extraocular movements were full, visual field were full on confrontational test. Head turning and shoulder shrug  were normal and symmetric. Motor: The motor testing reveals 5 over 5 strength of all 4 extremities. Good symmetric motor tone is noted throughout.  Sensory: Sensory testing is intact to soft touch on all 4 extremities. No evidence of extinction is noted.  Coordination: Cerebellar testing reveals good finger-nose-finger and heel-to-shin bilaterally.  Gait and station: Gait is normal.    DIAGNOSTIC DATA (LABS, IMAGING, TESTING) - I reviewed patient records, labs, notes, testing and imaging myself where available.  Lab Results  Component Value Date   WBC 5.7 03/16/2021   HGB 14.3 03/16/2021   HCT 42.8 03/16/2021   MCV 98.6 03/16/2021   PLT 192 03/16/2021      Component Value Date/Time   NA 131 (L) 03/16/2021 1138   K 4.5 03/16/2021 1138   CL 103 03/16/2021 1138   CO2 23 03/16/2021 1138    GLUCOSE 102 (H) 03/16/2021 1138   BUN 15 03/16/2021 1138   CREATININE 0.78 03/16/2021 1138   CALCIUM 9.2 03/16/2021 1138   PROT 7.0 03/16/2021 1138   ALBUMIN 4.1 03/16/2021 1138   AST 204 (H) 03/16/2021 1138   ALT 628 (H) 03/16/2021 1138   ALKPHOS 397 (H) 03/16/2021 1138   BILITOT 0.6 03/16/2021 1138   GFRNONAA >60 03/16/2021 1138   GFRAA 109 09/01/2006 1405   Lab Results  Component Value Date   CHOL 235 (HH) 09/01/2006   HDL 75.5 09/01/2006   LDLDIRECT 135.4 09/01/2006   TRIG 67 09/01/2006   CHOLHDL 3.1 CALC 09/01/2006   Lab Results  Component Value Date   HGBA1C 5.4 09/01/2006   Lab Results  Component Value Date   VITAMINB12 208 (L) 09/05/2006   No results found for: "TSH"    ASSESSMENT AND PLAN 82 y.o. 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 here with :  Seizures  Continue Topamax 100 mg in the AM, 200 mg in the PM No additional episodes since last seen Follow-up in 1 year  or sooner if needed    Ward Givens, MSN, NP-C 06/22/2022, 1:24 PM [facility] [address], [place], [place] [address] [phone]

## 2022-08-03 DIAGNOSIS — K5900 Constipation, unspecified: Secondary | ICD-10-CM | POA: Diagnosis not present

## 2022-08-03 DIAGNOSIS — R933 Abnormal findings on diagnostic imaging of other parts of digestive tract: Secondary | ICD-10-CM | POA: Diagnosis not present

## 2022-08-04 DIAGNOSIS — K5902 Outlet dysfunction constipation: Secondary | ICD-10-CM | POA: Diagnosis not present

## 2022-08-18 DIAGNOSIS — L309 Dermatitis, unspecified: Secondary | ICD-10-CM | POA: Diagnosis not present

## 2022-09-16 DIAGNOSIS — K219 Gastro-esophageal reflux disease without esophagitis: Secondary | ICD-10-CM | POA: Diagnosis not present

## 2022-09-16 DIAGNOSIS — Z1211 Encounter for screening for malignant neoplasm of colon: Secondary | ICD-10-CM | POA: Diagnosis not present

## 2022-09-16 DIAGNOSIS — N8184 Pelvic muscle wasting: Secondary | ICD-10-CM | POA: Diagnosis not present

## 2022-10-26 ENCOUNTER — Telehealth: Payer: Self-pay | Admitting: Adult Health

## 2022-10-26 NOTE — Telephone Encounter (Signed)
LVM and sent mychart msg informing pt of appt change- [name] out 3/6.

## 2022-11-10 DIAGNOSIS — L820 Inflamed seborrheic keratosis: Secondary | ICD-10-CM | POA: Diagnosis not present

## 2022-11-10 DIAGNOSIS — D225 Melanocytic nevi of trunk: Secondary | ICD-10-CM | POA: Diagnosis not present

## 2022-11-10 DIAGNOSIS — D2272 Melanocytic nevi of left lower limb, including hip: Secondary | ICD-10-CM | POA: Diagnosis not present

## 2022-11-10 DIAGNOSIS — Z85828 Personal history of other malignant neoplasm of skin: Secondary | ICD-10-CM | POA: Diagnosis not present

## 2022-11-10 DIAGNOSIS — D2262 Melanocytic nevi of left upper limb, including shoulder: Secondary | ICD-10-CM | POA: Diagnosis not present

## 2022-11-10 DIAGNOSIS — L538 Other specified erythematous conditions: Secondary | ICD-10-CM | POA: Diagnosis not present

## 2022-11-10 DIAGNOSIS — L710 Perioral dermatitis: Secondary | ICD-10-CM | POA: Diagnosis not present

## 2022-11-10 DIAGNOSIS — L309 Dermatitis, unspecified: Secondary | ICD-10-CM | POA: Diagnosis not present

## 2022-11-10 DIAGNOSIS — Z85820 Personal history of malignant melanoma of skin: Secondary | ICD-10-CM | POA: Diagnosis not present

## 2022-11-10 DIAGNOSIS — D2261 Melanocytic nevi of right upper limb, including shoulder: Secondary | ICD-10-CM | POA: Diagnosis not present

## 2022-11-11 DIAGNOSIS — B880 Other acariasis: Secondary | ICD-10-CM | POA: Diagnosis not present

## 2022-11-11 DIAGNOSIS — Z9889 Other specified postprocedural states: Secondary | ICD-10-CM | POA: Diagnosis not present

## 2022-11-11 DIAGNOSIS — B301 Conjunctivitis due to adenovirus: Secondary | ICD-10-CM | POA: Diagnosis not present

## 2022-11-17 DIAGNOSIS — L900 Lichen sclerosus et atrophicus: Secondary | ICD-10-CM | POA: Diagnosis not present

## 2022-11-17 DIAGNOSIS — N952 Postmenopausal atrophic vaginitis: Secondary | ICD-10-CM | POA: Diagnosis not present

## 2022-11-17 DIAGNOSIS — Z8542 Personal history of malignant neoplasm of other parts of uterus: Secondary | ICD-10-CM | POA: Diagnosis not present

## 2022-11-17 DIAGNOSIS — N762 Acute vulvitis: Secondary | ICD-10-CM | POA: Diagnosis not present

## 2022-11-22 DIAGNOSIS — I499 Cardiac arrhythmia, unspecified: Secondary | ICD-10-CM | POA: Diagnosis not present

## 2022-11-22 DIAGNOSIS — I4891 Unspecified atrial fibrillation: Secondary | ICD-10-CM | POA: Diagnosis not present

## 2022-11-22 DIAGNOSIS — E039 Hypothyroidism, unspecified: Secondary | ICD-10-CM | POA: Diagnosis not present

## 2022-11-22 DIAGNOSIS — Z8673 Personal history of transient ischemic attack (TIA), and cerebral infarction without residual deficits: Secondary | ICD-10-CM | POA: Diagnosis not present

## 2022-11-22 DIAGNOSIS — Z86711 Personal history of pulmonary embolism: Secondary | ICD-10-CM | POA: Diagnosis not present

## 2022-11-22 DIAGNOSIS — E7800 Pure hypercholesterolemia, unspecified: Secondary | ICD-10-CM | POA: Diagnosis not present

## 2022-11-22 DIAGNOSIS — M8588 Other specified disorders of bone density and structure, other site: Secondary | ICD-10-CM | POA: Diagnosis not present

## 2022-12-02 DIAGNOSIS — I4891 Unspecified atrial fibrillation: Secondary | ICD-10-CM | POA: Diagnosis not present

## 2022-12-06 DIAGNOSIS — F419 Anxiety disorder, unspecified: Secondary | ICD-10-CM | POA: Diagnosis not present

## 2022-12-06 DIAGNOSIS — R600 Localized edema: Secondary | ICD-10-CM | POA: Diagnosis not present

## 2022-12-06 DIAGNOSIS — I4891 Unspecified atrial fibrillation: Secondary | ICD-10-CM | POA: Diagnosis not present

## 2022-12-06 DIAGNOSIS — G4700 Insomnia, unspecified: Secondary | ICD-10-CM | POA: Diagnosis not present

## 2022-12-22 DIAGNOSIS — R5383 Other fatigue: Secondary | ICD-10-CM | POA: Diagnosis not present

## 2022-12-22 DIAGNOSIS — I4891 Unspecified atrial fibrillation: Secondary | ICD-10-CM | POA: Diagnosis not present

## 2022-12-22 DIAGNOSIS — R109 Unspecified abdominal pain: Secondary | ICD-10-CM | POA: Diagnosis not present

## 2022-12-23 ENCOUNTER — Other Ambulatory Visit: Payer: Self-pay | Admitting: Internal Medicine

## 2022-12-23 ENCOUNTER — Ambulatory Visit
Admission: RE | Admit: 2022-12-23 | Discharge: 2022-12-23 | Disposition: A | Discharge status: Home / Self Care | Payer: PPO | Source: Ambulatory Visit | Attending: Internal Medicine | Admitting: Internal Medicine

## 2022-12-23 DIAGNOSIS — R109 Unspecified abdominal pain: Secondary | ICD-10-CM

## 2022-12-27 DIAGNOSIS — L900 Lichen sclerosus et atrophicus: Secondary | ICD-10-CM | POA: Diagnosis not present

## 2022-12-27 DIAGNOSIS — B3731 Acute candidiasis of vulva and vagina: Secondary | ICD-10-CM | POA: Diagnosis not present

## 2022-12-27 DIAGNOSIS — L821 Other seborrheic keratosis: Secondary | ICD-10-CM | POA: Diagnosis not present

## 2022-12-27 DIAGNOSIS — N952 Postmenopausal atrophic vaginitis: Secondary | ICD-10-CM | POA: Diagnosis not present

## 2022-12-28 ENCOUNTER — Encounter: Payer: Self-pay | Admitting: Obstetrics and Gynecology

## 2023-01-02 DIAGNOSIS — C44222 Squamous cell carcinoma of skin of right ear and external auricular canal: Secondary | ICD-10-CM | POA: Diagnosis not present

## 2023-01-02 DIAGNOSIS — D485 Neoplasm of uncertain behavior of skin: Secondary | ICD-10-CM | POA: Diagnosis not present

## 2023-01-02 DIAGNOSIS — L309 Dermatitis, unspecified: Secondary | ICD-10-CM | POA: Diagnosis not present

## 2023-01-03 DIAGNOSIS — I4891 Unspecified atrial fibrillation: Secondary | ICD-10-CM | POA: Diagnosis not present

## 2023-01-03 DIAGNOSIS — R600 Localized edema: Secondary | ICD-10-CM | POA: Diagnosis not present

## 2023-01-03 DIAGNOSIS — Z23 Encounter for immunization: Secondary | ICD-10-CM | POA: Diagnosis not present

## 2023-01-03 DIAGNOSIS — R109 Unspecified abdominal pain: Secondary | ICD-10-CM | POA: Diagnosis not present

## 2023-01-05 ENCOUNTER — Other Ambulatory Visit: Payer: Self-pay | Admitting: Obstetrics and Gynecology

## 2023-01-05 DIAGNOSIS — Z1231 Encounter for screening mammogram for malignant neoplasm of breast: Secondary | ICD-10-CM

## 2023-01-12 DIAGNOSIS — N8184 Pelvic muscle wasting: Secondary | ICD-10-CM | POA: Diagnosis not present

## 2023-01-12 DIAGNOSIS — K219 Gastro-esophageal reflux disease without esophagitis: Secondary | ICD-10-CM | POA: Diagnosis not present

## 2023-01-12 DIAGNOSIS — Z1211 Encounter for screening for malignant neoplasm of colon: Secondary | ICD-10-CM | POA: Diagnosis not present

## 2023-01-14 ENCOUNTER — Other Ambulatory Visit: Payer: Self-pay | Admitting: Internal Medicine

## 2023-01-14 DIAGNOSIS — M8580 Other specified disorders of bone density and structure, unspecified site: Secondary | ICD-10-CM

## 2023-02-10 DIAGNOSIS — N898 Other specified noninflammatory disorders of vagina: Secondary | ICD-10-CM | POA: Diagnosis not present

## 2023-02-10 DIAGNOSIS — B3731 Acute candidiasis of vulva and vagina: Secondary | ICD-10-CM | POA: Diagnosis not present

## 2023-02-17 DIAGNOSIS — H01003 Unspecified blepharitis right eye, unspecified eyelid: Secondary | ICD-10-CM | POA: Diagnosis not present

## 2023-02-17 DIAGNOSIS — H43813 Vitreous degeneration, bilateral: Secondary | ICD-10-CM | POA: Diagnosis not present

## 2023-02-17 DIAGNOSIS — L309 Dermatitis, unspecified: Secondary | ICD-10-CM | POA: Diagnosis not present

## 2023-02-17 DIAGNOSIS — H18513 Endothelial corneal dystrophy, bilateral: Secondary | ICD-10-CM | POA: Diagnosis not present

## 2023-02-17 DIAGNOSIS — Z961 Presence of intraocular lens: Secondary | ICD-10-CM | POA: Diagnosis not present

## 2023-02-20 ENCOUNTER — Ambulatory Visit: Payer: PPO

## 2023-02-21 DIAGNOSIS — N952 Postmenopausal atrophic vaginitis: Secondary | ICD-10-CM | POA: Diagnosis not present

## 2023-02-21 DIAGNOSIS — N905 Atrophy of vulva: Secondary | ICD-10-CM | POA: Diagnosis not present

## 2023-02-21 DIAGNOSIS — L900 Lichen sclerosus et atrophicus: Secondary | ICD-10-CM | POA: Diagnosis not present

## 2023-02-21 DIAGNOSIS — R102 Pelvic and perineal pain: Secondary | ICD-10-CM | POA: Diagnosis not present

## 2023-03-09 DIAGNOSIS — R102 Pelvic and perineal pain: Secondary | ICD-10-CM | POA: Diagnosis not present

## 2023-03-09 DIAGNOSIS — N952 Postmenopausal atrophic vaginitis: Secondary | ICD-10-CM | POA: Diagnosis not present

## 2023-03-09 DIAGNOSIS — N905 Atrophy of vulva: Secondary | ICD-10-CM | POA: Diagnosis not present

## 2023-03-09 DIAGNOSIS — L900 Lichen sclerosus et atrophicus: Secondary | ICD-10-CM | POA: Diagnosis not present

## 2023-03-14 DIAGNOSIS — L814 Other melanin hyperpigmentation: Secondary | ICD-10-CM | POA: Diagnosis not present

## 2023-03-14 DIAGNOSIS — L578 Other skin changes due to chronic exposure to nonionizing radiation: Secondary | ICD-10-CM | POA: Diagnosis not present

## 2023-03-14 DIAGNOSIS — L988 Other specified disorders of the skin and subcutaneous tissue: Secondary | ICD-10-CM | POA: Diagnosis not present

## 2023-03-14 DIAGNOSIS — C44222 Squamous cell carcinoma of skin of right ear and external auricular canal: Secondary | ICD-10-CM | POA: Diagnosis not present

## 2023-03-23 ENCOUNTER — Ambulatory Visit: Payer: PPO | Attending: Cardiovascular Disease | Admitting: Cardiovascular Disease

## 2023-03-23 ENCOUNTER — Encounter: Payer: Self-pay | Admitting: Cardiovascular Disease

## 2023-03-23 VITALS — BP 132/70 | HR 65 | Ht 63.0 in | Wt 171.6 lb

## 2023-03-23 DIAGNOSIS — I4891 Unspecified atrial fibrillation: Secondary | ICD-10-CM | POA: Diagnosis not present

## 2023-03-23 NOTE — Patient Instructions (Signed)
Medication Instructions:  No changes today *If you need a refill on your cardiac medications before your next appointment, please call your pharmacy*   Lab Work: none If you have labs (blood work) drawn today and your tests are completely normal, you will receive your results only by: MyChart Message (if you have MyChart) OR A paper copy in the mail If you have any lab test that is abnormal or we need to change your treatment, we will call you to review the results.   Testing/Procedures: none   Follow-Up: At [facility], you and your health needs are our priority.  As part of our continuing mission to provide you with exceptional heart care, we have created designated Provider Care Teams.  These Care Teams include your primary Cardiologist (physician) and Advanced Practice Providers (APPs -  Physician Assistants and Nurse Practitioners) who all work together to provide you with the care you need, when you need it.   Your next appointment:   6 month(s)  Provider:   Verne Carrow, MD     Other Instructions You have been referred to Cardiac Electrophysiology to discuss Watchman

## 2023-03-23 NOTE — Progress Notes (Addendum)
Chief Complaint  Patient presents with   New Patient (Initial Visit)    Atrial fibrillation   History of Present Illness: 82 yo female with history of atrial fibrillation, endometrial cancer, seizure disorder, GERD, hiatal hernia, prior hemorrhagic CVA, and prior PE in 1975 who is here today as a new consult, referred by Dr. Nehemiah Settle, for the evaluation of atrial fibrillation. She was diagnosed with atrial fibrillation in August 2024. She had a hemorrhagic CVA in 1996. She has not had further neurological issues since then. She was started on atenolol and Eliquis in August 2024. She is in atrial fibrillation today with heart rate of 65 bpm. She tells me that she feels well overall. She has noticed a little fatigue over the past few months. No chest pain or dyspnea. No LE edema, dizziness or awareness of palpitations.   Primary Care Physician: Renford Dills, MD  Past Medical History:  Diagnosis Date   Cancer [facility])    endometrial / melanome shoulder in 1999   Focal seizure (HCC) 11/07/2014   neurologist--- dr c. Anne Hahn;  started complex partial seizures post 2 wks cva in 1996;  now focal seizures, bilateral black spots visual fields that resolve about 10 min. , tend to happen with stress, no loc is aware of surrounding's,  controlled with topamax  (02-24-2021  pt stated last one approx 6 months ago)   GERD (gastroesophageal reflux disease)    Herpes genitalis    Hiatal hernia 2007   History of COVID-19 04/2019   per pt mild symptoms that resolved   History of hemorrhagic cerebrovascular accident (CVA) with residual deficit 1996   neurologist-- dr c. 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    Lichen sclerosus    labia   Osteopenia    Personal history of PE (pulmonary embolism) 1975   per pt post op breast augmentation/ BTL one week,  stated completed anticoagation, and none before or after   PMB (postmenopausal bleeding)    gyn-- dr Richardson Dopp   Pneumonia    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    Stroke [facility])    Hemorragic 1996   Thickened endometrium    Vitamin D deficiency    Wears glasses     Past Surgical History:  Procedure Laterality Date   APPENDECTOMY  1955   AUGMENTATION MAMMAPLASTY Bilateral 1975   silicone   BROW LIFT Bilateral 06/07/2019   Procedure: BLEPHAROPLASTY UPPER EYELID WITH EXCESS SKIN;  Surgeon: Imagene Riches, MD;  Location: [facility];  Service: Ophthalmology;  Laterality: Bilateral;   CATARACT EXTRACTION W/ INTRAOCULAR LENS IMPLANT Bilateral    left 06/ 2006;  right 08/ 2009   DILATATION & CURETTAGE/HYSTEROSCOPY WITH MYOSURE N/A 03/02/2021   Procedure: DILATATION & CURETTAGE/HYSTEROSCOPY WITH MYOSURE;  Surgeon: Gerald Leitz, MD;  Location: [facility];  Service: Gynecology;  Laterality: N/A;   DILATION AND CURETTAGE OF UTERUS  1972   MELANOMA EXCISION  07/1997   left shoulder   ROBOTIC ASSISTED TOTAL HYSTERECTOMY WITH BILATERAL SALPINGO OOPHERECTOMY N/A 03/25/2021   Procedure:  XI ROBOTIC ASSISTED TOTAL HYSTERECTOMY WITH BILATERAL SALPINGO OOPHORECTOMY;  Surgeon: Carver Fila, MD;  Location: [facility];  Service: Gynecology;  Laterality: N/A;   SENTINEL NODE BIOPSY N/A 03/25/2021   Procedure: SENTINEL NODE BIOPSY;  Surgeon: Carver Fila, MD;  Location: [facility];  Service: Gynecology;  Laterality: N/A;   thrombosed hemorrhoid  surgery     TONSILLECTOMY  1954   TUBAL LIGATION  1975   VARICOSE VEIN SURGERY  1969   right leg   VULVA /PERINEUM BIOPSY N/A 03/25/2021   Procedure: VULVAR BIOPSY;  Surgeon: Carver Fila, MD;  Location: [facility];  Service: Gynecology;  Laterality: N/A;    Current Outpatient Medications  Medication Sig Dispense Refill   atenolol (TENORMIN) 25 MG tablet Take 25 mg by mouth daily.     clobetasol cream (TEMOVATE) 0.05 % Apply 1 application topically 2 (two) times a week.     conjugated estrogens (PREMARIN) vaginal cream as directed Vaginal 1 time a day for 2 weeks then 2 times a week for 30 days     docusate sodium (COLACE) 100 MG capsule Take 100 mg by mouth daily as needed for mild constipation.     ELIQUIS 5 MG TABS tablet Take 5 mg by mouth 2 (two) times daily.     famotidine (PEPCID) 40 MG tablet Take 40 mg by mouth 2 (two) times daily.     fluconazole (DIFLUCAN) 150 MG tablet Take 1 tablet (150 mg total) by mouth daily. 1 tablet 0   furosemide (LASIX) 20 MG tablet Take 20 mg by mouth as needed (swelling).     levothyroxine (SYNTHROID) 50 MCG tablet Take 50 mcg by mouth every morning.     LORazepam (ATIVAN) 1 MG tablet Take 1 mg by mouth at bedtime.  1   omeprazole (PRILOSEC) 20 MG capsule Take 20 mg by mouth daily.     polyethylene glycol (MIRALAX / GLYCOLAX) 17 g packet Take 17 g by mouth daily.     Probiotic Product (PROBIOTIC DAILY PO) Take by mouth.     psyllium (METAMUCIL) 58.6 % packet Take 1 packet by mouth every other day.     senna-docusate (SENOKOT-S) 8.6-50 MG tablet Take 1 tablet by mouth daily.     topiramate (TOPAMAX) 100 MG tablet TAKE 1 TABLET BY MOUTH IN THE MORNING AND 2 IN THE EVENING 270 tablet 3   No current facility-administered medications for this visit.    Allergies  Allergen Reactions   Citalopram     Sleepy, dull feeling   Augmentin [Amoxicillin-Pot Clavulanate] Diarrhea    severe   Bacitracin Other (See Comments)   Doxycycline Diarrhea   Doxycycline Monohydrate Diarrhea   Moxifloxacin     Redness, non-healing (eye)   Moxifloxacin Hcl     Other reaction(s): redness (eyes)   Latex Rash    Bandaids/tape Other reaction(s): redness    Septra [Sulfamethoxazole-Trimethoprim] Rash   Tape Rash   Zithromax [Azithromycin] Rash    Social History   Socioeconomic History   Marital status: Married    Spouse name: Not on file   Number of children: 3   Years of education: 13   Highest education level: Not on file  Occupational History   Occupation: retired   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 [place]  Tobacco Use   Smoking status: Former    Current packs/day: 0.00    Types: Cigarettes    Start date: 09/26/1969    Quit date: 09/26/1989  Years since quitting: 33.5   Smokeless tobacco: Never  Vaping Use   Vaping status: Never Used  Substance and Sexual Activity   Alcohol use: Yes    Alcohol/week: 7.0 standard drinks of alcohol    Types: 7 Glasses of wine per week    Comment: Glass of wine per day   Drug use: Never   Sexual activity: Not Currently    Birth control/protection: Post-menopausal  Other Topics Concern   Not on file  Social History Narrative   Lives at home w/ her husband   Patient drinks 1-2 cups of caffeine daily.   Patient is right handed.   Social Determinants of Health   Financial Resource Strain: Not on file  Food Insecurity: Not on file  Transportation Needs: Not on file  Physical Activity: Not on file  Stress: Not on file  Social Connections: Not on file  Intimate Partner Violence: Not on file    Family History  Problem Relation Age of Onset   Heart attack Mother    Heart attack Father    Diabetes Father    Atrial fibrillation Father    Lymphoma Brother    Cancer Maternal Grandmother        esophageal   Seizures Neg Hx    Colon cancer Neg Hx    Breast cancer Neg Hx    Ovarian cancer Neg Hx    Endometrial cancer Neg Hx    Pancreatic cancer Neg Hx    Prostate cancer Neg Hx    Stroke Neg Hx     Review of Systems:  As stated in the HPI and otherwise negative.   BP 132/70   Pulse 65   Ht 5\' 3"  (1.6 m)   Wt 77.8 kg   SpO2 98%   BMI 30.40 kg/m   Physical  Examination: General: Well developed, well nourished, NAD  HEENT: OP clear, mucus membranes moist  SKIN: warm, dry. No rashes. Neuro: No focal deficits  Musculoskeletal: Muscle strength 5/5 all ext  Psychiatric: Mood and affect normal  Neck: No JVD, no carotid bruits, no thyromegaly, no lymphadenopathy.  Lungs:Clear bilaterally, no wheezes, rhonci, crackles Cardiovascular: Irreg irreg. No murmurs, gallops or rubs. Abdomen:Soft. Bowel sounds present. Non-tender.  Extremities: No lower extremity edema. Pulses are 2 + in the bilateral DP/PT.  EKG:  EKG is ordered today. The ekg ordered today demonstrates  EKG Interpretation Date/Time:  Thursday March 23 2023 13:01:03 EST Ventricular Rate:  65 PR Interval:    QRS Duration:  102 QT Interval:  398 QTC Calculation: 413 R Axis:   -71  Text Interpretation: Atrial fibrillation Left anterior fascicular block Minimal voltage criteria for LVH, may be normal variant ( Cornell product ) Septal infarct , age undetermined Confirmed by Verne Carrow [phone]) on 03/23/2023 1:02:17 PM    Recent Labs: No results found for requested labs within last 365 days.    Wt Readings from Last 3 Encounters:  03/23/23 77.8 kg  06/22/22 76.1 kg  05/27/22 74.7 kg    ssessment and Plan:   1. Atrial fibrillation: Rate controlled atrial fibrillation today. She is on atenolol and Eliquis. She is asymptomatic. CHADS VASC score 5 (female, age, prior stroke). I would recommend that she continue Eliquis for now but the question of this surrounds long term safety of use of anti-coagulation given her history of hemorrhagic stroke 28 years ago. She may be a candidate for Watchman. Will get records of recent echo done by outside group from [facility]  in August 2024.  I will ask Dr. Lalla Brothers or Dr. Jimmey Ralph to see her in the EP clinic to review candidacy for Watchman and to make any other recommendations for medical therapy changes or cardioversion.     Addendum 04/05/23: Echo report received from primary care. Echo August 2024 with LVEF=60-65%. Mild MR. Mild AI.  Verne Carrow   Labs/ tests ordered today include:  Orders Placed This Encounter  Procedures   Ambulatory referral to Cardiac Electrophysiology   EKG 12-Lead   Disposition:   Referral to EP  Signed, Verne Carrow, MD, [facility] 03/23/2023 1:56 PM    [facility] [address] [place], [place], [place]  [address] Phone: [phone]; Fax: [phone]

## 2023-03-24 DIAGNOSIS — L309 Dermatitis, unspecified: Secondary | ICD-10-CM | POA: Diagnosis not present

## 2023-03-24 DIAGNOSIS — L719 Rosacea, unspecified: Secondary | ICD-10-CM | POA: Diagnosis not present

## 2023-03-28 ENCOUNTER — Telehealth: Payer: Self-pay

## 2023-03-28 ENCOUNTER — Ambulatory Visit: Payer: PPO

## 2023-03-28 NOTE — Telephone Encounter (Signed)
Per Dr. Clifton James, scheduled the patient for Watchman consult with Dr. Steffanie Dunn 05/19/2023. She was grateful for call and agreed with plan.

## 2023-04-06 LAB — MOLECULAR PATHOLOGY

## 2023-04-25 ENCOUNTER — Ambulatory Visit
Admission: RE | Admit: 2023-04-25 | Discharge: 2023-04-25 | Disposition: A | Discharge status: Home / Self Care | Payer: PPO | Source: Ambulatory Visit | Attending: Obstetrics and Gynecology | Admitting: Obstetrics and Gynecology

## 2023-04-25 ENCOUNTER — Ambulatory Visit: Payer: PPO

## 2023-04-25 DIAGNOSIS — Z1231 Encounter for screening mammogram for malignant neoplasm of breast: Secondary | ICD-10-CM

## 2023-05-18 NOTE — Progress Notes (Signed)
Electrophysiology Office Note:    Date:  05/19/2023   ID:  [name], DOB April 17, 1941, MRN [id number]  [facility] Cardiologist:  Verne Carrow, MD  [facility] Electrophysiologist:  Lanier Prude, MD   Referring MD: Renford Dills, MD   Chief Complaint: Atrial fibrillation  History of Present Illness:    [name] is an 83 year old woman who I am seeing today for an evaluation of atrial fibrillation and possible left atrial appendage occlusion at the request of Dr. Clifton James.  The patient has a history of atrial fibrillation, endometrial cancer, seizure disorder, GERD, hemorrhagic stroke and provoked pulmonary embolism in 1975 after breast augmentation.  She was first diagnosed with atrial fibrillation in August 2024.  She has a history of hemorrhagic stroke in 1996.  In August 2024 she was started on Eliquis.  She is with her husband today in clinic.    Their past medical, social and family history was reviewed.   ROS:   Please see the history of present illness.    All other systems reviewed and are negative.  EKGs/Labs/Other Studies Reviewed:    The following studies were reviewed today:  March 23, 2023 EKG shows atrial fibrillation, ventricular rate 65 bpm, left anterior fascicular block       Physical Exam:    VS:  BP (!) 142/80 (BP Location: Left Arm, Patient Position: Sitting, Cuff Size: Normal)   Pulse 72   Ht 5\' 3"  (1.6 m)   Wt 169 lb (76.7 kg)   SpO2 96%   BMI 29.94 kg/m     Wt Readings from Last 3 Encounters:  05/19/23 169 lb (76.7 kg)  03/23/23 171 lb 9.6 oz (77.8 kg)  06/22/22 167 lb 12.8 oz (76.1 kg)     GEN: no distress CARD: Irregularly irregular, No MRG RESP: No IWOB. CTAB.        ASSESSMENT AND PLAN:    1. Atrial fibrillation, unspecified type (HCC)     #Persistent atrial fibrillation Rate controlled Unclear chronicity. On atenolol for rate control/blood pressure management  Given the patient's history  of intracranial hemorrhage and seizure disorder, she would like a stroke risk mitigation strategy that avoids long-term exposure anticoagulation which I think is very reasonable.  I discussed left atrial appendage occlusion and the Watchman procedure in detail during today's consultation.  I discussed the procedural details, its risks and recovery and likelihood of success.  She would like to proceed.  -----------  I have seen [name] in the office today who is being considered for a Watchman left atrial appendage closure device. I believe they will benefit from this procedure given their history of atrial fibrillation, CHA2DS2-VASc score of 5 and unadjusted ischemic stroke rate of 7.2% per year. Unfortunately, the patient is not felt to be a long term anticoagulation candidate secondary to history of intracranial hemorrhage and seizures. The patient's chart has been reviewed and I feel that they would be a candidate for short term oral anticoagulation after Watchman implant.   It is my belief that after undergoing a LAA closure procedure, [name] will not need long term anticoagulation which eliminates anticoagulation side effects and major bleeding risk.   Procedural risks for the Watchman implant have been reviewed with the patient including a 0.5% risk of stroke, <1% risk of perforation and <1% risk of device embolization. Other risks include bleeding, vascular damage, tamponade, worsening renal function, and death. The patient understands these risk and wishes to proceed.  The published clinical data on the safety and effectiveness of WATCHMAN include but are not limited to the following: - Holmes DR, Everlene Farrier, Sick P et al. for the PROTECT AF Investigators. Percutaneous closure of the left atrial appendage versus warfarin therapy for prevention of stroke in patients with atrial fibrillation: a randomised non-inferiority trial. Lancet 2009; 374: 534-42. Everlene Farrier, Doshi SK,  Isa Rankin D et al. on behalf of the PROTECT AF Investigators. Percutaneous Left Atrial Appendage Closure for Stroke Prophylaxis in Patients With Atrial Fibrillation 2.3-Year Follow-up of the PROTECT AF (Watchman Left Atrial Appendage System for Embolic Protection in Patients With Atrial Fibrillation) Trial. Circulation 2013; 127:720-729. - Alli O, Doshi S,  Kar S, Reddy VY, Sievert H et al. Quality of Life Assessment in the Randomized PROTECT AF (Percutaneous Closure of the Left Atrial Appendage Versus Warfarin Therapy for Prevention of Stroke in Patients With Atrial Fibrillation) Trial of Patients at Risk for Stroke With Nonvalvular Atrial Fibrillation. J Am Coll Cardiol 2013; 61:1790-8. Aline August DR, Mia Creek, Price M, Whisenant B, Sievert H, Doshi S, Huber K, Reddy V. Prospective randomized evaluation of the Watchman left atrial appendage Device in patients with atrial fibrillation versus long-term warfarin therapy; the PREVAIL trial. Journal of the [employer] of Cardiology, Vol. 4, No. 1, 2014, 1-11. - Kar S, Doshi SK, Sadhu A, Horton R, Osorio J et al. Primary outcome evaluation of a next-generation left atrial appendage closure device: results from the PINNACLE FLX trial. Circulation 2021;143(18)1754-1762.    After today's visit with the patient which was dedicated solely for shared decision making visit regarding LAA closure device, the patient decided to proceed with the LAA appendage closure procedure scheduled to be done in the near future at [facility]. Prior to the procedure, I would like to obtain a gated CT scan of the chest with contrast timed for PV/LA visualization.   Additionally, the patient will need an echocardiogram.  HAS-BLED score 3 Hypertension No  Abnormal renal and liver function (Dialysis, transplant, Cr >2.26 mg/dL /Cirrhosis or Bilirubin >2x Normal or AST/ALT/AP >3x Normal) No  Stroke Yes  Bleeding Yes  Labile INR (Unstable/high INR) No  Elderly (>65)  Yes  Drugs or alcohol (>= 8 drinks/week, anti-plt or NSAID) No   CHA2DS2-VASc Score = 5  The patient's score is based upon: CHF History: 0 HTN History: 0 Diabetes History: 0 Stroke History: 2 Vascular Disease History: 0 Age Score: 2 Gender Score: 1   She will continue Eliquis for now.   Signed, Rossie Muskrat. Lalla Brothers, MD, [facility], [facility] 05/19/2023 1:04 PM    Electrophysiology [facility]

## 2023-05-19 ENCOUNTER — Encounter: Payer: Self-pay | Admitting: Cardiology

## 2023-05-19 ENCOUNTER — Ambulatory Visit: Payer: PPO | Attending: Cardiology | Admitting: Cardiology

## 2023-05-19 VITALS — BP 142/80 | HR 72 | Ht 63.0 in | Wt 169.0 lb

## 2023-05-19 DIAGNOSIS — I4891 Unspecified atrial fibrillation: Secondary | ICD-10-CM | POA: Diagnosis not present

## 2023-05-19 NOTE — Patient Instructions (Signed)
Medication Instructions:  Your physician recommends that you continue on your current medications as directed. Please refer to the Current Medication list given to you today.  *If you need a refill on your cardiac medications before your next appointment, please call your pharmacy*  Testing/Procedures: Echocardiogram  Your physician has requested that you have an echocardiogram. Echocardiography is a painless test that uses sound waves to create images of your heart. It provides your doctor with information about the size and shape of your heart and how well your heart's chambers and valves are working. This procedure takes approximately one hour. There are no restrictions for this procedure. Please do NOT wear cologne, perfume, aftershave, or lotions (deodorant is allowed). Please arrive 15 minutes prior to your appointment time.  Cardiac CT Your physician has requested that you have cardiac CT. Cardiac computed tomography (CT) is a painless test that uses an x-ray machine to take clear, detailed pictures of your heart. For further information please visit [email or web address] Please follow instruction sheet as given.  Watchman Your physician has requested that you have Left atrial appendage (LAA) closure device implantation is a procedure to put a small device in the LAA of the heart. The LAA is a small sac in the wall of the heart's left upper chamber. Blood clots can form in this area. The device, Watchman closes the LAA to help prevent a blood clot and stroke.    Follow-Up: At [facility], you and your health needs are our priority.  As part of our continuing mission to provide you with exceptional heart care, we have created designated Provider Care Teams.  These Care Teams include your primary Cardiologist (physician) and Advanced Practice Providers (APPs -  Physician Assistants and Nurse Practitioners) who all work together to provide you with the care you need, when you need  it.  You will be contacted by Nurse Navigator, Karsten Fells to schedule your pre-procedure visit and procedure date. If you have any questions she can be reached at [phone].

## 2023-06-07 ENCOUNTER — Encounter: Payer: Self-pay | Admitting: Cardiology

## 2023-06-07 ENCOUNTER — Ambulatory Visit: Payer: PPO | Attending: Cardiology

## 2023-06-07 DIAGNOSIS — I4891 Unspecified atrial fibrillation: Secondary | ICD-10-CM

## 2023-06-07 LAB — ECHOCARDIOGRAM COMPLETE
AR max vel: 2.81 cm2
AV Area VTI: 3.12 cm2
AV Area mean vel: 3.03 cm2
AV Mean grad: 1 mm[Hg]
AV Peak grad: 1.5 mm[Hg]
Ao pk vel: 0.61 m/s
Calc EF: 45.8 %
S' Lateral: 3 cm
Single Plane A2C EF: 45.1 %
Single Plane A4C EF: 46 %

## 2023-06-22 ENCOUNTER — Ambulatory Visit: Payer: PPO | Admitting: Adult Health

## 2023-06-23 DIAGNOSIS — R946 Abnormal results of thyroid function studies: Secondary | ICD-10-CM | POA: Diagnosis not present

## 2023-06-26 ENCOUNTER — Ambulatory Visit (HOSPITAL_COMMUNITY): Payer: PPO

## 2023-06-29 ENCOUNTER — Ambulatory Visit: Payer: PPO | Admitting: Adult Health

## 2023-06-29 ENCOUNTER — Encounter: Payer: Self-pay | Admitting: Adult Health

## 2023-06-29 VITALS — BP 143/80 | HR 63 | Ht 64.0 in | Wt 175.0 lb

## 2023-06-29 DIAGNOSIS — R569 Unspecified convulsions: Secondary | ICD-10-CM | POA: Diagnosis not present

## 2023-06-29 DIAGNOSIS — I480 Paroxysmal atrial fibrillation: Secondary | ICD-10-CM | POA: Insufficient documentation

## 2023-06-29 NOTE — Patient Instructions (Signed)
 Your Plan:  Continue topamax      Thank you for coming to see [place] at [facility]. I hope we have been able to provide you high quality care today.  You may receive a patient satisfaction survey over the next few weeks. We would appreciate your feedback and comments so that we may continue to improve ourselves and the health of our patients.

## 2023-06-29 NOTE — Progress Notes (Signed)
 PATIENT: [name] DOB: [birth year]  REASON FOR VISIT: follow up HISTORY FROM: patient  Chief Complaint  Patient presents with   Follow-up    Patient in room #20 with her husband. Patient states she been doing well and her last episode was back in July of 2024.     HISTORY OF PRESENT ILLNESS: Today 06/29/23:  [name] is a 84 y.o. 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 Returns today for follow-up.  She reports that she has not had any additional seizure events since July 2024.  In the past her symptoms may have represented ocular migraine?  Nevertheless she has not wanted to adjust or change any medication.  She remains on Topamax 100 mg in the morning and 200 mg in the p.m. recently Diagnosed with a.fib and placed on eliquis but will be getting the watchman implant- waiting for cardiac CT before she can get implant.   06/22/22: [name] is a 83 y.o. 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 Returns today for follow-up. No additional seizure events. No ocular changes. Remains on Topamax 100 mg in the a.m. and 200 mg in the p.m.  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  She returns today for follow-up.   06/16/21: [name] is a 83 year old female with a history of left hemorrhagic stroke and subsequent seizures.  She returns today for follow-up.  She is currently on Topamax 100 mg in the a.m. and 200 mg in the p.m. at the last visit it was felt that some of her events that she was describing could be an ocular migraine however the patient did not want to change/add anything to her  treatment  plan. EEG has been normal.  Patient reports that since the last visit she has not had any episodes.  She has continued to do really well.  Returns today for follow-up.  12/15/20: [name] is a 83 year old female with a history of left  hemorrhagic stroke and subsequent seizures.  She returns today for follow-up.  At the last visit the patient had had several events with the only symptom began black spots in the visual field bilaterally.  These events always occurred after a stressful event.  We discussed adding on Keppra however the patient asked to wait until this visit as the medication was expensive.  She has continued on Topamax 100 mg in the morning and 200 mg in the evening.  Since our last visit she has had 3 other events.  The only symptom that she has is black spots in visual fields in both eyes.  She denies any trouble speaking with these events.  She states that she is completely aware of her surroundings and can have a conversation with her husband when it is happening.  She states after about 10 minutes it resolves.  The patient is hesitant to try any new medication.  Reviewing her notes from [facility] from 2015 and from Dr. Anne Hahn she was not reporting black spots but rather blurry vision.  Looking at our system and EEG has not been completed through our office-her last MRI of the brain was in 2013.  06/09/20: [name] is a 83 year old female with a history of left hemorrhagic stroke and subsequent seizures.  She returns today for follow-up.  She continues on Topamax 100 mg in the morning and 200 mg in the  evening.  At the last visit she was reporting black spots that  would appear in both eyes that lasted for several minutes.  She felt these events were different than her original events as she did not have trouble speaking.  She reports that the events that occurring with her eyes with the spots are very similar to what she had before.  She did see ophthalmology and her exam was relatively unremarkable with the exception of dry eyes and visual acuity.  She reports that she did not have any events from June to this past January.  She states in January she had just recovered from [facility] and she had the event that lasted for  approximately 15 minutes.  The only symptoms was black spots in both eyes no additional symptoms.  All of her events seem to occur after a stressful event.  The patient reports that prior to coming to our office she had tried 5 or 6 other seizure medications.  She is unsure of the name of these medications.  11/27/19: [name] is a 83 year old female with a history of left hemorrhagic stroke and subsequent seizures.  She returns today for follow-up.  She remains on Topamax.  She states that she has had 3 events since January.  She reports that these events were slightly different.  She states that they presented with black spots in front of her vision in both visual fields.  She states that this lasted approximately 10 minutes.  Denies these events being followed by headache.  During this time she did not have any trouble speaking.  No weakness in the arms or legs.  She states that this happened again in March and then again in April.  She is not had any subsequent events.  She states that she has been under a lot of stress over the last year.  Reports that her dad passed away and then her son unexpectedly passed.  She has a follow-up with ophthalmology next month and plans to discuss the symptoms as well.  She denies any additional strokelike symptoms.  She returns today for an evaluation.  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  She returns today for follow-up.  She remains on Topamax taking 100 mg in the morning and 200 mg in the evening.  She reports that she has had 2 events over the last year.  The first event was December 24, 2017.  She states that it started off with a bad headache and then she could not remember names.  It lasted for approximately 1 hour.  She does note that stressful events typically caused her episodes.  She states 1 week prior she was bitten by her dog and had to go to her PCP and was put  on antibiotics.  She states the second event was December 18 she states that she [place] her [place] [facility].  She states that she developed aphasia could not talk or understand what people are saying for approximately 30 minutes.  She states that 1 week prior her best friend had passed away.  Fortunately she is not had any episodes this year.  She states that she typically lets her husband drive.  If she does drive her husband is typically in the car with her.  She is able to complete all ADLs independently.  She returns today for follow-up.  REVIEW OF SYSTEMS: Out of  a complete 14 system review of symptoms, the patient complains only of the following symptoms, and all other reviewed systems are negative.  See HPI  ALLERGIES: Allergies  Allergen Reactions   Citalopram     Sleepy, dull feeling   Augmentin [Amoxicillin-Pot Clavulanate] Diarrhea    severe   Bacitracin Other (See Comments)   Doxycycline Diarrhea   Doxycycline Monohydrate Diarrhea   Moxifloxacin     Redness, non-healing (eye)   Moxifloxacin Hcl     Other reaction(s): redness (eyes)   Latex Rash    Bandaids/tape Other reaction(s): redness   Septra [Sulfamethoxazole-Trimethoprim] Rash   Tape Rash   Zithromax [Azithromycin] Rash    HOME MEDICATIONS: Outpatient Medications Prior to Visit  Medication Sig Dispense Refill   atenolol (TENORMIN) 25 MG tablet Take 25 mg by mouth daily.     clobetasol cream (TEMOVATE) 0.05 % Apply 1 application topically 2 (two) times a week.     conjugated estrogens (PREMARIN) vaginal cream as directed Vaginal 1 time a day for 2 weeks then 2 times a week for 30 days     docusate sodium (COLACE) 100 MG capsule Take 100 mg by mouth daily as needed for mild constipation.     ELIQUIS 5 MG TABS tablet Take 5 mg by mouth 2 (two) times daily.     famotidine (PEPCID) 40 MG tablet Take 40 mg by mouth 2 (two) times daily.     fluconazole (DIFLUCAN) 150 MG tablet Take 1 tablet (150 mg  total) by mouth daily. 1 tablet 0   furosemide (LASIX) 20 MG tablet Take 20 mg by mouth as needed (swelling).     hydrocortisone 2.5 % lotion Apply 2.5 % topically 2 (two) times daily.     LORazepam (ATIVAN) 1 MG tablet Take 1 mg by mouth at bedtime.  1   metroNIDAZOLE (METROGEL) 0.75 % gel Apply 1 Application topically 2 (two) times daily.     omeprazole (PRILOSEC) 20 MG capsule Take 20 mg by mouth daily.     Probiotic Product (PROBIOTIC DAILY PO) Take by mouth.     TOBRADEX ST 0.3-0.05 % SUSP Apply 1 drop to eye 3 (three) times daily.     topiramate (TOPAMAX) 100 MG tablet TAKE 1 TABLET BY MOUTH IN THE MORNING AND 2 IN THE EVENING 270 tablet 3   levothyroxine (SYNTHROID) 50 MCG tablet Take 50 mcg by mouth every morning. (Patient not taking: Reported on 06/29/2023)     polyethylene glycol (MIRALAX / GLYCOLAX) 17 g packet Take 17 g by mouth daily. (Patient not taking: Reported on 06/29/2023)     psyllium (METAMUCIL) 58.6 % packet Take 1 packet by mouth every other day. (Patient not taking: Reported on 05/19/2023)     senna-docusate (SENOKOT-S) 8.6-50 MG tablet Take 1 tablet by mouth daily. (Patient not taking: Reported on 06/29/2023)     No facility-administered medications prior to visit.    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 Anne Hahn;  started complex partial seizures post 2 wks cva in 1996;  now focal seizures, bilateral black spots visual fields that resolve about 10 min. , tend to happen with stress, no loc is aware of surrounding's,  controlled with topamax  (02-24-2021  pt stated last one approx 6 months ago)   GERD (gastroesophageal reflux disease)    Herpes genitalis  Hiatal hernia 2007   History of COVID-19 04/2019   per pt mild symptoms that resolved   History of hemorrhagic cerebrovascular accident (CVA) with residual deficit 1996    neurologist-- dr c. 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    Lichen sclerosus    labia   Osteopenia    Personal history of PE (pulmonary embolism) 1975   per pt post op breast augmentation/ BTL one week,  stated completed anticoagation, and none before or after   PMB (postmenopausal bleeding)    gyn-- dr Richardson Dopp   Pneumonia    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    Stroke (HCC)    Hemorragic 1996   Thickened endometrium    Vitamin D deficiency    Wears glasses     PAST SURGICAL HISTORY: Past Surgical History:  Procedure Laterality Date   APPENDECTOMY  1955   AUGMENTATION MAMMAPLASTY Bilateral 1975   silicone   BROW LIFT Bilateral 06/07/2019   Procedure: BLEPHAROPLASTY UPPER EYELID WITH EXCESS SKIN;  Surgeon: Imagene Riches, MD;  Location: [facility];  Service: Ophthalmology;  Laterality: Bilateral;   CATARACT EXTRACTION W/ INTRAOCULAR LENS IMPLANT Bilateral    left 06/ 2006;  right 08/ 2009   DILATATION & CURETTAGE/HYSTEROSCOPY WITH MYOSURE N/A 03/02/2021   Procedure: DILATATION & CURETTAGE/HYSTEROSCOPY WITH MYOSURE;  Surgeon: Gerald Leitz, MD;  Location: [facility];  Service: Gynecology;  Laterality: N/A;   DILATION AND CURETTAGE OF UTERUS  1972   MELANOMA EXCISION  07/1997   left shoulder   ROBOTIC ASSISTED TOTAL HYSTERECTOMY WITH BILATERAL SALPINGO OOPHERECTOMY N/A 03/25/2021   Procedure: XI ROBOTIC ASSISTED TOTAL HYSTERECTOMY WITH BILATERAL SALPINGO OOPHORECTOMY;  Surgeon: Carver Fila, MD;  Location: [facility];  Service: Gynecology;  Laterality:  N/A;   SENTINEL NODE BIOPSY N/A 03/25/2021   Procedure: SENTINEL NODE BIOPSY;  Surgeon: Carver Fila, MD;  Location: [facility];  Service: Gynecology;  Laterality: N/A;   thrombosed hemorrhoid  surgery     TONSILLECTOMY  1954   TUBAL LIGATION  1975   VARICOSE VEIN SURGERY  1969   right leg   VULVA /PERINEUM BIOPSY N/A 03/25/2021   Procedure: VULVAR BIOPSY;  Surgeon: Carver Fila, MD;  Location: [facility];  Service: Gynecology;  Laterality: N/A;    FAMILY HISTORY: Family History  Problem Relation Age of Onset   Heart attack Mother    Heart attack Father    Diabetes Father    Atrial fibrillation Father    Lymphoma Brother    Cancer Maternal Grandmother        esophageal   Seizures Neg Hx    Colon cancer Neg Hx    Breast cancer Neg Hx    Ovarian cancer Neg Hx    Endometrial cancer Neg Hx    Pancreatic cancer Neg Hx    Prostate cancer Neg Hx    Stroke Neg Hx     SOCIAL HISTORY: Social History   Socioeconomic History   Marital status: Married    Spouse name: Not on file   Number  of children: 3   Years of education: 13   Highest education level: Not on file  Occupational History   Occupation: retired   OccupationPublishing rights manager of group travel in [place]  Tobacco Use   Smoking status: Former    Current packs/day: 0.00    Types: Cigarettes    Start date: 09/26/1969    Quit date: 09/26/1989    Years since quitting: 33.7   Smokeless tobacco: Never  Vaping Use   Vaping status: Never Used  Substance and Sexual Activity   Alcohol use: Yes    Alcohol/week: 7.0 standard drinks of alcohol    Types: 7 Glasses of wine per week    Comment: Glass of wine per day   Drug use: Never   Sexual activity: Not Currently    Birth control/protection: Post-menopausal  Other Topics Concern   Not on file  Social History Narrative   Lives at home w/ her husband   Patient drinks 1-2 cups of caffeine daily.   Patient is right handed.   Social Drivers of Corporate investment banker  Strain: Not on file  Food Insecurity: Not on file  Transportation Needs: Not on file  Physical Activity: Not on file  Stress: Not on file  Social Connections: Not on file  Intimate Partner Violence: Not on file      PHYSICAL EXAM  Vitals:   06/29/23 1318 06/29/23 1323  BP:  (!) 143/80  Pulse:  63  Weight:  175 lb (79.4 kg)  Height: 5\' 3"  (1.6 m) 5\' 4"  (1.626 m)     Body mass index is 30.04 kg/m.  Generalized: Well developed, in no acute distress   Neurological examination  Mentation: Alert oriented to time, place, history taking. Follows all commands speech and language fluent Cranial nerve II-XII: Pupils were equal round reactive to light. Extraocular movements were full, visual field were full on confrontational test. Head turning and shoulder shrug  were normal and symmetric. Motor: The motor testing reveals 5 over 5 strength of all 4 extremities. Good symmetric motor tone is noted throughout.  Sensory: Sensory testing is intact to soft touch on all 4 extremities. No evidence of extinction is noted.  Coordination: Cerebellar testing reveals good finger-nose-finger and heel-to-shin bilaterally.  Gait and station: Gait is normal.    DIAGNOSTIC DATA (LABS, IMAGING, TESTING) - I reviewed patient records, labs, notes, testing and imaging myself where available.  Lab Results  Component Value Date   WBC 5.7 03/16/2021   HGB 14.3 03/16/2021   HCT 42.8 03/16/2021   MCV 98.6 03/16/2021   PLT 192 03/16/2021      Component Value Date/Time   NA 131 (L) 03/16/2021 1138   K 4.5 03/16/2021 1138   CL 103 03/16/2021 1138   CO2 23 03/16/2021 1138   GLUCOSE 102 (H) 03/16/2021 1138   BUN 15 03/16/2021 1138   CREATININE 0.78 03/16/2021 1138   CALCIUM 9.2 03/16/2021 1138   PROT 7.0 03/16/2021 1138   ALBUMIN 4.1 03/16/2021 1138   AST 204 (H) 03/16/2021 1138   ALT 628 (H) 03/16/2021 1138   ALKPHOS 397 (H) 03/16/2021 1138   BILITOT 0.6 03/16/2021 1138   GFRNONAA >60 03/16/2021  1138   GFRAA 109 09/01/2006 1405   Lab Results  Component Value Date   CHOL 235 (HH) 09/01/2006   HDL 75.5 09/01/2006   LDLDIRECT 135.4 09/01/2006   TRIG 67 09/01/2006   CHOLHDL 3.1 CALC 09/01/2006   Lab Results  Component Value Date  HGBA1C 5.4 09/01/2006   Lab Results  Component Value Date   VITAMINB12 208 (L) 09/05/2006   No results found for: "TSH"    ASSESSMENT AND PLAN 83 y.o. 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 here with :  Seizures  Continue Topamax 100 mg in the AM, 200 mg in the PM Advised if symptoms were or she develops new symptoms she should let us know Previously was a patient of Dr. Clarisa Kindred.  She will establish care with Dr. Teresa Coombs as her primary neurologist. Follow-up in 1 year  or sooner if needed    Butch Penny, MSN, NP-C 06/29/2023, 1:47 PM [facility] [address] [place], [place] [address] [phone]

## 2023-07-07 ENCOUNTER — Ambulatory Visit (HOSPITAL_COMMUNITY)
Admission: RE | Admit: 2023-07-07 | Discharge: 2023-07-07 | Disposition: A | Discharge status: Home / Self Care | Source: Ambulatory Visit | Attending: Cardiology | Admitting: Cardiology

## 2023-07-07 DIAGNOSIS — I4891 Unspecified atrial fibrillation: Secondary | ICD-10-CM | POA: Diagnosis not present

## 2023-07-07 MED ORDER — IOHEXOL 350 MG/ML SOLN
95.0000 mL | Freq: Once | INTRAVENOUS | Status: AC | PRN
  Administered 2023-07-07: 95 mL via INTRAVENOUS

## 2023-07-15 ENCOUNTER — Encounter: Payer: Self-pay | Admitting: Cardiology

## 2023-07-16 ENCOUNTER — Other Ambulatory Visit: Payer: Self-pay | Admitting: Adult Health

## 2023-07-17 ENCOUNTER — Telehealth: Payer: Self-pay

## 2023-07-17 NOTE — Telephone Encounter (Signed)
 Phyllis Ginger: Chicken wing anatomy Max 15.5/ AVG 14.2/ Depth 11.4 Likely use a 20mm device and TruSteer sheath Mid/Mid TSP RAO 33 CAU 37

## 2023-07-20 ENCOUNTER — Telehealth: Payer: Self-pay

## 2023-07-20 ENCOUNTER — Other Ambulatory Visit: Payer: Self-pay

## 2023-07-20 DIAGNOSIS — I4891 Unspecified atrial fibrillation: Secondary | ICD-10-CM

## 2023-07-20 NOTE — Telephone Encounter (Signed)
-----   Message from Rossie [facility] sent at 07/15/2023  4:12 PM EDT ----- OK to proceed with Watchman evaluation.  Sheria Lang T. Lalla Brothers, MD, [facility], [facility] Cardiac Electrophysiology

## 2023-07-20 NOTE — Telephone Encounter (Signed)
 Reviewed results with patient who verbalized understanding.   The patient wishes to proceed with LAAO on 09/21/2023. Will call to arrange labs closer to procedure and sent instructions via MyChart. She was grateful for call and agreed with plan.

## 2023-08-03 ENCOUNTER — Ambulatory Visit
Admission: RE | Admit: 2023-08-03 | Discharge: 2023-08-03 | Disposition: A | Discharge status: Home / Self Care | Payer: PPO | Source: Ambulatory Visit | Attending: Internal Medicine | Admitting: Internal Medicine

## 2023-08-03 DIAGNOSIS — M810 Age-related osteoporosis without current pathological fracture: Secondary | ICD-10-CM | POA: Diagnosis not present

## 2023-08-03 DIAGNOSIS — R946 Abnormal results of thyroid function studies: Secondary | ICD-10-CM | POA: Diagnosis not present

## 2023-08-03 DIAGNOSIS — M8580 Other specified disorders of bone density and structure, unspecified site: Secondary | ICD-10-CM

## 2023-08-16 DIAGNOSIS — L718 Other rosacea: Secondary | ICD-10-CM | POA: Diagnosis not present

## 2023-08-22 NOTE — Telephone Encounter (Signed)
 Scheduled patient for 6 month follow-up visit with Dr. Abel Hoe on 09/07/2023. She will have labs and EKG done for Watchman procedure 6/5. She was grateful for call and agreed with plan.

## 2023-08-31 DIAGNOSIS — E039 Hypothyroidism, unspecified: Secondary | ICD-10-CM | POA: Diagnosis not present

## 2023-09-07 ENCOUNTER — Ambulatory Visit: Attending: Cardiology | Admitting: Cardiovascular Disease

## 2023-09-07 ENCOUNTER — Encounter: Payer: Self-pay | Admitting: Cardiovascular Disease

## 2023-09-07 VITALS — BP 126/86 | HR 64 | Ht 63.0 in | Wt 175.8 lb

## 2023-09-07 DIAGNOSIS — I4891 Unspecified atrial fibrillation: Secondary | ICD-10-CM

## 2023-09-07 NOTE — Patient Instructions (Signed)
 Medication Instructions:  No changes *If you need a refill on your cardiac medications before your next appointment, please call your pharmacy*  Lab Work: Today go to the first floor lab for blood work (bmet, cbc)   Testing/Procedures: none  Follow-Up: At [facility], you and your health needs are our priority.  As part of our continuing mission to provide you with exceptional heart care, our providers are all part of one team.  This team includes your primary Cardiologist (physician) and Advanced Practice Providers or APPs (Physician Assistants and Nurse Practitioners) who all work together to provide you with the care you need, when you need it.  Your next appointment:   12 month(s)  Provider:   Antoinette Batman, MD     Other Instructions See letter provided today for instructions for upcoming procedure

## 2023-09-07 NOTE — Progress Notes (Signed)
 Chief Complaint  Patient presents with   Follow-up    Atrial fibrillation    History of Present Illness: 83 yo female with history of atrial fibrillation, endometrial cancer, seizure disorder, GERD, hiatal hernia, prior hemorrhagic CVA, and prior PE in 1975 who is here today for follow up. She was diagnosed with atrial fibrillation in August 2024. She had a hemorrhagic CVA in 1996. She has not had further neurological issues since then. She was started on atenolol and Eliquis in August 2024. Echo February 2025 with LVEF=55-60%. Mild to moderate mitral regurgitation. She has been seen by Dr. Marven Slimmer and is scheduled for placement of a Watchman left atrial appendage occlusion device on 09/21/23.   She is here today for follow up. The patient denies any chest pain, dyspnea, palpitations, lower extremity edema, orthopnea, PND, dizziness, near syncope or syncope.   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 Tilda Fogo;  started complex partial seizures post 2 wks cva in 1996;  now focal seizures, bilateral black spots visual fields that resolve about 10 min. , tend to happen with stress, no loc is aware of surrounding's,  controlled with topamax   (02-24-2021  pt stated last one approx 6 months ago)   GERD (gastroesophageal reflux disease)    Herpes genitalis    Hiatal hernia 2007   History of COVID-19 04/2019   per pt mild symptoms that resolved   History of hemorrhagic cerebrovascular accident (CVA) with residual deficit 1996   neurologist-- dr c. 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    Lichen sclerosus    labia   Osteopenia    Personal history of PE (pulmonary embolism) 1975   per pt post op breast augmentation/ BTL one week,  stated completed anticoagation, and none before or after   PMB (postmenopausal bleeding)    gyn-- dr Wynona Hedger   Pneumonia    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    Stroke [facility])    Hemorragic 1996   Thickened endometrium    Vitamin D deficiency    Wears glasses     Past Surgical History:  Procedure Laterality Date   APPENDECTOMY  1955   AUGMENTATION MAMMAPLASTY Bilateral 1975   silicone   BROW LIFT Bilateral 06/07/2019   Procedure: BLEPHAROPLASTY UPPER EYELID WITH EXCESS SKIN;  Surgeon: Zacarias Hermann, MD;  Location: [facility];  Service: Ophthalmology;  Laterality: Bilateral;   CATARACT EXTRACTION W/ INTRAOCULAR LENS IMPLANT Bilateral    left 06/ 2006;  right 08/ 2009   DILATATION & CURETTAGE/HYSTEROSCOPY WITH MYOSURE N/A 03/02/2021   Procedure: DILATATION & CURETTAGE/HYSTEROSCOPY WITH MYOSURE;  Surgeon: Arlee Lace, MD;  Location: [facility];  Service: Gynecology;  Laterality: N/A;   DILATION AND CURETTAGE OF UTERUS  1972   MELANOMA EXCISION  07/1997   left shoulder   ROBOTIC ASSISTED TOTAL HYSTERECTOMY WITH BILATERAL SALPINGO OOPHERECTOMY N/A 03/25/2021  Procedure: XI ROBOTIC ASSISTED TOTAL HYSTERECTOMY WITH BILATERAL SALPINGO OOPHORECTOMY;  Surgeon: Suzi Essex, MD;  Location: [facility];  Service: Gynecology;  Laterality: N/A;   SENTINEL NODE BIOPSY N/A 03/25/2021   Procedure: SENTINEL NODE BIOPSY;  Surgeon: Suzi Essex, MD;  Location: [facility];  Service: Gynecology;  Laterality: N/A;   thrombosed hemorrhoid  surgery     TONSILLECTOMY  1954   TUBAL LIGATION  1975   VARICOSE VEIN SURGERY  1969   right leg   VULVA /PERINEUM BIOPSY N/A 03/25/2021    Procedure: VULVAR BIOPSY;  Surgeon: Suzi Essex, MD;  Location: [facility];  Service: Gynecology;  Laterality: N/A;    Current Outpatient Medications  Medication Sig Dispense Refill   atenolol (TENORMIN) 25 MG tablet Take 25 mg by mouth daily.     clobetasol cream (TEMOVATE) 0.05 % Apply 1 application topically 2 (two) times a week.     conjugated estrogens (PREMARIN) vaginal cream as directed Vaginal 1 time a day for 2 weeks then 2 times a week for 30 days     docusate sodium (COLACE) 100 MG capsule Take 100 mg by mouth daily as needed for mild constipation.     doxycycline (PERIOSTAT) 20 MG tablet Take 20 mg by mouth 2 (two) times daily.     ELIQUIS 5 MG TABS tablet Take 5 mg by mouth 2 (two) times daily.     famotidine (PEPCID) 40 MG tablet Take 40 mg by mouth 2 (two) times daily.     fluconazole  (DIFLUCAN ) 150 MG tablet Take 1 tablet (150 mg total) by mouth daily. 1 tablet 0   furosemide (LASIX) 20 MG tablet Take 20 mg by mouth as needed (swelling).     LORazepam (ATIVAN) 1 MG tablet Take 1 mg by mouth at bedtime.  1   metroNIDAZOLE (METROGEL) 0.75 % gel Apply 1 Application topically 2 (two) times daily.     omeprazole (PRILOSEC) 20 MG capsule Take 20 mg by mouth daily.     Probiotic Product (PROBIOTIC DAILY PO) Take by mouth.     topiramate  (TOPAMAX ) 100 MG tablet TAKE 1 TABLET BY MOUTH IN THE MORNING AND 2 IN THE EVENING 270 tablet 3   No current facility-administered medications for this visit.    Allergies  Allergen Reactions   Citalopram     Sleepy, dull feeling   Augmentin [Amoxicillin-Pot Clavulanate] Diarrhea    severe   Bacitracin  Other (See Comments)   Doxycycline Diarrhea   Doxycycline Monohydrate Diarrhea   Moxifloxacin     Redness, non-healing (eye)   Moxifloxacin Hcl     Other reaction(s): redness (eyes)   Latex Rash    Bandaids/tape Other reaction(s): redness   Septra [Sulfamethoxazole-Trimethoprim] Rash   Tape Rash   Zithromax [Azithromycin] Rash     Social History   Socioeconomic History   Marital status: Married    Spouse name: Not on file   Number of children: 3   Years of education: 13   Highest education level: Not on file  Occupational History   Occupation: retired   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 [place]   Tobacco Use   Smoking status: Former    Current packs/day: 0.00    Types: Cigarettes    Start date: 09/26/1969    Quit date: 09/26/1989    Years since quitting: 33.9   Smokeless tobacco: Never  Vaping Use   Vaping status: Never Used  Substance and Sexual Activity   Alcohol use: Yes  Alcohol/week: 7.0 standard drinks of alcohol    Types: 7 Glasses of wine per week    Comment: Glass of wine per day   Drug use: Never   Sexual activity: Not Currently    Birth control/protection: Post-menopausal  Other Topics Concern   Not on file  Social History Narrative   Lives at home w/ her husband   Patient drinks 1-2 cups of caffeine daily.   Patient is right handed.   Social Drivers of Corporate investment banker Strain: Not on file  Food Insecurity: Not on file  Transportation Needs: Not on file  Physical Activity: Not on file  Stress: Not on file  Social Connections: Not on file  Intimate Partner Violence: Not on file    Family History  Problem Relation Age of Onset   Heart attack Mother    Heart attack Father    Diabetes Father    Atrial fibrillation Father    Lymphoma Brother    Cancer Maternal Grandmother        esophageal   Seizures Neg Hx    Colon cancer Neg Hx    Breast cancer Neg Hx    Ovarian cancer Neg Hx    Endometrial cancer Neg Hx    Pancreatic cancer Neg Hx    Prostate cancer Neg Hx    Stroke Neg Hx     Review of Systems:  As stated in the HPI and otherwise negative.   BP 126/86   Pulse 64   Ht 5\' 3"  (1.6 m)   Wt 175 lb 12.8 oz (79.7 kg)   SpO2 99%   BMI 31.14 kg/m   Physical Examination: General: Well developed, well nourished, NAD  HEENT: OP clear, mucus  membranes moist  SKIN: warm, dry. No rashes. Neuro: No focal deficits  Musculoskeletal: Muscle strength 5/5 all ext  Psychiatric: Mood and affect normal  Neck: No JVD, no carotid bruits, no thyromegaly, no lymphadenopathy.  Lungs:Clear bilaterally, no wheezes, rhonci, crackles Cardiovascular: Regular rate and rhythm. No murmurs, gallops or rubs. Abdomen:Soft. Bowel sounds present. Non-tender.  Extremities: No lower extremity edema. Pulses are 2 + in the bilateral DP/PT.  EKG:  EKG is ordered today. The ekg ordered today demonstrates  EKG Interpretation Date/Time:  Thursday Sep 07 2023 10:47:50 EDT Ventricular Rate:  64 PR Interval:    QRS Duration:  108 QT Interval:  430 QTC Calculation: 443 R Axis:   -59  Text Interpretation: Atrial fibrillation Incomplete right bundle branch block Left anterior fascicular block Moderate voltage criteria for LVH, may be normal variant ( R in aVL , Cornell product ) Poor R wave progression Confirmed by Antoinette Batman [phone]) on 09/07/2023 10:52:47 AM    Recent Labs: No results found for requested labs within last 365 days.    Wt Readings from Last 3 Encounters:  09/07/23 175 lb 12.8 oz (79.7 kg)  06/29/23 175 lb (79.4 kg)  05/19/23 169 lb (76.7 kg)    ssessment and Plan:   1. Atrial fibrillation: She is in rate controlled atrial fib today. She is on atenolol and Eliquis. She is asymptomatic. CHADS VASC score 5 (female, age, prior stroke). Planning for Watchman device in May 2025 per Dr. Marven Slimmer. (High bleeding risk on Eliquis with prior hemorrhagic stroke).  Continue Eliquisfor now.  Continue atenolol.   Labs/ tests ordered today include:  Orders Placed This Encounter  Procedures   CBC   Basic metabolic panel with GFR   EKG 47-WGNF   Disposition: Follow  up with me in one year  Signed, Antoinette Batman, MD, [facility] 09/07/2023 11:57 AM    [facility] [address] [place], [place], [place]  [address] Phone: [phone]; Fax: [phone]

## 2023-09-08 LAB — BASIC METABOLIC PANEL WITH GFR
BUN/Creatinine Ratio: 22 (ref 12–28)
BUN: 21 mg/dL (ref 8–27)
CO2: 19 mmol/L — ABNORMAL LOW (ref 20–29)
Calcium: 9.4 mg/dL (ref 8.7–10.3)
Chloride: 95 mmol/L — ABNORMAL LOW (ref 96–106)
Creatinine, Ser: 0.95 mg/dL (ref 0.57–1.00)
Glucose: 92 mg/dL (ref 70–99)
Potassium: 5.5 mmol/L — ABNORMAL HIGH (ref 3.5–5.2)
Sodium: 130 mmol/L — ABNORMAL LOW (ref 134–144)
eGFR: 61 mL/min/{1.73_m2} (ref >59)

## 2023-09-08 LAB — CBC
Hematocrit: 44.2 % (ref 34.0–46.6)
Hemoglobin: 14.5 g/dL (ref 11.1–15.9)
MCH: 32.2 pg (ref 26.6–33.0)
MCHC: 32.8 g/dL (ref 31.5–35.7)
MCV: 98 fL — ABNORMAL HIGH (ref 79–97)
Platelets: 194 10*3/uL (ref 150–450)
RBC: 4.51 x10E6/uL (ref 3.77–5.28)
RDW: 12.5 % (ref 11.7–15.4)
WBC: 6.8 10*3/uL (ref 3.4–10.8)

## 2023-09-13 ENCOUNTER — Ambulatory Visit: Payer: Self-pay | Admitting: Cardiology

## 2023-09-13 DIAGNOSIS — I4891 Unspecified atrial fibrillation: Secondary | ICD-10-CM

## 2023-09-13 NOTE — Telephone Encounter (Signed)
 Spoke with the patient. Reviewed labs in detail. Reviewed high potassium foods to avoid. She understood to stay hydrated and to have labs drawn next Tuesday. She was grateful for assistance.

## 2023-09-18 ENCOUNTER — Telehealth: Payer: Self-pay

## 2023-09-18 NOTE — Telephone Encounter (Signed)
 [name]

## 2023-09-19 DIAGNOSIS — I4891 Unspecified atrial fibrillation: Secondary | ICD-10-CM | POA: Diagnosis not present

## 2023-09-20 ENCOUNTER — Telehealth: Payer: Self-pay

## 2023-09-20 LAB — BASIC METABOLIC PANEL WITH GFR
BUN/Creatinine Ratio: 22 (ref 12–28)
BUN: 19 mg/dL (ref 8–27)
CO2: 20 mmol/L (ref 20–29)
Calcium: 9.8 mg/dL (ref 8.7–10.3)
Chloride: 97 mmol/L (ref 96–106)
Creatinine, Ser: 0.87 mg/dL (ref 0.57–1.00)
Glucose: 79 mg/dL (ref 70–99)
Potassium: 4.3 mmol/L (ref 3.5–5.2)
Sodium: 133 mmol/L — ABNORMAL LOW (ref 134–144)
eGFR: 67 mL/min/{1.73_m2} (ref >59)

## 2023-09-20 NOTE — Telephone Encounter (Signed)
 Confirmed procedure date of 09/21/2023. Confirmed arrival time of 1100 for procedure time at 1330. Reviewed pre-procedure instructions with patient. She said Dr. Abel Hoe told her she can take Topamax  the AM of her procedure. Informed her that is fine. Confirmed she does not have a contrast allergy. Confirmed she does not have a PPM or defibrillator. The patient understands to call if questions/concerns arise prior to procedure. She was grateful for call and and agreed with plan.

## 2023-09-21 ENCOUNTER — Inpatient Hospital Stay (HOSPITAL_COMMUNITY)

## 2023-09-21 ENCOUNTER — Inpatient Hospital Stay (HOSPITAL_COMMUNITY)
Admission: RE | Admit: 2023-09-21 | Discharge: 2023-09-22 | DRG: 274 | Disposition: A | Discharge status: Home / Self Care | Attending: Cardiology | Admitting: Cardiology

## 2023-09-21 ENCOUNTER — Encounter (HOSPITAL_COMMUNITY): Payer: Self-pay | Admitting: Cardiology

## 2023-09-21 ENCOUNTER — Other Ambulatory Visit: Payer: Self-pay

## 2023-09-21 ENCOUNTER — Encounter (HOSPITAL_COMMUNITY)
Admission: RE | Disposition: A | Discharge status: Home / Self Care | Payer: Self-pay | Source: Home / Self Care | Attending: Cardiology

## 2023-09-21 DIAGNOSIS — I700 Atherosclerosis of aorta: Secondary | ICD-10-CM | POA: Diagnosis not present

## 2023-09-21 DIAGNOSIS — N840 Polyp of corpus uteri: Secondary | ICD-10-CM

## 2023-09-21 DIAGNOSIS — Z006 Encounter for examination for normal comparison and control in clinical research program: Secondary | ICD-10-CM

## 2023-09-21 DIAGNOSIS — G40909 Epilepsy, unspecified, not intractable, without status epilepticus: Secondary | ICD-10-CM | POA: Diagnosis present

## 2023-09-21 DIAGNOSIS — Z7902 Long term (current) use of antithrombotics/antiplatelets: Secondary | ICD-10-CM

## 2023-09-21 DIAGNOSIS — I4891 Unspecified atrial fibrillation: Secondary | ICD-10-CM

## 2023-09-21 DIAGNOSIS — I4819 Other persistent atrial fibrillation: Principal | ICD-10-CM | POA: Diagnosis present

## 2023-09-21 DIAGNOSIS — Z8542 Personal history of malignant neoplasm of other parts of uterus: Secondary | ICD-10-CM

## 2023-09-21 DIAGNOSIS — K219 Gastro-esophageal reflux disease without esophagitis: Secondary | ICD-10-CM | POA: Diagnosis present

## 2023-09-21 DIAGNOSIS — Z86711 Personal history of pulmonary embolism: Secondary | ICD-10-CM

## 2023-09-21 DIAGNOSIS — Z7901 Long term (current) use of anticoagulants: Secondary | ICD-10-CM | POA: Diagnosis not present

## 2023-09-21 DIAGNOSIS — Z8673 Personal history of transient ischemic attack (TIA), and cerebral infarction without residual deficits: Secondary | ICD-10-CM | POA: Diagnosis not present

## 2023-09-21 DIAGNOSIS — Z87891 Personal history of nicotine dependence: Secondary | ICD-10-CM

## 2023-09-21 DIAGNOSIS — I10 Essential (primary) hypertension: Secondary | ICD-10-CM | POA: Diagnosis present

## 2023-09-21 DIAGNOSIS — Z95818 Presence of other cardiac implants and grafts: Secondary | ICD-10-CM | POA: Insufficient documentation

## 2023-09-21 DIAGNOSIS — Z01818 Encounter for other preprocedural examination: Secondary | ICD-10-CM | POA: Diagnosis not present

## 2023-09-21 HISTORY — PX: TRANSESOPHAGEAL ECHOCARDIOGRAM (CATH LAB): EP1270

## 2023-09-21 HISTORY — PX: LEFT ATRIAL APPENDAGE OCCLUSION: EP1229

## 2023-09-21 HISTORY — DX: Presence of other cardiac implants and grafts: Z95.818

## 2023-09-21 LAB — SURGICAL PCR SCREEN
MRSA, PCR: NEGATIVE
Staphylococcus aureus: NEGATIVE

## 2023-09-21 LAB — TYPE AND SCREEN
ABO/RH(D): A POS
Antibody Screen: NEGATIVE

## 2023-09-21 LAB — POCT ACTIVATED CLOTTING TIME: Activated Clotting Time: 354 s

## 2023-09-21 LAB — ECHO TEE

## 2023-09-21 MED ORDER — SODIUM CHLORIDE 0.9 % IV SOLN: 250.0000 mL | INTRAVENOUS | Status: DC | PRN

## 2023-09-21 MED ORDER — PROTAMINE SULFATE 10 MG/ML IV SOLN
INTRAVENOUS | Status: DC | PRN
  Administered 2023-09-21: 30 mg via INTRAVENOUS

## 2023-09-21 MED ORDER — ESMOLOL HCL 100 MG/10ML IV SOLN
INTRAVENOUS | Status: DC | PRN
  Administered 2023-09-21: 30 mg via INTRAVENOUS
  Administered 2023-09-21: 40 mg via INTRAVENOUS

## 2023-09-21 MED ORDER — LIDOCAINE 2% (20 MG/ML) 5 ML SYRINGE
INTRAMUSCULAR | Status: DC | PRN
  Administered 2023-09-21: 80 mg via INTRAVENOUS

## 2023-09-21 MED ORDER — FENTANYL CITRATE (PF) 100 MCG/2ML IJ SOLN
INTRAMUSCULAR | Status: AC
  Filled 2023-09-21: qty 2

## 2023-09-21 MED ORDER — VANCOMYCIN HCL IN DEXTROSE 1-5 GM/200ML-% IV SOLN
1000.0000 mg | INTRAVENOUS | Status: AC
  Administered 2023-09-21: 1000 mg via INTRAVENOUS
  Filled 2023-09-21: qty 200

## 2023-09-21 MED ORDER — APIXABAN 5 MG PO TABS
5.0000 mg | ORAL_TABLET | Freq: Two times a day (BID) | ORAL | Status: DC
  Administered 2023-09-22: 5 mg via ORAL
  Filled 2023-09-21: qty 1

## 2023-09-21 MED ORDER — HEPARIN (PORCINE) IN NACL 1000-0.9 UT/500ML-% IV SOLN
INTRAVENOUS | Status: DC | PRN
  Administered 2023-09-21: 500 mL

## 2023-09-21 MED ORDER — SODIUM CHLORIDE 0.9% FLUSH
3.0000 mL | INTRAVENOUS | Status: DC | PRN
Start: 2023-09-21 — End: 2023-09-22

## 2023-09-21 MED ORDER — ROCURONIUM BROMIDE 10 MG/ML (PF) SYRINGE
PREFILLED_SYRINGE | INTRAVENOUS | Status: DC | PRN
  Administered 2023-09-21: 60 mg via INTRAVENOUS
  Administered 2023-09-21: 20 mg via INTRAVENOUS

## 2023-09-21 MED ORDER — SODIUM CHLORIDE 0.9 % IV SOLN: INTRAVENOUS | Status: DC

## 2023-09-21 MED ORDER — ONDANSETRON HCL 4 MG/2ML IJ SOLN: 4.0000 mg | Freq: Four times a day (QID) | INTRAMUSCULAR | Status: DC | PRN

## 2023-09-21 MED ORDER — PANTOPRAZOLE SODIUM 40 MG PO TBEC
40.0000 mg | DELAYED_RELEASE_TABLET | Freq: Every day | ORAL | Status: DC
  Filled 2023-09-21: qty 1

## 2023-09-21 MED ORDER — PROPOFOL 10 MG/ML IV BOLUS
INTRAVENOUS | Status: DC | PRN
Start: 2023-09-21 — End: 2023-09-21
  Administered 2023-09-21: 100 mg via INTRAVENOUS
  Administered 2023-09-21: 30 mg via INTRAVENOUS

## 2023-09-21 MED ORDER — FUROSEMIDE 20 MG PO TABS
20.0000 mg | ORAL_TABLET | Freq: Every day | ORAL | Status: DC
  Filled 2023-09-21 (×3): qty 1

## 2023-09-21 MED ORDER — FENTANYL CITRATE (PF) 250 MCG/5ML IJ SOLN
INTRAMUSCULAR | Status: DC | PRN
Start: 2023-09-21 — End: 2023-09-21
  Administered 2023-09-21: 100 ug via INTRAVENOUS

## 2023-09-21 MED ORDER — IOHEXOL 350 MG/ML SOLN
INTRAVENOUS | Status: DC | PRN
  Administered 2023-09-21: 20 mL

## 2023-09-21 MED ORDER — SODIUM CHLORIDE 0.9% FLUSH: 3.0000 mL | Freq: Two times a day (BID) | INTRAVENOUS | Status: DC

## 2023-09-21 MED ORDER — APIXABAN 5 MG PO TABS
ORAL_TABLET | ORAL | Status: AC
  Administered 2023-09-21: 5 mg via ORAL
  Filled 2023-09-21: qty 1

## 2023-09-21 MED ORDER — TOPIRAMATE 25 MG PO TABS: 200.0000 mg | ORAL_TABLET | Freq: Every day | ORAL | Status: DC

## 2023-09-21 MED ORDER — ONDANSETRON HCL 4 MG/2ML IJ SOLN
INTRAMUSCULAR | Status: DC | PRN
Start: 2023-09-21 — End: 2023-09-21
  Administered 2023-09-21: 4 mg via INTRAVENOUS

## 2023-09-21 MED ORDER — HEPARIN SODIUM (PORCINE) 1000 UNIT/ML IJ SOLN
INTRAMUSCULAR | Status: DC | PRN
Start: 2023-09-21 — End: 2023-09-21
  Administered 2023-09-21: 12000 [IU] via INTRAVENOUS

## 2023-09-21 MED ORDER — SUGAMMADEX SODIUM 200 MG/2ML IV SOLN
INTRAVENOUS | Status: DC | PRN
  Administered 2023-09-21: 200 mg via INTRAVENOUS

## 2023-09-21 MED ORDER — CHLORHEXIDINE GLUCONATE 0.12 % MT SOLN
OROMUCOSAL | Status: AC
  Administered 2023-09-21: 15 mL
  Filled 2023-09-21: qty 15

## 2023-09-21 MED ORDER — ATENOLOL 25 MG PO TABS
25.0000 mg | ORAL_TABLET | Freq: Every day | ORAL | Status: DC
  Filled 2023-09-21: qty 1

## 2023-09-21 MED ORDER — TOPIRAMATE 25 MG PO TABS
100.0000 mg | ORAL_TABLET | Freq: Every day | ORAL | Status: DC
  Filled 2023-09-21: qty 4

## 2023-09-21 MED ORDER — ACETAMINOPHEN 325 MG PO TABS: 650.0000 mg | ORAL_TABLET | ORAL | Status: DC | PRN

## 2023-09-21 SURGICAL SUPPLY — 26 items
BLANKET WARM UNDERBOD FULL ACC (MISCELLANEOUS) ×1 IMPLANT
CATH INFINITI 5FR ANG PIGTAIL (CATHETERS) IMPLANT
CATH NUVISION NON NAV ICE (CATHETERS) IMPLANT
CATH WATCHMAN STEER ACCESS SYS (CATHETERS) IMPLANT
CLOSURE PERCLOSE PROSTYLE (VASCULAR PRODUCTS) IMPLANT
COVER SWIFTLINK CONNECTOR (BAG) IMPLANT
DEVICE WATCHMAN FLX PRO PROC (KITS) IMPLANT
DEVICE WATCHMAN TRUSTEER PROC (KITS) IMPLANT
GUIDEWIRE INQWIRE 1.5J.035X260 (WIRE) ×1 IMPLANT
INTRODUCER PERFORM 12 30 .038 (SHEATH) IMPLANT
KIT HEART LEFT (KITS) ×1 IMPLANT
KIT VERSACROSS CON 12FR 85 (KITS) IMPLANT
PACK CARDIAC CATHETERIZATION (CUSTOM PROCEDURE TRAY) ×1 IMPLANT
PAD DEFIB RADIO PHYSIO CONN (PAD) ×1 IMPLANT
SHEATH DILAT COONS TAPER 22F (SHEATH) IMPLANT
SHEATH PERFORMER 18FRX30 (VASCULAR PRODUCTS) IMPLANT
SHEATH PINNACLE 8F 10CM (SHEATH) IMPLANT
SHEATH PROBE COVER 6X72 (BAG) ×1 IMPLANT
SHIELD RADPAD SCOOP 12X17 (MISCELLANEOUS) ×1 IMPLANT
SYR CONTROL 10ML ANGIOGRAPHIC (SYRINGE) IMPLANT
TRANSDUCER W/STOPCOCK (MISCELLANEOUS) ×1 IMPLANT
TUBING CIL FLEX 10 FLL-RA (TUBING) ×1 IMPLANT
WATCHMAN FLX PRO 24 (Prosthesis & Implant Heart) IMPLANT
WATCHMAN FLX PRO PROCEDURE (KITS) ×1 IMPLANT
WATCHMAN TRUSTEER PROCEDURE (KITS) ×1 IMPLANT
WIRE HITORQ VERSACORE ST 145CM (WIRE) IMPLANT

## 2023-09-21 NOTE — Anesthesia Preprocedure Evaluation (Signed)
 Anesthesia Evaluation  Patient identified by MRN, date of birth, ID band Patient awake    Reviewed: Allergy & Precautions, H&P , NPO status , Patient's Chart, lab work & pertinent test results  Airway Mallampati: II  TM Distance: >3 FB Neck ROM: Full    Dental no notable dental hx.    Pulmonary neg sleep apnea, former smoker, PE   Pulmonary exam normal breath sounds clear to auscultation       Cardiovascular Normal cardiovascular exam+ dysrhythmias Atrial Fibrillation  Rhythm:Regular Rate:Normal     Neuro/Psych Seizures -,  CVA  negative psych ROS   GI/Hepatic Neg liver ROS, hiatal hernia,GERD  ,,  Endo/Other  negative endocrine ROS    Renal/GU negative Renal ROS  negative genitourinary   Musculoskeletal negative musculoskeletal ROS (+)    Abdominal   Peds negative pediatric ROS (+)  Hematology negative hematology ROS (+)   Anesthesia Other Findings   Reproductive/Obstetrics negative OB ROS                             Anesthesia Physical Anesthesia Plan  ASA: 3  Anesthesia Plan: General   Post-op Pain Management:    Induction: Intravenous  PONV Risk Score and Plan: 3 and Ondansetron , Dexamethasone  and Treatment may vary due to age or medical condition  Airway Management Planned: Oral ETT  Additional Equipment: None  Intra-op Plan:   Post-operative Plan: Extubation in OR  Informed Consent: I have reviewed the patients History and Physical, chart, labs and discussed the procedure including the risks, benefits and alternatives for the proposed anesthesia with the patient or authorized representative who has indicated his/her understanding and acceptance.     Dental advisory given  Plan Discussed with: CRNA  Anesthesia Plan Comments:        Anesthesia Quick Evaluation

## 2023-09-21 NOTE — Progress Notes (Signed)
  Echocardiogram Echocardiogram Transesophageal has been performed.  [name] 09/21/2023, 4:01 PM

## 2023-09-21 NOTE — Transfer of Care (Signed)
 Immediate Anesthesia Transfer of Care Note  Patient: [name]  Procedure(s) Performed: LEFT ATRIAL APPENDAGE OCCLUSION TRANSESOPHAGEAL ECHOCARDIOGRAM  Patient Location: Cath Lab  Anesthesia Type:General  Level of Consciousness: drowsy and patient cooperative  Airway & Oxygen Therapy: Patient Spontanous Breathing and Patient connected to nasal cannula oxygen  Post-op Assessment: Report given to RN and Post -op Vital signs reviewed and stable  Post vital signs: Reviewed and stable  Last Vitals:  Vitals Value Taken Time  BP 130/67   Temp 36.5 C   Pulse 87 09/21/23 1545  Resp 14 09/21/23 1545  SpO2 97 % 09/21/23 1545  Vitals shown include unfiled device data.  Last Pain:  Vitals:   09/21/23 1136  TempSrc: Oral  PainSc: 0-No pain         Complications: There were no known notable events for this encounter.

## 2023-09-21 NOTE — H&P (Signed)
 Electrophysiology Office Note:     Date:  09/21/2023    ID:  [name], DOB 1940-08-20, MRN [id number]   [facility] Cardiologist:  Antoinette Batman, MD  [facility] Electrophysiologist:  Boyce Byes, MD    Referring MD: Merl Star, MD    Chief Complaint: Atrial fibrillation   History of Present Illness:     [name] is an 83 year old woman who I am seeing today for an evaluation of atrial fibrillation and possible left atrial appendage occlusion at the request of Dr. Abel Hoe.  The patient has a history of atrial fibrillation, endometrial cancer, seizure disorder, GERD, hemorrhagic stroke and provoked pulmonary embolism in 1975 after breast augmentation.  She was first diagnosed with atrial fibrillation in August 2024.  She has a history of hemorrhagic stroke in 1996.  In August 2024 she was started on Eliquis.   She is with her husband today in clinic.  Today she presents for LAAO. Procedure reviewed.  Objective Their past medical, social and family history was reviewed.     ROS:   Please see the history of present illness.    All other systems reviewed and are negative.   EKGs/Labs/Other Studies Reviewed:     The following studies were reviewed today:   March 23, 2023 EKG shows atrial fibrillation, ventricular rate 65 bpm, left anterior fascicular block          Physical Exam:     VS:  BP (!) 142/80 (BP Location: Left Arm, Patient Position: Sitting, Cuff Size: Normal)   Pulse 72   Ht 5\' 3"  (1.6 m)   Wt 169 lb (76.7 kg)   SpO2 96%   BMI 29.94 kg/m         Wt Readings from Last 3 Encounters:  05/19/23 169 lb (76.7 kg)  03/23/23 171 lb 9.6 oz (77.8 kg)  06/22/22 167 lb 12.8 oz (76.1 kg)      GEN: no distress CARD: Irregularly irregular, No MRG RESP: No IWOB. CTAB.         Assessment ASSESSMENT AND PLAN:     1. Atrial fibrillation, unspecified type (HCC)       #Persistent atrial fibrillation Rate controlled Unclear  chronicity. On atenolol for rate control/blood pressure management   Given the patient's history of intracranial hemorrhage and seizure disorder, she would like a stroke risk mitigation strategy that avoids long-term exposure anticoagulation which I think is very reasonable.  I discussed left atrial appendage occlusion and the Watchman procedure in detail during today's consultation.  I discussed the procedural details, its risks and recovery and likelihood of success.  She would like to proceed.   -----------   I have seen [name] in the office today who is being considered for a Watchman left atrial appendage closure device. I believe they will benefit from this procedure given their history of atrial fibrillation, CHA2DS2-VASc score of 5 and unadjusted ischemic stroke rate of 7.2% per year. Unfortunately, the patient is not felt to be a long term anticoagulation candidate secondary to history of intracranial hemorrhage and seizures. The patient's chart has been reviewed and I feel that they would be a candidate for short term oral anticoagulation after Watchman implant.    It is my belief that after undergoing a LAA closure procedure, [name] will not need long term anticoagulation which eliminates anticoagulation side effects and major bleeding risk.    Procedural risks for the Watchman implant have been reviewed with the patient including  a 0.5% risk of stroke, <1% risk of perforation and <1% risk of device embolization. Other risks include bleeding, vascular damage, tamponade, worsening renal function, and death. The patient understands these risk and wishes to proceed.       The published clinical data on the safety and effectiveness of WATCHMAN include but are not limited to the following: - Holmes DR, Evalene Hilda, Sick P et al. for the PROTECT AF Investigators. Percutaneous closure of the left atrial appendage versus warfarin therapy for prevention of stroke in patients with  atrial fibrillation: a randomised non-inferiority trial. Lancet 2009; 374: 534-42. Evalene Hilda, Doshi SK, Deloria Fetch D et al. on behalf of the PROTECT AF Investigators. Percutaneous Left Atrial Appendage Closure for Stroke Prophylaxis in Patients With Atrial Fibrillation 2.3-Year Follow-up of the PROTECT AF (Watchman Left Atrial Appendage System for Embolic Protection in Patients With Atrial Fibrillation) Trial. Circulation 2013; 127:720-729. - Alli O, Doshi S,  Kar S, Reddy VY, Sievert H et al. Quality of Life Assessment in the Randomized PROTECT AF (Percutaneous Closure of the Left Atrial Appendage Versus Warfarin Therapy for Prevention of Stroke in Patients With Atrial Fibrillation) Trial of Patients at Risk for Stroke With Nonvalvular Atrial Fibrillation. J Am Coll Cardiol 2013; 61:1790-8. Bartholome Ligas DR, Mario Sicard, Price M, Whisenant B, Sievert H, Doshi S, Huber K, Reddy V. Prospective randomized evaluation of the Watchman left atrial appendage Device in patients with atrial fibrillation versus long-term warfarin therapy; the PREVAIL trial. Journal of the [employer] of Cardiology, Vol. 4, No. 1, 2014, 1-11. - Kar S, Doshi SK, Sadhu A, Horton R, Osorio J et al. Primary outcome evaluation of a next-generation left atrial appendage closure device: results from the PINNACLE FLX trial. Circulation 2021;143(18)1754-1762.      After today's visit with the patient which was dedicated solely for shared decision making visit regarding LAA closure device, the patient decided to proceed with the LAA appendage closure procedure scheduled to be done in the near future at [facility]. Prior to the procedure, I would like to obtain a gated CT scan of the chest with contrast timed for PV/LA visualization.    Additionally, the patient will need an echocardiogram.   HAS-BLED score 3 Hypertension No  Abnormal renal and liver function (Dialysis, transplant, Cr >2.26 mg/dL /Cirrhosis or Bilirubin >2x  Normal or AST/ALT/AP >3x Normal) No  Stroke Yes  Bleeding Yes  Labile INR (Unstable/high INR) No  Elderly (>65) Yes  Drugs or alcohol (>= 8 drinks/week, anti-plt or NSAID) No    CHA2DS2-VASc Score = 5  The patient's score is based upon: CHF History: 0 HTN History: 0 Diabetes History: 0 Stroke History: 2 Vascular Disease History: 0 Age Score: 2 Gender Score: 1    Presents for LAAO today. Procedure reviewed.     Signed, Leanora Prophet. Marven Slimmer, MD, [facility], [facility] 09/21/2023 Electrophysiology [facility]

## 2023-09-21 NOTE — Discharge Summary (Incomplete)
 Electrophysiology Discharge Summary   Patient ID: [name],  MRN: [id number], DOB/AGE: July 05, 1940 83 y.o.  Admit date: 09/21/2023 Discharge date: 09/22/23  Primary Care Physician: Merl Star, MD  Primary Cardiologist: Antoinette Batman, MD  Electrophysiologist: CAMERON T LAMBERT, MD  Primary Discharge Diagnosis:  Persistent Atrial Fibrillation Poor candidacy for long term anticoagulation due to h/o intracranial hemorrhage CHA2DS2Vasc is 5  Secondary Discharge Diagnosis:  Hx ICH  Seizure Disorder   Procedures This Admission:  Transeptal Puncture Intra-procedural TEE which showed no LAA thrombus Left atrial appendage occlusive device placement on 09/21/23 by Dr. Marven Slimmer.  This study demonstrated: 1.Successful implantation of a WATCHMAN left atrial appendage occlusive device    2. TEE demonstrating no LAA thrombus 3. ICE demonstrating no LAA thrombus 4. No early apparent complications.  Post Implant Anticoagulation Strategy: Continue Eliquis 5mg  by mouth twice daily for 45 days after implant. After 45 days, stop Eliquis and start Plavix 75mg  by mouth once daily to complete 6 months of post implant medical therapy. Plan for CT scan 60 days after implant to assess appendage patency and Watchman position.   Brief HPI: [name] is a 83 y.o. female with a history of Persistent Atrial Fibrillation who was referred to Electrophysiology in the outpatient setting    Hospital Course:  The patient was admitted and underwent left atrial appendage occlusive device placement as above.  The patient was monitored overnight and has done very well with no concerns. Groin site has been stable without evidence of hematoma or bleeding. Wound care and restrictions were reviewed with the patient.   The patient has been scheduled for post procedure follow up with EP APP in approximately 6 weeks. They will restart Eliquis this evening and continue for 45 days then stop. At that time  she will transition to Plavix 75mg  daily to complete 6 months of therapy. They will require dental SBE for 6 month post op and should refrain from dental work or cleanings for the first 45 days post implant. SBE to be RXd at follow up.   A repeat CT scan will be performed in approximately 60 days to ensure proper seal of the device.   D/w RN for patientto get her morning Eliquis dose prior to discharge   Physical Exam: Vitals:   09/21/23 1815 09/21/23 1830 09/22/23 0449 09/22/23 0841  BP: (!) 148/63 131/68 135/62 139/75  Pulse: 73 75 83 90  Resp:   17 17  Temp:   98.1 F (36.7 C) 98.4 F (36.9 C)  TempSrc:   Oral Oral  SpO2: 100% 100% 100% 97%  Weight:      Height:        GEN: Well nourished, well developed in no acute distress NECK: No JVD; No carotid bruits CARDIAC: Irregularly irregular rate and rhythm, no murmurs, rubs, gallops RESPIRATORY:  Clear to auscultation without rales, wheezing or rhonchi  ABDOMEN: Soft, non-tender, non-distended EXTREMITIES:  No edema; No deformity. Groin sites are Stable     Discharge Medications:  Allergies as of 09/22/2023       Reactions   Citalopram    Sleepy, dull feeling   Augmentin [amoxicillin-pot Clavulanate] Diarrhea   severe   Bacitracin  Other (See Comments)   Doxycycline Diarrhea   Doxycycline Monohydrate Diarrhea   Moxifloxacin    Redness, non-healing (eye)   Moxifloxacin Hcl    Other reaction(s): redness (eyes)   Latex Rash   Bandaids/tape Other reaction(s): redness   Septra [sulfamethoxazole-trimethoprim] Rash   Tape  Rash   Zithromax [azithromycin] Rash        Medication List     TAKE these medications    atenolol 25 MG tablet Commonly known as: TENORMIN Take 25 mg by mouth daily.   clobetasol cream 0.05 % Commonly known as: TEMOVATE Apply 1 application topically 2 (two) times a week.   docusate sodium 100 MG capsule Commonly known as: COLACE Take 100 mg by mouth daily as needed for mild constipation.    doxycycline 20 MG tablet Commonly known as: PERIOSTAT Take 20 mg by mouth 2 (two) times daily.   Eliquis 5 MG Tabs tablet Generic drug: apixaban Take 5 mg by mouth 2 (two) times daily.   famotidine 40 MG tablet Commonly known as: PEPCID Take 40 mg by mouth 2 (two) times daily.   furosemide 20 MG tablet Commonly known as: LASIX Take 20 mg by mouth daily as needed for edema or fluid.   LORazepam 1 MG tablet Commonly known as: ATIVAN Take 1 mg by mouth at bedtime.   metroNIDAZOLE 0.75 % gel Commonly known as: METROGEL Apply 1 Application topically 2 (two) times daily.   omeprazole 20 MG capsule Commonly known as: PRILOSEC Take 20 mg by mouth daily.   Premarin vaginal cream Generic drug: conjugated estrogens Place 1 applicator vaginally once a week.   topiramate  100 MG tablet Commonly known as: TOPAMAX  TAKE 1 TABLET BY MOUTH IN THE MORNING AND 2 IN THE EVENING        Disposition:  Home with usual follow up as in AVS  Duration of Discharge Encounter:  APP Time: 10  Signed, Mertha Abrahams, PA-C

## 2023-09-21 NOTE — Discharge Instructions (Signed)
 [facility] Procedure, Care After  Procedure MD: Dr. Abram Hoguet Clinical Coordinator: Larkin Plumb, RN  This sheet gives you information about how to care for yourself after your procedure. Your health care provider may also give you more specific instructions. If you have problems or questions, contact your health care provider.  What can I expect after the procedure? After the procedure, it is common to have: Bruising around your puncture site. Tenderness around your puncture site. Tiredness (fatigue).  Medication instructions It is very important to continue to take your blood thinner as directed by your doctor after the Watchman procedure. Call your procedure doctor's office with question or concerns. If you are on Coumadin (warfarin), you will have your INR checked the week after your procedure, with a goal INR of 2.0 - 3.0. Please follow your medication instructions on your discharge summary. Only take the medications listed on your discharge paperwork.  Follow up You will be seen in 6 weeks after your procedure You will have a repeat CT scan or Echocardiogram approximately 8 weeks after your procedure mark to check your device You will follow up the MD/APP who performed your procedure 6 months after your procedure The Watchman Clinical Coordinator will check in with you from time to time, including 1 and 2 years after your procedure.  NO DENTAL CLEANINGS FOR 45 days. After that, you will require antibiotics for dental procedures the first 6 months.   Follow these instructions at home: Puncture site care  Follow instructions from your health care provider about how to take care of your puncture site. Make sure you: If present, leave stitches (sutures), skin glue, or adhesive strips in place.  If a large square bandage is present, this may be removed 24 hours after surgery.  Check your puncture site every day for signs of infection. Check for: Redness, swelling, or pain. Fluid  or blood. If your puncture site starts to bleed, lie down on your back, apply firm pressure to the area, and contact your health care provider. Warmth. Pus or a bad smell. Driving Do not drive yourself home if you received sedation Do not drive for at least 4 days after your procedure or however long your health care provider recommends. (Do not resume driving if you have previously been instructed not to drive for other health reasons.) Do not spend greater than 1 hour at a time in a car for the first 3 days. Stop and take a break with a 5 minute walk at least every hour.  Do not drive or use heavy machinery while taking prescription pain medicine.  Activity Avoid activities that take a lot of effort, including exercise, for at least 7 days after your procedure. For the first 3 days, avoid sitting for longer than one hour at a time.  Avoid alcoholic beverages, signing paperwork, or participating in legal proceedings for 24 hours after receiving sedation Do not lift anything that is heavier than 10 lb (4.5 kg) for one week.  No sexual activity for 1 week.  Return to your normal activities as told by your health care provider. Ask your health care provider what activities are safe for you. General instructions Take over-the-counter and prescription medicines only as told by your health care provider. Do not use any products that contain nicotine or tobacco, such as cigarettes and e-cigarettes. If you need help quitting, ask your health care provider. You may shower after 24 hours, but Do not take baths, swim, or use a hot tub for  1 week.  Do not drink alcohol for 24 hours after your procedure. Keep all follow-up visits as told by your health care provider. This is important. Dental Work: You will require antibiotics prior to any dental work, including cleanings, for 6 months after your Watchman implantation to help protect you from infection. After 6 months, antibiotics are no longer  required. Contact a health care provider if: You have redness, mild swelling, or pain around your puncture site. You have soreness in your throat or at your puncture site that does not improve after several days You have fluid or blood coming from your puncture site that stops after applying firm pressure to the area. Your puncture site feels warm to the touch. You have pus or a bad smell coming from your puncture site. You have a fever. You have chest pain or discomfort that spreads to your neck, jaw, or arm. You are sweating a lot. You feel nauseous. You have a fast or irregular heartbeat. You have shortness of breath. You are dizzy or light-headed and feel the need to lie down. You have pain or numbness in the arm or leg closest to your puncture site. Get help right away if: Your puncture site suddenly swells. Your puncture site is bleeding and the bleeding does not stop after applying firm pressure to the area. These symptoms may represent a serious problem that is an emergency. Do not wait to see if the symptoms will go away. Get medical help right away. Call your local emergency services (911 in the [place] Do not drive yourself to the hospital. Summary After the procedure, it is normal to have bruising and tenderness at the puncture site in your groin, neck, or forearm. Check your puncture site every day for signs of infection. Get help right away if your puncture site is bleeding and the bleeding does not stop after applying firm pressure to the area. This is a medical emergency.  This information is not intended to replace advice given to you by your health care provider. Make sure you discuss any questions you have with your health care provider.   Information on my medicine - ELIQUIS (apixaban)  This medication education was reviewed with me or my healthcare representative as part of my discharge preparation.    Why was Eliquis prescribed for you? Eliquis was prescribed  for you to reduce the risk of a blood clot forming that can cause a stroke if you have a medical condition called atrial fibrillation (a type of irregular heartbeat).  What do You need to know about Eliquis ? Take your Eliquis TWICE DAILY - one tablet in the morning and one tablet in the evening with or without food. If you have difficulty swallowing the tablet whole please discuss with your pharmacist how to take the medication safely.  Take Eliquis exactly as prescribed by your doctor and DO NOT stop taking Eliquis without talking to the doctor who prescribed the medication.  Stopping may increase your risk of developing a stroke.  Refill your prescription before you run out.  After discharge, you should have regular check-up appointments with your healthcare provider that is prescribing your Eliquis.  In the future your dose may need to be changed if your kidney function or weight changes by a significant amount or as you get older.  What do you do if you miss a dose? If you miss a dose, take it as soon as you remember on the same day and resume taking twice daily.  Do not take more than one dose of ELIQUIS at the same time to make up a missed dose.  Important Safety Information A possible side effect of Eliquis is bleeding. You should call your healthcare provider right away if you experience any of the following: Bleeding from an injury or your nose that does not stop. Unusual colored urine (red or dark brown) or unusual colored stools (red or black). Unusual bruising for unknown reasons. A serious fall or if you hit your head (even if there is no bleeding).  Some medicines may interact with Eliquis and might increase your risk of bleeding or clotting while on Eliquis. To help avoid this, consult your healthcare provider or pharmacist prior to using any new prescription or non-prescription medications, including herbals, vitamins, non-steroidal anti-inflammatory drugs (NSAIDs) and  supplements.  This website has more information on Eliquis (apixaban): http://www.eliquis.com/eliquis/home

## 2023-09-21 NOTE — Anesthesia Postprocedure Evaluation (Signed)
 Anesthesia Post Note  Patient: [name]  Procedure(s) Performed: LEFT ATRIAL APPENDAGE OCCLUSION TRANSESOPHAGEAL ECHOCARDIOGRAM     Patient location during evaluation: PACU Anesthesia Type: General Level of consciousness: awake and alert Pain management: pain level controlled Vital Signs Assessment: post-procedure vital signs reviewed and stable Respiratory status: spontaneous breathing, nonlabored ventilation, respiratory function stable and patient connected to nasal cannula oxygen Cardiovascular status: blood pressure returned to baseline and stable Postop Assessment: no apparent nausea or vomiting Anesthetic complications: no  There were no known notable events for this encounter.  Last Vitals:  Vitals:   09/21/23 1738 09/21/23 1740  BP: (!) 145/92 128/89  Pulse: 75 76  Resp: 16   Temp: (!) 36.4 C   SpO2: 96% 100%    Last Pain:  Vitals:   09/21/23 1738  TempSrc: Oral  PainSc:                  Nashira Mcglynn D Chanler Mendonca

## 2023-09-21 NOTE — Anesthesia Procedure Notes (Signed)
 Procedure Name: Intubation Date/Time: 09/21/2023 2:12 PM  Performed by: Katrinka Parr, CRNAPre-anesthesia Checklist: Patient identified, Emergency Drugs available, Suction available and Patient being monitored Patient Re-evaluated:Patient Re-evaluated prior to induction Oxygen Delivery Method: Circle System Utilized Preoxygenation: Pre-oxygenation with 100% oxygen Induction Type: IV induction Ventilation: Mask ventilation without difficulty Laryngoscope Size: Mac and 3 Grade View: Grade I Tube type: Oral Tube size: 7.5 mm Number of attempts: 1 Airway Equipment and Method: Stylet Placement Confirmation: ETT inserted through vocal cords under direct vision, positive ETCO2 and breath sounds checked- equal and bilateral Secured at: 22 cm Tube secured with: Tape Dental Injury: Teeth and Oropharynx as per pre-operative assessment

## 2023-09-22 MED FILL — Fentanyl Citrate Preservative Free (PF) Inj 100 MCG/2ML: INTRAMUSCULAR | Qty: 2 | Status: AC

## 2023-09-22 NOTE — Congregational Nurse Program (Signed)
 Patient given discharge instructions, medication list and follow up appointments, patient and spouse verbalized understanding at this time. IV and telemetry were removed. Right hand IV with some bleeding post removal pressure held and dressing applied. Clean dry and intact at this time, hand with some bruising post removal. Will discharge home as ordered. Bedside RN updated. Gustabo Gordillo Jessup RN

## 2023-09-25 ENCOUNTER — Telehealth: Payer: Self-pay

## 2023-09-25 DIAGNOSIS — Z95818 Presence of other cardiac implants and grafts: Secondary | ICD-10-CM

## 2023-09-25 DIAGNOSIS — I4891 Unspecified atrial fibrillation: Secondary | ICD-10-CM

## 2023-09-25 NOTE — Telephone Encounter (Signed)
  [facility]   [facility] Team  Contacted the patient regarding discharge from [facility] on 09/22/2023  The patient understands to follow up with [name] on 11/03/2023  The patient understands discharge instructions? Yes  The patient understands medications and regimen? Yes   The patient reports groin site looks healthy with no S/S of bleeding or infection  The patient understands to call with any questions or concerns prior to scheduled visit.    The patient reports she has had a sore throat and constipation since her procedure. She has been using cough drops and the pain with swallowing continues to improve, although it is still uncomfortable. She has been taking Miralax and did have a bowel movement today. She has an appointment with Dr. Joice [name] coming up and will discuss constipation with him if it is still an issue. She was grateful for call and agreed with plan.

## 2023-10-03 DIAGNOSIS — G40209 Localization-related (focal) (partial) symptomatic epilepsy and epileptic syndromes with complex partial seizures, not intractable, without status epilepticus: Secondary | ICD-10-CM | POA: Diagnosis not present

## 2023-10-03 DIAGNOSIS — I4891 Unspecified atrial fibrillation: Secondary | ICD-10-CM | POA: Diagnosis not present

## 2023-10-03 DIAGNOSIS — L719 Rosacea, unspecified: Secondary | ICD-10-CM | POA: Diagnosis not present

## 2023-10-03 DIAGNOSIS — C55 Malignant neoplasm of uterus, part unspecified: Secondary | ICD-10-CM | POA: Diagnosis not present

## 2023-10-03 DIAGNOSIS — F419 Anxiety disorder, unspecified: Secondary | ICD-10-CM | POA: Diagnosis not present

## 2023-10-03 DIAGNOSIS — E039 Hypothyroidism, unspecified: Secondary | ICD-10-CM | POA: Diagnosis not present

## 2023-10-03 DIAGNOSIS — M8580 Other specified disorders of bone density and structure, unspecified site: Secondary | ICD-10-CM | POA: Diagnosis not present

## 2023-10-18 DIAGNOSIS — R300 Dysuria: Secondary | ICD-10-CM | POA: Diagnosis not present

## 2023-10-18 DIAGNOSIS — R3989 Other symptoms and signs involving the genitourinary system: Secondary | ICD-10-CM | POA: Diagnosis not present

## 2023-10-26 DIAGNOSIS — T50905A Adverse effect of unspecified drugs, medicaments and biological substances, initial encounter: Secondary | ICD-10-CM | POA: Diagnosis not present

## 2023-10-27 ENCOUNTER — Inpatient Hospital Stay
Admission: EM | Admit: 2023-10-27 | Discharge: 2023-10-28 | DRG: 916 | Disposition: A | Discharge status: Home Health | Attending: Osteopathic Medicine | Admitting: Osteopathic Medicine

## 2023-10-27 ENCOUNTER — Other Ambulatory Visit: Payer: Self-pay

## 2023-10-27 ENCOUNTER — Emergency Department

## 2023-10-27 DIAGNOSIS — Z882 Allergy status to sulfonamides status: Secondary | ICD-10-CM

## 2023-10-27 DIAGNOSIS — R5381 Other malaise: Secondary | ICD-10-CM | POA: Diagnosis present

## 2023-10-27 DIAGNOSIS — T7840XA Allergy, unspecified, initial encounter: Secondary | ICD-10-CM | POA: Diagnosis not present

## 2023-10-27 DIAGNOSIS — I480 Paroxysmal atrial fibrillation: Secondary | ICD-10-CM | POA: Diagnosis present

## 2023-10-27 DIAGNOSIS — Z888 Allergy status to other drugs, medicaments and biological substances status: Secondary | ICD-10-CM

## 2023-10-27 DIAGNOSIS — D72829 Elevated white blood cell count, unspecified: Secondary | ICD-10-CM

## 2023-10-27 DIAGNOSIS — N179 Acute kidney failure, unspecified: Secondary | ICD-10-CM | POA: Diagnosis not present

## 2023-10-27 DIAGNOSIS — Z85820 Personal history of malignant melanoma of skin: Secondary | ICD-10-CM | POA: Diagnosis not present

## 2023-10-27 DIAGNOSIS — Z880 Allergy status to penicillin: Secondary | ICD-10-CM | POA: Diagnosis not present

## 2023-10-27 DIAGNOSIS — I4891 Unspecified atrial fibrillation: Secondary | ICD-10-CM | POA: Diagnosis not present

## 2023-10-27 DIAGNOSIS — Z87891 Personal history of nicotine dependence: Secondary | ICD-10-CM | POA: Diagnosis not present

## 2023-10-27 DIAGNOSIS — T886XXA Anaphylactic reaction due to adverse effect of correct drug or medicament properly administered, initial encounter: Secondary | ICD-10-CM | POA: Diagnosis not present

## 2023-10-27 DIAGNOSIS — E872 Acidosis, unspecified: Secondary | ICD-10-CM | POA: Diagnosis present

## 2023-10-27 DIAGNOSIS — E871 Hypo-osmolality and hyponatremia: Secondary | ICD-10-CM | POA: Diagnosis not present

## 2023-10-27 DIAGNOSIS — Z833 Family history of diabetes mellitus: Secondary | ICD-10-CM

## 2023-10-27 DIAGNOSIS — Z87440 Personal history of urinary (tract) infections: Secondary | ICD-10-CM

## 2023-10-27 DIAGNOSIS — Z95818 Presence of other cardiac implants and grafts: Secondary | ICD-10-CM

## 2023-10-27 DIAGNOSIS — E8729 Other acidosis: Secondary | ICD-10-CM

## 2023-10-27 DIAGNOSIS — K219 Gastro-esophageal reflux disease without esophagitis: Secondary | ICD-10-CM | POA: Diagnosis not present

## 2023-10-27 DIAGNOSIS — Z8249 Family history of ischemic heart disease and other diseases of the circulatory system: Secondary | ICD-10-CM

## 2023-10-27 DIAGNOSIS — R000 Tachycardia, unspecified: Secondary | ICD-10-CM | POA: Diagnosis not present

## 2023-10-27 DIAGNOSIS — R918 Other nonspecific abnormal finding of lung field: Secondary | ICD-10-CM | POA: Diagnosis not present

## 2023-10-27 DIAGNOSIS — Z79899 Other long term (current) drug therapy: Secondary | ICD-10-CM

## 2023-10-27 DIAGNOSIS — Z881 Allergy status to other antibiotic agents status: Secondary | ICD-10-CM | POA: Diagnosis not present

## 2023-10-27 DIAGNOSIS — R21 Rash and other nonspecific skin eruption: Secondary | ICD-10-CM

## 2023-10-27 DIAGNOSIS — Z7901 Long term (current) use of anticoagulants: Secondary | ICD-10-CM

## 2023-10-27 DIAGNOSIS — E039 Hypothyroidism, unspecified: Secondary | ICD-10-CM | POA: Diagnosis not present

## 2023-10-27 DIAGNOSIS — L270 Generalized skin eruption due to drugs and medicaments taken internally: Secondary | ICD-10-CM | POA: Diagnosis not present

## 2023-10-27 DIAGNOSIS — G40109 Localization-related (focal) (partial) symptomatic epilepsy and epileptic syndromes with simple partial seizures, not intractable, without status epilepticus: Secondary | ICD-10-CM | POA: Diagnosis not present

## 2023-10-27 DIAGNOSIS — Z807 Family history of other malignant neoplasms of lymphoid, hematopoietic and related tissues: Secondary | ICD-10-CM

## 2023-10-27 DIAGNOSIS — Z79890 Hormone replacement therapy (postmenopausal): Secondary | ICD-10-CM

## 2023-10-27 DIAGNOSIS — I10 Essential (primary) hypertension: Secondary | ICD-10-CM | POA: Diagnosis not present

## 2023-10-27 DIAGNOSIS — T782XXA Anaphylactic shock, unspecified, initial encounter: Principal | ICD-10-CM | POA: Diagnosis present

## 2023-10-27 DIAGNOSIS — Z91040 Latex allergy status: Secondary | ICD-10-CM

## 2023-10-27 DIAGNOSIS — Z8616 Personal history of COVID-19: Secondary | ICD-10-CM

## 2023-10-27 DIAGNOSIS — Z86711 Personal history of pulmonary embolism: Secondary | ICD-10-CM

## 2023-10-27 DIAGNOSIS — Z6831 Body mass index (BMI) 31.0-31.9, adult: Secondary | ICD-10-CM

## 2023-10-27 DIAGNOSIS — I700 Atherosclerosis of aorta: Secondary | ICD-10-CM | POA: Diagnosis not present

## 2023-10-27 DIAGNOSIS — E66811 Obesity, class 1: Secondary | ICD-10-CM | POA: Diagnosis not present

## 2023-10-27 DIAGNOSIS — I959 Hypotension, unspecified: Secondary | ICD-10-CM

## 2023-10-27 DIAGNOSIS — I517 Cardiomegaly: Secondary | ICD-10-CM | POA: Diagnosis not present

## 2023-10-27 DIAGNOSIS — M8580 Other specified disorders of bone density and structure, unspecified site: Secondary | ICD-10-CM | POA: Diagnosis not present

## 2023-10-27 DIAGNOSIS — R262 Difficulty in walking, not elsewhere classified: Secondary | ICD-10-CM | POA: Diagnosis not present

## 2023-10-27 DIAGNOSIS — Z86008 Personal history of in-situ neoplasm of other site: Secondary | ICD-10-CM

## 2023-10-27 DIAGNOSIS — T378X5A Adverse effect of other specified systemic anti-infectives and antiparasitics, initial encounter: Secondary | ICD-10-CM | POA: Diagnosis present

## 2023-10-27 LAB — URINALYSIS, W/ REFLEX TO CULTURE (INFECTION SUSPECTED)
Bacteria, UA: NONE SEEN
Bilirubin Urine: NEGATIVE
Glucose, UA: 150 mg/dL — AB
Ketones, ur: 20 mg/dL — AB
Nitrite: NEGATIVE
Protein, ur: NEGATIVE mg/dL
Specific Gravity, Urine: 1.008 (ref 1.005–1.030)
pH: 6 (ref 5.0–8.0)

## 2023-10-27 LAB — BASIC METABOLIC PANEL WITH GFR
Anion gap: 13 (ref 5–15)
Anion gap: 8 (ref 5–15)
BUN: 17 mg/dL (ref 8–23)
BUN: 17 mg/dL (ref 8–23)
CO2: 17 mmol/L — ABNORMAL LOW (ref 22–32)
CO2: 16 mmol/L — ABNORMAL LOW (ref 22–32)
Calcium: 7.8 mg/dL — ABNORMAL LOW (ref 8.9–10.3)
Calcium: 8.1 mg/dL — ABNORMAL LOW (ref 8.9–10.3)
Chloride: 99 mmol/L (ref 98–111)
Chloride: 108 mmol/L (ref 98–111)
Creatinine, Ser: 1.06 mg/dL — ABNORMAL HIGH (ref 0.44–1.00)
Creatinine, Ser: 0.59 mg/dL (ref 0.44–1.00)
GFR, Estimated: 53 mL/min — ABNORMAL LOW (ref >60)
GFR, Estimated: >60 mL/min (ref >60)
Glucose, Bld: 187 mg/dL — ABNORMAL HIGH (ref 70–99)
Glucose, Bld: 132 mg/dL — ABNORMAL HIGH (ref 70–99)
Potassium: 2.7 mmol/L — CL (ref 3.5–5.1)
Potassium: 3.5 mmol/L (ref 3.5–5.1)
Sodium: 129 mmol/L — ABNORMAL LOW (ref 135–145)
Sodium: 132 mmol/L — ABNORMAL LOW (ref 135–145)

## 2023-10-27 LAB — CBC
HCT: 40.8 % (ref 36.0–46.0)
Hemoglobin: 13.8 g/dL (ref 12.0–15.0)
MCH: 32.2 pg (ref 26.0–34.0)
MCHC: 33.8 g/dL (ref 30.0–36.0)
MCV: 95.1 fL (ref 80.0–100.0)
Platelets: 192 K/uL (ref 150–400)
RBC: 4.29 MIL/uL (ref 3.87–5.11)
RDW: 13.2 % (ref 11.5–15.5)
WBC: 19.6 K/uL — ABNORMAL HIGH (ref 4.0–10.5)
nRBC: 0 % (ref 0.0–0.2)

## 2023-10-27 LAB — RESPIRATORY PANEL BY PCR

## 2023-10-27 LAB — COMPREHENSIVE METABOLIC PANEL WITH GFR
ALT: 45 U/L — ABNORMAL HIGH (ref 0–44)
AST: 43 U/L — ABNORMAL HIGH (ref 15–41)
Albumin: 2.9 g/dL — ABNORMAL LOW (ref 3.5–5.0)
Alkaline Phosphatase: 125 U/L (ref 38–126)
Anion gap: 10 (ref 5–15)
BUN: 18 mg/dL (ref 8–23)
CO2: 18 mmol/L — ABNORMAL LOW (ref 22–32)
Calcium: 7.9 mg/dL — ABNORMAL LOW (ref 8.9–10.3)
Chloride: 98 mmol/L (ref 98–111)
Creatinine, Ser: 1.14 mg/dL — ABNORMAL HIGH (ref 0.44–1.00)
GFR, Estimated: 48 mL/min — ABNORMAL LOW (ref >60)
Glucose, Bld: 121 mg/dL — ABNORMAL HIGH (ref 70–99)
Potassium: 4 mmol/L (ref 3.5–5.1)
Sodium: 126 mmol/L — ABNORMAL LOW (ref 135–145)
Total Bilirubin: 1.7 mg/dL — ABNORMAL HIGH (ref 0.0–1.2)
Total Protein: 5.6 g/dL — ABNORMAL LOW (ref 6.5–8.1)

## 2023-10-27 LAB — ELECTROLYTE PANEL
Anion gap: 12 (ref 5–15)
CO2: 18 mmol/L — ABNORMAL LOW (ref 22–32)
Chloride: 100 mmol/L (ref 98–111)
Potassium: 2.8 mmol/L — ABNORMAL LOW (ref 3.5–5.1)
Sodium: 130 mmol/L — ABNORMAL LOW (ref 135–145)

## 2023-10-27 LAB — CBC WITH DIFFERENTIAL/PLATELET
Abs Immature Granulocytes: 0.17 K/uL — ABNORMAL HIGH (ref 0.00–0.07)
Basophils Absolute: 0.1 K/uL (ref 0.0–0.1)
Basophils Relative: 0 %
Eosinophils Absolute: 0.3 K/uL (ref 0.0–0.5)
Eosinophils Relative: 2 %
HCT: 44.9 % (ref 36.0–46.0)
Hemoglobin: 14.9 g/dL (ref 12.0–15.0)
Immature Granulocytes: 1 %
Lymphocytes Relative: 5 %
Lymphs Abs: 0.7 K/uL (ref 0.7–4.0)
MCH: 32.2 pg (ref 26.0–34.0)
MCHC: 33.2 g/dL (ref 30.0–36.0)
MCV: 97 fL (ref 80.0–100.0)
Monocytes Absolute: 0.3 K/uL (ref 0.1–1.0)
Monocytes Relative: 2 %
Neutro Abs: 12.9 K/uL — ABNORMAL HIGH (ref 1.7–7.7)
Neutrophils Relative %: 90 %
Platelets: 187 K/uL (ref 150–400)
RBC: 4.63 MIL/uL (ref 3.87–5.11)
RDW: 13.2 % (ref 11.5–15.5)
WBC: 14.5 K/uL — ABNORMAL HIGH (ref 4.0–10.5)
nRBC: 0 % (ref 0.0–0.2)

## 2023-10-27 LAB — LACTIC ACID, PLASMA
Lactic Acid, Venous: 2.2 mmol/L (ref 0.5–1.9)
Lactic Acid, Venous: 2.7 mmol/L (ref 0.5–1.9)
Lactic Acid, Venous: 3.2 mmol/L (ref 0.5–1.9)
Lactic Acid, Venous: 1.4 mmol/L (ref 0.5–1.9)

## 2023-10-27 LAB — PROTIME-INR
INR: 1.9 — ABNORMAL HIGH (ref 0.8–1.2)
Prothrombin Time: 23 s — ABNORMAL HIGH (ref 11.4–15.2)

## 2023-10-27 LAB — GLUCOSE, CAPILLARY
Glucose-Capillary: 108 mg/dL — ABNORMAL HIGH (ref 70–99)
Glucose-Capillary: 130 mg/dL — ABNORMAL HIGH (ref 70–99)
Glucose-Capillary: 158 mg/dL — ABNORMAL HIGH (ref 70–99)
Glucose-Capillary: 164 mg/dL — ABNORMAL HIGH (ref 70–99)
Glucose-Capillary: 213 mg/dL — ABNORMAL HIGH (ref 70–99)
Glucose-Capillary: 249 mg/dL — ABNORMAL HIGH (ref 70–99)

## 2023-10-27 LAB — C-REACTIVE PROTEIN: CRP: 14.1 mg/dL — ABNORMAL HIGH (ref <1.0)

## 2023-10-27 LAB — SEDIMENTATION RATE: Sed Rate: 4 mm/h (ref 0–30)

## 2023-10-27 LAB — MRSA NEXT GEN BY PCR, NASAL: MRSA by PCR Next Gen: NOT DETECTED

## 2023-10-27 LAB — PHOSPHORUS: Phosphorus: 1.6 mg/dL — ABNORMAL LOW (ref 2.5–4.6)

## 2023-10-27 LAB — MAGNESIUM: Magnesium: 1.7 mg/dL (ref 1.7–2.4)

## 2023-10-27 LAB — CBG MONITORING, ED: Glucose-Capillary: 164 mg/dL — ABNORMAL HIGH (ref 70–99)

## 2023-10-27 MED ORDER — EPINEPHRINE 0.3 MG/0.3ML IJ SOAJ
INTRAMUSCULAR | Status: AC
  Administered 2023-10-27: 0.3 mg via INTRAMUSCULAR
  Filled 2023-10-27: qty 0.3

## 2023-10-27 MED ORDER — TOPIRAMATE 100 MG PO TABS
200.0000 mg | ORAL_TABLET | Freq: Every day | ORAL | Status: AC
  Administered 2023-10-27: 200 mg via ORAL
  Filled 2023-10-27: qty 2

## 2023-10-27 MED ORDER — CHLORHEXIDINE GLUCONATE CLOTH 2 % EX PADS
6.0000 | MEDICATED_PAD | Freq: Every day | CUTANEOUS | Status: DC
  Administered 2023-10-27 – 2023-10-28 (×2): 6 via TOPICAL
  Filled 2023-10-27: qty 6

## 2023-10-27 MED ORDER — TOPIRAMATE 100 MG PO TABS: 200.0000 mg | ORAL_TABLET | Freq: Every day | ORAL | Status: DC

## 2023-10-27 MED ORDER — CALCIUM GLUCONATE-NACL 2-0.675 GM/100ML-% IV SOLN
2.0000 g | Freq: Once | INTRAVENOUS | Status: DC
  Filled 2023-10-27 (×2): qty 100

## 2023-10-27 MED ORDER — INSULIN ASPART 100 UNIT/ML IJ SOLN
0.0000 [IU] | INTRAMUSCULAR | Status: DC
  Administered 2023-10-27 (×2): 3 [IU] via SUBCUTANEOUS
  Administered 2023-10-27: 5 [IU] via SUBCUTANEOUS
  Administered 2023-10-27: 3 [IU] via SUBCUTANEOUS
  Administered 2023-10-27: 2 [IU] via SUBCUTANEOUS
  Filled 2023-10-27 (×5): qty 1

## 2023-10-27 MED ORDER — METHYLPREDNISOLONE SODIUM SUCC 125 MG IJ SOLR
INTRAMUSCULAR | Status: AC
  Administered 2023-10-27: 125 mg via INTRAVENOUS
  Filled 2023-10-27: qty 2

## 2023-10-27 MED ORDER — METHYLPREDNISOLONE SODIUM SUCC 125 MG IJ SOLR
60.0000 mg | Freq: Two times a day (BID) | INTRAMUSCULAR | Status: DC
  Administered 2023-10-27: 60 mg via INTRAVENOUS
  Filled 2023-10-27: qty 2

## 2023-10-27 MED ORDER — ONDANSETRON HCL 4 MG/2ML IJ SOLN: 4.0000 mg | Freq: Four times a day (QID) | INTRAMUSCULAR | Status: DC | PRN

## 2023-10-27 MED ORDER — MAGNESIUM SULFATE 2 GM/50ML IV SOLN
2.0000 g | Freq: Once | INTRAVENOUS | Status: AC
  Administered 2023-10-27: 2 g via INTRAVENOUS
  Filled 2023-10-27: qty 50

## 2023-10-27 MED ORDER — POLYETHYLENE GLYCOL 3350 17 G PO PACK: 17.0000 g | PACK | Freq: Every day | ORAL | Status: DC | PRN

## 2023-10-27 MED ORDER — METHYLPREDNISOLONE SODIUM SUCC 40 MG IJ SOLR
40.0000 mg | Freq: Every day | INTRAMUSCULAR | Status: DC
  Administered 2023-10-28: 40 mg via INTRAVENOUS
  Filled 2023-10-27: qty 1

## 2023-10-27 MED ORDER — METHYLPREDNISOLONE SODIUM SUCC 125 MG IJ SOLR: 125.0000 mg | INTRAMUSCULAR | Status: AC

## 2023-10-27 MED ORDER — FAMOTIDINE IN NACL 20-0.9 MG/50ML-% IV SOLN
INTRAVENOUS | Status: AC
  Administered 2023-10-27: 20 mg via INTRAVENOUS
  Filled 2023-10-27: qty 50

## 2023-10-27 MED ORDER — ADULT MULTIVITAMIN W/MINERALS CH: 1.0000 | ORAL_TABLET | Freq: Every day | ORAL | Status: DC

## 2023-10-27 MED ORDER — DOCUSATE SODIUM 100 MG PO CAPS: 100.0000 mg | ORAL_CAPSULE | Freq: Two times a day (BID) | ORAL | Status: DC | PRN

## 2023-10-27 MED ORDER — EPINEPHRINE 0.3 MG/0.3ML IJ SOAJ: 0.3000 mg | Freq: Once | INTRAMUSCULAR | Status: AC

## 2023-10-27 MED ORDER — GLUCAGON HCL RDNA (DIAGNOSTIC) 1 MG IJ SOLR
1.0000 mg/h | INTRAVENOUS | Status: DC
  Administered 2023-10-27: 1 mg/h via INTRAVENOUS
  Filled 2023-10-27: qty 5

## 2023-10-27 MED ORDER — POTASSIUM CHLORIDE CRYS ER 20 MEQ PO TBCR
40.0000 meq | EXTENDED_RELEASE_TABLET | ORAL | Status: DC
Start: 2023-10-27 — End: 2023-10-27
  Filled 2023-10-27: qty 2

## 2023-10-27 MED ORDER — SODIUM CHLORIDE 0.9 % IV BOLUS
2000.0000 mL | Freq: Once | INTRAVENOUS | Status: AC
  Administered 2023-10-27: 2000 mL via INTRAVENOUS

## 2023-10-27 MED ORDER — EPINEPHRINE HCL 5 MG/250ML IV SOLN IN NS
INTRAVENOUS | Status: AC
  Administered 2023-10-27: 0.5 ug/min via INTRAVENOUS
  Filled 2023-10-27: qty 250

## 2023-10-27 MED ORDER — TOPIRAMATE 100 MG PO TABS
100.0000 mg | ORAL_TABLET | Freq: Every day | ORAL | Status: DC
  Administered 2023-10-28: 100 mg via ORAL
  Filled 2023-10-27 (×2): qty 1

## 2023-10-27 MED ORDER — ENSURE PLUS HIGH PROTEIN PO LIQD
237.0000 mL | Freq: Two times a day (BID) | ORAL | Status: DC
  Administered 2023-10-27 – 2023-10-28 (×3): 237 mL via ORAL

## 2023-10-27 MED ORDER — SODIUM CHLORIDE 0.9 % IV BOLUS
500.0000 mL | Freq: Once | INTRAVENOUS | Status: AC
  Administered 2023-10-27: 500 mL via INTRAVENOUS

## 2023-10-27 MED ORDER — GLUCAGON HCL RDNA (DIAGNOSTIC) 1 MG IJ SOLR
1.0000 mg | Freq: Once | INTRAMUSCULAR | Status: AC
  Administered 2023-10-27: 1 mg via INTRAVENOUS
  Filled 2023-10-27: qty 1

## 2023-10-27 MED ORDER — FAMOTIDINE 20 MG PO TABS
20.0000 mg | ORAL_TABLET | Freq: Two times a day (BID) | ORAL | Status: DC
  Administered 2023-10-27 – 2023-10-28 (×3): 20 mg via ORAL
  Filled 2023-10-27 (×3): qty 1

## 2023-10-27 MED ORDER — APIXABAN 5 MG PO TABS
5.0000 mg | ORAL_TABLET | Freq: Two times a day (BID) | ORAL | Status: DC
  Administered 2023-10-27 – 2023-10-28 (×3): 5 mg via ORAL
  Filled 2023-10-27 (×3): qty 1

## 2023-10-27 MED ORDER — EPINEPHRINE HCL 5 MG/250ML IV SOLN IN NS: 0.5000 ug/min | INTRAVENOUS | Status: DC

## 2023-10-27 MED ORDER — ADULT MULTIVITAMIN W/MINERALS CH
1.0000 | ORAL_TABLET | Freq: Every day | ORAL | Status: DC
  Administered 2023-10-28: 1 via ORAL
  Filled 2023-10-27: qty 1

## 2023-10-27 MED ORDER — THIAMINE HCL 100 MG PO TABS
100.0000 mg | ORAL_TABLET | Freq: Every day | ORAL | Status: DC
  Administered 2023-10-28: 100 mg via ORAL
  Filled 2023-10-27 (×2): qty 1

## 2023-10-27 MED ORDER — FAMOTIDINE IN NACL 20-0.9 MG/50ML-% IV SOLN: 20.0000 mg | Freq: Once | INTRAVENOUS | Status: AC

## 2023-10-27 MED ORDER — BRIVARACETAM 25 MG PO TABS
50.0000 mg | ORAL_TABLET | Freq: Two times a day (BID) | ORAL | Status: DC
  Administered 2023-10-27: 50 mg via ORAL
  Filled 2023-10-27: qty 2

## 2023-10-27 MED ORDER — FAMOTIDINE IN NACL 20-0.9 MG/50ML-% IV SOLN
20.0000 mg | Freq: Two times a day (BID) | INTRAVENOUS | Status: DC
  Filled 2023-10-27: qty 50

## 2023-10-27 MED ORDER — SODIUM PHOSPHATES 45 MMOLE/15ML IV SOLN
30.0000 mmol | Freq: Once | INTRAVENOUS | Status: AC
  Administered 2023-10-27: 30 mmol via INTRAVENOUS
  Filled 2023-10-27: qty 10

## 2023-10-27 MED ORDER — POTASSIUM CHLORIDE 20 MEQ PO PACK
40.0000 meq | PACK | ORAL | Status: AC
  Administered 2023-10-27 (×2): 40 meq
  Filled 2023-10-27 (×2): qty 2

## 2023-10-27 NOTE — Plan of Care (Signed)

## 2023-10-27 NOTE — H&P (Signed)
 NAME:  [name], MRN:  [id number], DOB:  [birth year], LOS: 0 ADMISSION DATE:  10/27/2023, CONSULTATION DATE:  10/27/2023 REFERRING MD:  Dr. Cyrena, CHIEF COMPLAINT:  Anaphylactic Shock   History of Present Illness:  [name] is a 83 yo with a PMH significant for Afib s/p Watchman 09/2023, UTI, Hemorrhagic stroke, Focal seizures, GERD, and Hypothyroidism that presented with Anaphylactic shock to [facility] ER. To review, patient was treated with Macrobid for a UTI, last dose 7/9 evening, intermittent fevers. She had new weakness yesterday, along with a red face, which progressed throughout the day to a raised red patchy rash to her face, arms, torso, and legs. She presented to Urgent care, was given Benadryl and Allegra. Due to worsening weakness and lethargy EMS was called, BP was 80's/40's, tachy 130's, she was given Epi pen and 50 mg IV Benadryl. Husband states she has not taken any additional new medications aside from the Macrobid, no new foods, No gardening or outside work, no bug bites, no exposure to sick individuals.   ED Course: Upon arrival to ER BP 60's/30's, received additional Epi pen, Pepcid , Solumedrol 125 mg, Glucagon , 2 Liters IVF, and started on an Epinephrine  infusion. Labs revealed Hyponatremia 126, BUN 18, Creat 1.14, AST 43, ALT 45, total bili 1.7, Lactic acid 2.2, WBC 14.5, temp 100.3, INR 1.9. PCCM was consulted for admission  Pertinent  Medical History  Afib s/p watchman Recent UTI Hemorrhagic Stroke w/ subsequent focal seizures GERD Hypothyroidism  Significant Hospital Events: Including procedures, antibiotic start and stop dates in addition to other pertinent events   7/11: Admitted with Anaphylactic Shock. 2 Liters IVF, solumedrol, Pepcid , Epi pen x 2, glucagon . Was started on Epi drip and admitted to ICU  Interim History / Subjective:  7/11: BC x 2, UA, RVP  Objective    Blood pressure (!) 99/50, pulse (!) 105, temperature (!) 101 F (38.3 C), temperature  source Rectal, resp. rate (!) 22, height 5' 3 (1.6 m), weight 77.1 kg, SpO2 98%.       No intake or output data in the 24 hours ending 10/27/23 0522 Filed Weights   10/27/23 0342  Weight: 77.1 kg  Physical Exam Vitals and nursing note reviewed.  Constitutional:      Comments: NAD  HENT:     Head: Normocephalic and atraumatic.     Right Ear: External ear normal.     Left Ear: External ear normal.     Nose: Nose normal. No congestion or rhinorrhea.     Mouth/Throat:     Mouth: Mucous membranes are dry.     Comments: crusted Eyes:     Extraocular Movements: Extraocular movements intact.     Pupils: Pupils are equal, round, and reactive to light.     Comments: Eye edema, right eye with yellow exudate  Cardiovascular:     Pulses: Normal pulses.     Comments: Atrial fibrillation Pulmonary:     Effort: Pulmonary effort is normal.     Breath sounds: Normal breath sounds.  Abdominal:     General: Bowel sounds are normal.     Palpations: Abdomen is soft.  Musculoskeletal:        General: Normal range of motion.     Cervical back: Normal range of motion.  Skin:    Capillary Refill: Capillary refill takes less than 2 seconds.     Comments: Red raised rash to face and torso, Lesser degree to BUE, BLE (minimal to shins) Chin and upper lip  with yellow crusted areas  Neurological:     General: No focal deficit present.     Comments: Drowsy, oriented x 4, follows commands, AG x 4  Psychiatric:        Mood and Affect: Mood normal.    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 - Epinephrine  IM x 2, Glucagon , Pepcid , Solumedrol 125 mg in ER - Continue Epinephrine  infusion - Pepcid  20 mg BID - Solumedrol 60 mg Q 12 hours  Hypotension due to above Tachycardia  - MAP goal > 65, HR < 110  Leukocytosis Lactic  Acidosis Patient with fevers at home for past several days - s/p 2 liters IVF bolus - Trend lactic acid until clears - RVP, BC x 2, UA pending  Atrial Fibrillation s/p Watchman 09/21/2023 Prolonged QTc - Continue home Eliquis   - Resume home Metoprolol once off epinephrine  - EKG: Afib, Qtc 518 - Avoid Qtc prolonging medications  Hyponatremia AKI, creat 1.14 Baseline sodium 130-138 - Daily BMP, Repeat sodium at 0700 - Avoid Nephrotoxic medications  Focal Seizures - History - Topamax  100 mg daily, 200 mg nightly - holding as this medication is 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, resume pending further workup of Anaphylaxis  - Consider addition of ASM or Neurology consult  GERD - Pepcid   Best Practice (right click and Reselect all SmartList Selections daily)   Diet/type: NPO w/ water  and meds DVT prophylaxis DOAC Pressure ulcer(s): N/A GI prophylaxis: H2B Lines: N/A Foley:  N/A Code Status:  full code Last date of multidisciplinary goals of care discussion [7/11 in ER with patient and spouse]  Labs   CBC: Recent Labs  Lab 10/27/23 0349  WBC 14.5*  NEUTROABS 12.9*  HGB 14.9  HCT 44.9  MCV 97.0  PLT 187    Basic Metabolic Panel: Recent Labs  Lab 10/27/23 0349  NA 126*  K 4.0  CL 98  CO2 18*  GLUCOSE 121*  BUN 18  CREATININE 1.14*  CALCIUM  7.9*   GFR: Estimated Creatinine Clearance: 38.1 mL/min (A) (by C-G formula based on SCr of 1.14 mg/dL (H)). Recent Labs  Lab 10/27/23 0349  WBC 14.5*  LATICACIDVEN 2.2*   Liver Function Tests: Recent Labs  Lab 10/27/23 0349  AST 43*  ALT 45*  ALKPHOS 125  BILITOT 1.7*  PROT 5.6*  ALBUMIN 2.9*   Coagulation Profile: Recent Labs  Lab 10/27/23 0349  INR 1.9*   CBG: No results for input(s): GLUCAP in the last 168 hours.  Review of Systems:   Denies chest pain, shortness or breath, difficulty swallowing, abdominal pain, nausea, itching. 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   Surgical History:   Past Surgical History:  Procedure Laterality Date   APPENDECTOMY  1955   AUGMENTATION MAMMAPLASTY Bilateral 1975   silicone   BROW LIFT  Bilateral 06/07/2019   Procedure: BLEPHAROPLASTY UPPER EYELID WITH EXCESS SKIN;  Surgeon: Ashley Greig HERO, MD;  Location: [facility];  Service: Ophthalmology;  Laterality: Bilateral;   CATARACT EXTRACTION W/ INTRAOCULAR LENS IMPLANT Bilateral    left 06/ 2006;  right 08/ 2009   DILATATION & CURETTAGE/HYSTEROSCOPY WITH MYOSURE N/A 03/02/2021   Procedure: DILATATION & CURETTAGE/HYSTEROSCOPY WITH MYOSURE;  Surgeon: Rosalva Sawyer, MD;  Location: [facility];  Service: Gynecology;  Laterality: N/A;   DILATION AND CURETTAGE OF UTERUS  1972   LEFT ATRIAL APPENDAGE OCCLUSION N/A 09/21/2023   Procedure: LEFT ATRIAL APPENDAGE OCCLUSION;  Surgeon: Cindie Ole DASEN, MD;  Location: [facility] IN[facility]ASIVE CV [facility];  Service: Cardiovascular;  Laterality: N/A;   MELANOMA EXCISION  07/1997   left shoulder   ROBOTIC ASSISTED TOTAL HYSTERECTOMY WITH BILATERAL SALPINGO OOPHERECTOMY N/A 03/25/2021   Procedure: XI ROBOTIC ASSISTED TOTAL HYSTERECTOMY WITH BILATERAL SALPINGO OOPHORECTOMY;  Surgeon: Viktoria Comer SAUNDERS, MD;  Location: [facility];  Service: Gynecology;  Laterality: N/A;    SENTINEL NODE BIOPSY N/A 03/25/2021   Procedure: SENTINEL NODE BIOPSY;  Surgeon: Viktoria Comer SAUNDERS, MD;  Location: [facility];  Service: Gynecology;  Laterality: N/A;   thrombosed hemorrhoid  surgery     TONSILLECTOMY  1954   TRANSESOPHAGEAL ECHOCARDIOGRAM (CATH LAB) N/A 09/21/2023   Procedure: TRANSESOPHAGEAL ECHOCARDIOGRAM;  Surgeon: Cindie Ole DASEN, MD;  Location: [facility] INVAS[facility] CV LAB;  Service: Cardiovascular;  Laterality: N/A;   TUBAL LIGATION  1975   VARICOSE VEIN SURGERY  1969   right leg   VULVA /PERINEUM BIOPSY N/A 03/25/2021   Procedure: VULVAR BIOPSY;  Surgeon: Viktoria Comer SAUNDERS, MD;  Location: [facility];  Service: Gynecology;  Laterality: N/A;     Social History:   reports that she quit smoking about 34 years ago. Her smoking use included cigarettes. She started smoking about 54 years ago. She has never used smokeless tobacco. She reports current alcohol use of about 7.0 standard drinks of alcohol per week. She reports that she does not use drugs.   Family History:  Her family history includes Atrial fibrillation in her father; Cancer in her maternal grandmother; Diabetes in her father; Heart attack in her father and mother; Lymphoma in her brother. There is no history of Seizures, Colon cancer, Breast cancer, Ovarian cancer, Endometrial cancer, Pancreatic cancer, Prostate cancer, or Stroke.   Allergies Allergies  Allergen Reactions   Citalopram     Sleepy, dull feeling   Augmentin [Amoxicillin-Pot Clavulanate] Diarrhea    severe   Bacitracin  Other (See Comments)   Doxycycline Diarrhea   Doxycycline Monohydrate Diarrhea   Moxifloxacin     Redness, non-healing (eye)   Moxifloxacin Hcl     Other reaction(s): redness (eyes)   Latex Rash    Bandaids/tape Other reaction(s): redness   Septra [Sulfamethoxazole-Trimethoprim] Rash   Tape Rash   Zithromax [Azithromycin] Rash     Home Medications  Prior to Admission medications   Medication Sig Start Date End Date Taking?  Authorizing Provider  atenolol  (TENORMIN ) 25 MG tablet Take 25 mg by mouth daily. 11/22/22   [provider]  clobetasol cream (TEMOVATE) 0.05 % Apply 1 application topically 2 (two) times a week.    [provider]  conjugated estrogens (PREMARIN) vaginal cream Place 1 applicator vaginally once a week.    [provider]  docusate sodium  (COLACE) 100 MG capsule Take 100 mg by mouth daily as needed for mild constipation.    [provider]  doxycycline (PERIOSTAT)  20 MG tablet Take 20 mg by mouth 2 (two) times daily.    [provider]  ELIQUIS  5 MG TABS tablet Take 5 mg by mouth 2 (two) times daily.    [provider]  famotidine  (PEPCID ) 40 MG tablet Take 40 mg by mouth 2 (two) times daily.    [provider]  furosemide  (LASIX ) 20 MG tablet Take 20 mg by mouth daily as needed for edema or fluid.    [provider]  LORazepam (ATIVAN) 1 MG tablet Take 1 mg by mouth at bedtime. 10/02/17   [provider]  metroNIDAZOLE (METROGEL) 0.75 % gel Apply 1 Application topically 2 (two) times daily. 06/21/23   [provider]  omeprazole (PRILOSEC) 20 MG capsule Take 20 mg by mouth daily.    [provider]  topiramate  (TOPAMAX ) 100 MG tablet TAKE 1 TABLET BY MOUTH IN THE MORNING AND 2 IN THE EVENING 07/18/23   Sherryl Bouchard, NP    Critical care time: 78 minutes

## 2023-10-27 NOTE — ED Provider Notes (Signed)
 [facility] Provider Note    Event Date/Time   First MD Initiated Contact with Patient 10/27/23 0340     (approximate)   History   Allergic Reaction   HPI  [name] is a 83 y.o. female   Past medical history of paroxysmal atrial fibrillation on Eliquis  with watchman, recent urinary tract infection and completed antibiotics a couple days ago Macrobid, who presents to the Emergency Department with rash and hypotension.  Yesterday afternoon she developed a raised red patchy rash to her torso and arms and her face started swelling up and she was seen at urgent care on Thursday evening and took Benadryl, but at home she got worse, feeling very fatigued, unable to get up from the bed, and the rash got worse.  She called for EMS.  Her husband was with her the whole time.  She never passed out.  She never sustained trauma.  EMS found her to be hypotensive 80s over 40s and a heart rate elevated in the 130s, but normal oxygen, gave 0.3 mg of epi IM and 50 mg of IV Benadryl.  Her blood pressure got better.  When she arrived to the emergency department her blood pressure was rechecked and back down to the 60s over 30s.  Independent Historian contributed to assessment above: Husband gives information as above    Physical Exam   Triage Vital Signs: ED Triage Vitals  Encounter Vitals Group     BP 10/27/23 0330 (!) 75/48     Girls Systolic BP Percentile --      Girls Diastolic BP Percentile --      Boys Systolic BP Percentile --      Boys Diastolic BP Percentile --      Pulse Rate 10/27/23 0330 (!) 118     Resp 10/27/23 0330 (!) 28     Temp 10/27/23 0330 100.3 F (37.9 C)     Temp Source 10/27/23 0330 Oral     SpO2 10/27/23 0320 94 %     Weight 10/27/23 0342 170 lb (77.1 kg)     Height 10/27/23 0342 5' 3 (1.6 m)     Head Circumference --      Peak Flow --      Pain Score 10/27/23 0342 0     Pain Loc --      Pain Education --      Exclude from  Growth Chart --     Most recent vital signs: Vitals:   10/27/23 0455 10/27/23 0500  BP: (!) 89/59 (!) 99/50  Pulse: 80 (!) 105  Resp: (!) 22 (!) 22  Temp:    SpO2: 98% 98%    General: Awake, no distress.  CV:  Good peripheral perfusion.  Resp:  Normal effort.  Abd:  No distention.  Other:  She has a swollen red face.  She has no significant tongue swelling or lip swelling though her lips are cracked.  There are no other intraoral lesions apparent.  She appears tired but is answering all of my questions appropriately and is awake alert communicative.  She does have a raised red splotchy rash throughout her torso and upper extremities bilaterally.  None on her legs.  She has no wheezing.  She has a soft abdomen without tenderness.   ED Results / Procedures / Treatments   Labs (all labs ordered are listed, but only abnormal results are displayed) Labs Reviewed  LACTIC ACID, PLASMA - Abnormal; Notable for the following components:  Result Value   Lactic Acid, Venous 2.2 (*)    All other components within normal limits  COMPREHENSIVE METABOLIC PANEL WITH GFR - Abnormal; Notable for the following components:   Sodium 126 (*)    CO2 18 (*)    Glucose, Bld 121 (*)    Creatinine, Ser 1.14 (*)    Calcium  7.9 (*)    Total Protein 5.6 (*)    Albumin 2.9 (*)    AST 43 (*)    ALT 45 (*)    Total Bilirubin 1.7 (*)    GFR, Estimated 48 (*)    All other components within normal limits  CBC WITH DIFFERENTIAL/PLATELET - Abnormal; Notable for the following components:   WBC 14.5 (*)    Neutro Abs 12.9 (*)    Abs Immature Granulocytes 0.17 (*)    All other components within normal limits  PROTIME-INR - Abnormal; Notable for the following components:   Prothrombin Time 23.0 (*)    INR 1.9 (*)    All other components within normal limits  CULTURE, BLOOD (ROUTINE X 2)  CULTURE, BLOOD (ROUTINE X 2)  RESPIRATORY PANEL BY PCR  SEDIMENTATION RATE  LACTIC ACID, PLASMA  URINALYSIS, W/  REFLEX TO CULTURE (INFECTION SUSPECTED)  C-REACTIVE PROTEIN  CBC  BASIC METABOLIC PANEL WITH GFR  MAGNESIUM   PHOSPHORUS  LACTIC ACID, PLASMA  ELECTROLYTE PANEL     I ordered and reviewed the above labs they are notable for her lactic acid is slightly elevated 2.2.  Her sodium is 126.  White blood cell count is 14.5.  EKG  ED ECG REPORT I, Ginnie Shams, the attending physician, personally viewed and interpreted this ECG.   Date: 10/27/2023  EKG Time: 0352  Rate: 119  Rhythm: Atrial fibrillation with RVR  Axis: nl  ST&T Change: No STEMI    RADIOLOGY I independently reviewed and interpreted chest x-ray and I see no obvious focality pneumothorax I also reviewed radiologist's formal read.   PROCEDURES:  Critical Care performed: Yes, see critical care procedure note(s)  .Critical Care  Performed by: Shams Ginnie, MD Authorized by: Shams Ginnie, MD   Critical care provider statement:    Critical care time (minutes):  50   Critical care was time spent personally by me on the following activities:  Development of treatment plan with patient or surrogate, discussions with consultants, evaluation of patient's response to treatment, examination of patient, ordering and review of laboratory studies, ordering and review of radiographic studies, ordering and performing treatments and interventions, pulse oximetry, re-evaluation of patient's condition and review of old charts    MEDICATIONS ORDERED IN ED: Medications  EPINEPHrine  (ADRENALIN ) 5 mg in NS 250 mL (0.02 mg/mL) premix infusion (1.5 mcg/min Intravenous Rate/Dose Change 10/27/23 0456)  glucagon  (human recombinant) (GLUCAGEN ) 5 mg in dextrose  5 % 50 mL (0.1 mg/mL) infusion (1 mg/hr Intravenous New Bag/Given 10/27/23 0455)  docusate sodium  (COLACE) capsule 100 mg (has no administration in time range)  polyethylene glycol (MIRALAX  / GLYCOLAX ) packet 17 g (has no administration in time range)  ondansetron  (ZOFRAN ) injection 4 mg  (has no administration in time range)  insulin  aspart (novoLOG ) injection 0-15 Units (has no administration in time range)  famotidine  (PEPCID ) IVPB 20 mg premix (has no administration in time range)  methylPREDNISolone  sodium succinate (SOLU-MEDROL ) 125 mg/2 mL injection 60 mg (has no administration in time range)  methylPREDNISolone  sodium succinate (SOLU-MEDROL ) 125 mg/2 mL injection 125 mg (125 mg Intravenous Given 10/27/23 0335)  EPINEPHrine  (EPI-PEN) injection 0.3 mg (  0.3 mg Intramuscular Given 10/27/23 0329)  famotidine  (PEPCID ) IVPB 20 mg premix (0 mg Intravenous Stopped 10/27/23 0417)  sodium chloride  0.9 % bolus 2,000 mL (2,000 mLs Intravenous New Bag/Given 10/27/23 0325)  glucagon  (human recombinant) (GLUCAGEN ) injection 1 mg (1 mg Intravenous Given 10/27/23 0358)    External physician / consultants:  I spoke with ICU consulted Dawna Metro regarding care plan for this patient.   IMPRESSION / MDM / ASSESSMENT AND PLAN / ED COURSE  I reviewed the triage vital signs and the nursing notes.                                Patient's presentation is most consistent with acute presentation with potential threat to life or bodily function.  Differential diagnosis includes, but is not limited to, anaphylaxis, allergic reaction, sepsis, SJS, ten,dress,   The patient is on the cardiac monitor to evaluate for evidence of arrhythmia and/or significant heart rate changes.  MDM:    This the patient with what appears to be a anaphylactic allergic reaction though the inciting exposure is unknown.  She took some Benadryl last evening but this was already at the beginning of onset of her symptoms.  She just finished a course of Macrobid several days before.  She does have a swollen red face and splotchy red rash throughout her body and hypotension which was responsive briefly to epinephrine  IM x 2 but these improvements have been transient and her blood pressure quickly drops again, so I have  ordered for her to liters of normal saline pressure bag, and I have started her on a epinephrine  infusion.  She is also on atenolol  so I have given her glucagon  bolus and infusion as well.  After starting on the epinephrine  infusion the redness on her face and the rash on her body have started to improve.  I think this makes anaphylaxis/allergic reaction more likely though SJS/TEN/dress dermatologic conditions are still on the differential.   Her blood pressure has maintained with maps above 65.  I have consulted with ICU for admission.   FINAL CLINICAL IMPRESSION(S) / ED DIAGNOSES   Final diagnoses:  Anaphylaxis, initial encounter  Rash  Hypotension, unspecified hypotension type     Rx / DC Orders   ED Discharge Orders     None        Note:  This document was prepared using Dragon voice recognition software and may include unintentional dictation errors.    Cyrena Mylar, MD 10/27/23 [phone]

## 2023-10-27 NOTE — Plan of Care (Signed)
 Continuing with plan of care.

## 2023-10-27 NOTE — ED Triage Notes (Signed)
 Pt brought in by EMS for an allergic reaction.  Pt has been taking macrobid for a UTI for the past 7 days and developed swelling, hives.  She went to urgent care on Thursday and was told to go home and take benadryl.  Pt was initially hypotensive for EMS at 80/40, HR ranging from 90-130bpm, 95% on RA.  EMS gave 0.3mg  epi IM as well as 50mg  of benadry IVP.  Upon arrival, pt is lethargic, but responsive.  Pt face looks red and swollen. Per EMS pt looks better than when they initially arrived on scene. In ED, BP initially 62/36, HR 120s, Temp 100.3.

## 2023-10-27 NOTE — Consult Note (Signed)
 PHARMACY CONSULT NOTE - ELECTROLYTES  Pharmacy Consult for Electrolyte Monitoring and Replacement   Recent Labs: Potassium (mmol/L)  Date Value  10/27/2023 2.7 (LL)  10/27/2023 2.8 (L)   Magnesium  (mg/dL)  Date Value  92/88/7974 1.7   Calcium  (mg/dL)  Date Value  92/88/7974 7.8 (L)   Albumin (g/dL)  Date Value  92/88/7974 2.9 (L)   Phosphorus (mg/dL)  Date Value  92/88/7974 1.6 (L)   Sodium (mmol/L)  Date Value  10/27/2023 129 (L)  10/27/2023 130 (L)  09/19/2023 133 (L)    Height: 5' 3 (160 cm) Weight: 77.1 kg (170 lb) IBW/kg (Calculated) : 52.4 Estimated Creatinine Clearance: 40.9 mL/min (A) (by C-G formula based on SCr of 1.06 mg/dL (H)).  Assessment  MAYMUNA DETZEL is a 83 y.o. female presenting with anaphylactic shock. PMH significant for Afib s/p Watchman 09/2023, UTI, hemorrhagic stroke, focal seizures, GERD, and hypothyroidism. Pharmacy has been consulted to monitor and replace electrolytes.  Diet: NPO, ice chips & sips w/ meds MIVF: N/A, s/p fluid boluses Pertinent medications: Kcl 40 mEq q4h x 2 doses, magnesium  sulfate 2 g IV x 1, sodium phosphate  30 mmol IV x 1, calcium  gluconate 2 g IV x 1  Goal of Therapy: Electrolytes within normal limits  Plan:  Repeat BMP is ordered for 11 & all other electrolytes for tomorrow AM. Will follow  Thank you for allowing pharmacy to be a part of this patient's care.  Marolyn KATHEE Mare 10/27/2023 8:26 AM

## 2023-10-28 DIAGNOSIS — T782XXA Anaphylactic shock, unspecified, initial encounter: Secondary | ICD-10-CM

## 2023-10-28 LAB — CBC
HCT: 40 % (ref 36.0–46.0)
Hemoglobin: 13.5 g/dL (ref 12.0–15.0)
MCH: 32.3 pg (ref 26.0–34.0)
MCHC: 33.8 g/dL (ref 30.0–36.0)
MCV: 95.7 fL (ref 80.0–100.0)
Platelets: 177 K/uL (ref 150–400)
RBC: 4.18 MIL/uL (ref 3.87–5.11)
RDW: 13.5 % (ref 11.5–15.5)
WBC: 15.4 K/uL — ABNORMAL HIGH (ref 4.0–10.5)
nRBC: 0 % (ref 0.0–0.2)

## 2023-10-28 LAB — BASIC METABOLIC PANEL WITH GFR
Anion gap: 9 (ref 5–15)
BUN: 23 mg/dL (ref 8–23)
CO2: 17 mmol/L — ABNORMAL LOW (ref 22–32)
Calcium: 8.8 mg/dL — ABNORMAL LOW (ref 8.9–10.3)
Chloride: 108 mmol/L (ref 98–111)
Creatinine, Ser: 0.78 mg/dL (ref 0.44–1.00)
GFR, Estimated: >60 mL/min (ref >60)
Glucose, Bld: 108 mg/dL — ABNORMAL HIGH (ref 70–99)
Potassium: 3.4 mmol/L — ABNORMAL LOW (ref 3.5–5.1)
Sodium: 134 mmol/L — ABNORMAL LOW (ref 135–145)

## 2023-10-28 LAB — GLUCOSE, CAPILLARY
Glucose-Capillary: 100 mg/dL — ABNORMAL HIGH (ref 70–99)
Glucose-Capillary: 99 mg/dL (ref 70–99)

## 2023-10-28 LAB — MAGNESIUM: Magnesium: 2.4 mg/dL (ref 1.7–2.4)

## 2023-10-28 LAB — PHOSPHORUS: Phosphorus: 2.2 mg/dL — ABNORMAL LOW (ref 2.5–4.6)

## 2023-10-28 MED ORDER — K PHOS MONO-SOD PHOS DI & MONO 155-852-130 MG PO TABS
500.0000 mg | ORAL_TABLET | ORAL | Status: AC
  Administered 2023-10-28 (×2): 500 mg via ORAL
  Filled 2023-10-28 (×2): qty 2

## 2023-10-28 MED ORDER — PREDNISONE 10 MG PO TABS: ORAL_TABLET | ORAL | 0 refills | Status: AC

## 2023-10-28 MED ORDER — LOPERAMIDE HCL 2 MG PO CAPS
4.0000 mg | ORAL_CAPSULE | Freq: Once | ORAL | Status: AC
  Administered 2023-10-28: 4 mg via ORAL
  Filled 2023-10-28: qty 2

## 2023-10-28 MED ORDER — ATENOLOL 25 MG PO TABS: 12.5000 mg | ORAL_TABLET | Freq: Every day | ORAL | Status: AC

## 2023-10-28 MED ORDER — EPINEPHRINE 0.3 MG/0.3ML IJ SOAJ: 0.3000 mg | INTRAMUSCULAR | 1 refills | Status: AC | PRN

## 2023-10-28 NOTE — Evaluation (Signed)
 Physical Therapy Evaluation Patient Details Name: [name] MRN: [id number] DOB: [birth year] Today's Date: 10/28/2023  History of Present Illness  Pt admitted to [facility] on 10/27/23 for anaphylactic shock. Recently started on macrobid for UTI and developed swelling and hives. Significant PMH includes: Afib s/p Watchman 09/2023, Hemorrhagic stroke, Focal seizures following CVA, GERD, and Hypothyroidism.   Clinical Impression  Pt seated EOB upon entry with spouse at bedside; agreeable for PT evaluation and requesting assistance to bathroom for toileting. At baseline, pt is IND with ADL's, IADL's, ambulation without AD (limited community ambulation 5-10min), driving, and medication management.   Pt presents with decreased activity tolerance and mild standing balance deficits, resulting in impaired functional mobility from baseline. Due to deficits, pt required mod I for bed mobility, CGA-supervision for transfers, and CGA-SBA for ambulation to/from bathroom without AD. She demonstrates improvements in assist levels, balance, and quality of gait with repetition. Deficits expected to improve with continued OOB mobility.   Deficits limit the pt's ability to safely and independently perform ADL's, transfer, and ambulate. Pt will benefit from acute skilled PT services to address deficits for return to baseline function. Pt will benefit from post acute therapy services to address deficits for return to baseline function.         If plan is discharge home, recommend the following: A little help with bathing/dressing/bathroom;Assistance with cooking/housework;Help with stairs or ramp for entrance;Assist for transportation   Can travel by private vehicle    Yes    Equipment Recommendations BSC/3in1     Functional Status Assessment Patient has had a recent decline in their functional status and demonstrates the ability to make significant improvements in function in a reasonable and predictable amount  of time.     Precautions / Restrictions Precautions Precautions: Fall Recall of Precautions/Restrictions: Intact Restrictions Weight Bearing Restrictions Per Provider Order: No      Mobility  Bed Mobility Overal bed mobility: Modified Independent             General bed mobility comments: supine<>sit transfer, HOB elevated, use of BUE for support    Transfers                   General transfer comment: CGA to stand from EOB and sit on low height commode, no AD; progressing to supervision for safety to stand from low height commode and sit EOB, no AD. Demonstrates good eccentric control with UE support.    Ambulation/Gait   Gait Distance (Feet): 20 Feet (42ft x2)           General Gait Details: Initially CGA with L HHA to ambulate EOB>bathroom for toileting, demonstrates slowed cadence and decreased step length/foot clearance. Progressed to supervision to ambulate back to bed, no UE support, able to reach outside BOS for washing hands and throwing away trash without LOB.     Balance Overall balance assessment: Mild deficits observed, not formally tested. Reaches outside BOS in static standing without LOB.                                           Pertinent Vitals/Pain Pain Assessment Pain Assessment: No/denies pain    Home Living Family/patient expects to be discharged to:: Private residence Living Arrangements: Spouse/significant other Available Help at Discharge: Family;Available 24 hours/day Type of Home: House Home Access: Stairs to enter   [employer] of Steps: 1 STE  through garage, no railing   Home Layout: One level Home Equipment: Agricultural consultant (2 wheels)      Prior Function Prior Level of Function : Driving             Mobility Comments: Household ambulation without AD; limited community ambulation without AD (5-27min before needing rest break); no hx of falls ADLs Comments: IND ADL's, IADL's,  medication management     Extremity/Trunk Assessment   Upper Extremity Assessment Upper Extremity Assessment: Overall WFL for tasks assessed    Lower Extremity Assessment Lower Extremity Assessment: Overall WFL for tasks assessed       Communication        Cognition Arousal: Alert Behavior During Therapy: WFL for tasks assessed/performed   PT - Cognitive impairments: No apparent impairments                                       General Comments General comments (skin integrity, edema, etc.): generalized redness and hives from allergic reaction    Exercises Other Exercises Other Exercises: Pt and spouse educated re: PT role/POC, DC recommendations, energy conservation/pacing with DME, call for help, ambulation for maintenance of IND. they verbalized understanding.   Assessment/Plan    PT Assessment Patient needs continued PT services  PT Problem List Decreased activity tolerance;Decreased balance;Decreased mobility;Decreased skin integrity       PT Treatment Interventions DME instruction;Gait training;Stair training;Functional mobility training;Therapeutic activities;Balance training;Neuromuscular re-education    PT Goals (Current goals can be found in the Care Plan section)  Acute Rehab PT Goals Patient Stated Goal: go home PT Goal Formulation: With patient/family Time For Goal Achievement: 11/11/23 Potential to Achieve Goals: Good    Frequency Min 1X/week        AM-PAC PT 6 Clicks Mobility  Outcome Measure Help needed turning from your back to your side while in a flat bed without using bedrails?: None Help needed moving from lying on your back to sitting on the side of a flat bed without using bedrails?: None Help needed moving to and from a bed to a chair (including a wheelchair)?: A Little Help needed standing up from a chair using your arms (e.g., wheelchair or bedside chair)?: A Little Help needed to walk in hospital room?: A  Little Help needed climbing 3-5 steps with a railing? : A Little 6 Click Score: 20    End of Session Equipment Utilized During Treatment: Gait belt Activity Tolerance: Patient tolerated treatment well Patient left: in bed;with call bell/phone within reach;with family/visitor present (no chair alarm indicated; pt safe for amb in room with spouse)   PT Visit Diagnosis: Unsteadiness on feet (R26.81)    Time: 8595-8575 PT Time Calculation (min) (ACUTE ONLY): 20 min   Charges:   PT Evaluation $PT Eval Low Complexity: 1 Low PT Treatments $Therapeutic Activity: 8-22 mins PT General Charges $$ ACUTE PT VISIT: 1 Visit        Camie CHARLENA Kluver, PT, DPT 2:35 PM,10/28/23 Physical Therapist - [facility]

## 2023-10-28 NOTE — Discharge Summary (Addendum)
 Physician Discharge Summary   Patient: [name] MRN: [id number]  DOB: [birth year]   Admit:     Date of Admission: 10/27/2023 Admitted from: home   Discharge: Date of discharge: 10/28/23 Disposition: Home Condition at discharge: good  CODE STATUS: FULL CODE     Discharge Physician: Laneta Blunt, DO [facility]     PCP: Rexanne Ingle, MD  Recommendations for Outpatient Follow-up:  Follow up with PCP Rexanne Ingle, MD in 1-2 weeks Please obtain labs/tests: CBC, BMP in 1-2 weeks    Discharge Instructions     Diet general   Complete by: As directed    Increase activity slowly   Complete by: As directed          Discharge Diagnoses: Principal Problem:   Anaphylactic shock       Hospital course / significant events:   HPI: [name] is a 83 yo with a PMH significant for Afib s/p Watchman 09/2023, Hemorrhagic stroke, Focal seizures following CVA, GERD, and Hypothyroidism. Recently treated with Macrobid for a UTI, last dose 07/09 evening. She had new weakness onset 07/10, along with a red face, which progressed throughout the day to a raised red patchy rash to her face, arms, torso, and legs. She presented to Urgent care 07/10, was given Benadryl and Allegra, sent home. Worsening weakness and lethargy at home so EMS was called, BP was 80's/40's, tachy 130's, she was given Epi pen and 50 mg IV Benadryl. Arrived at [facility] ED 03:30 on 07/11. Husband states she has not taken any additional new medications aside from the Macrobid, no new foods, No gardening or outside work, no bug bites, no exposure to sick individuals.   07/11: to ED early AM - BP 60's/30's, received additional Epi pen, Pepcid , Solumedrol 125 mg, Glucagon , 2 Liters IVF, and started on an Epinephrine  infusion. Admitted for anaphylactic shock. Epi infusion dc 10:30 07/12: BP maintaining. Pt feeling significantly better today and requesting for dc home if at all possible, no SOB,  ambulating steadily with some assistance. OK for dc home, Rx for EpiPen  and steroid taper, instructions and return precautions provided w/ specific instructions on worsening rash / blistering      Consultants:  none  Procedures/Surgeries: none      ASSESSMENT & PLAN:   Anaphylactic Shock, Suspect medication induced Drug rash  Patient on Macrobid 7/2-7/9 Patient improved more rapidly than anticipated and stable for discharge at this time  Added Macrobid to allergy list Epipen  on discharge  Steroid taper on discharge     Leukocytosis Likely reactive / steroids  Monitor CBC outpatient    Atrial Fibrillation s/p Watchman 09/21/2023 Prolonged QTc Eliquis   Resume beta blocker lower dose given BP still on the lower side   Hyponatremia - resolved AKI, creat 1.14 - resolved Repeat BMP outpatient    Focal Seizures - History Topamax  100 mg daily, 200 mg nightly, some risk SJS w/ this but pt very reluctant to come off this Rx or pause it, will continue w/ caution   GERD Pepcid   Ambulatory dysfunction Debility The pt is temporarily confined to a single rm - BSC ordered, pt has walker at home     Class 1 obesity based on BMI: Body mass index is 31.28 kg/m.SABRA Significantly low or high BMI is associated with higher medical risk.  Underweight - under 18  overweight - 25 to 29 obese - 30 or more Class 1 obesity: BMI of 30.0 to 34 Class 2 obesity: BMI  of 35.0 to 39 Class 3 obesity: BMI of 40.0 to 49 Super Morbid Obesity: BMI 50-59 Super-super Morbid Obesity: BMI 60+ Healthy nutrition and physical activity advised as adjunct to other disease management and risk reduction treatments           Discharge Instructions  Allergies as of 10/28/2023       Reactions   Citalopram    Sleepy, dull feeling   Macrobid [nitrofurantoin Macrocrystal] Anaphylaxis   Hospitalization 10/2023   Augmentin [amoxicillin-pot Clavulanate] Diarrhea   severe   Bacitracin  Other (See  Comments)   Doxycycline Diarrhea   Doxycycline Monohydrate Diarrhea   Moxifloxacin    Redness, non-healing (eye)   Moxifloxacin Hcl    Other reaction(s): redness (eyes)   Latex Rash   Bandaids/tape Other reaction(s): redness   Septra [sulfamethoxazole-trimethoprim] Rash   Tape Rash   Zithromax [azithromycin] Rash        Medication List     PAUSE taking these medications    atenolol  25 MG tablet Wait to take this until your doctor or other care provider tells you to start again. Commonly known as: TENORMIN  Take 0.5 tablets (12.5 mg total) by mouth daily. What changed: how much to take       STOP taking these medications    doxycycline 20 MG tablet Commonly known as: PERIOSTAT       TAKE these medications    clobetasol cream 0.05 % Commonly known as: TEMOVATE Apply 1 application topically 2 (two) times a week.   docusate sodium  100 MG capsule Commonly known as: COLACE Take 100 mg by mouth daily as needed for mild constipation.   Eliquis  5 MG Tabs tablet Generic drug: apixaban  Take 5 mg by mouth 2 (two) times daily.   EPINEPHrine  0.3 mg/0.3 mL Soaj injection Commonly known as: EPI-PEN Inject 0.3 mg into the muscle as needed for anaphylaxis.   famotidine  40 MG tablet Commonly known as: PEPCID  Take 40 mg by mouth 2 (two) times daily.   furosemide  20 MG tablet Commonly known as: LASIX  Take 20 mg by mouth daily as needed for edema or fluid.   LORazepam 1 MG tablet Commonly known as: ATIVAN Take 1 mg by mouth at bedtime.   metroNIDAZOLE 0.75 % gel Commonly known as: METROGEL Apply 1 Application topically 2 (two) times daily.   omeprazole 20 MG capsule Commonly known as: PRILOSEC Take 20 mg by mouth daily.   predniSONE  10 MG tablet Commonly known as: DELTASONE  Take 5 tablets (50 mg total) by mouth daily with breakfast for 1 day, THEN 4 tablets (40 mg total) daily with breakfast for 1 day, THEN 3 tablets (30 mg total) daily with breakfast for 1  day, THEN 2 tablets (20 mg total) daily with breakfast for 1 day, THEN 1 tablet (10 mg total) daily with breakfast for 1 day, THEN 0.5 tablets (5 mg total) daily with breakfast for 1 day. Start taking on: October 29, 2023   Premarin vaginal cream Generic drug: conjugated estrogens Place 1 applicator vaginally once a week.   topiramate  100 MG tablet Commonly known as: TOPAMAX  TAKE 1 TABLET BY MOUTH IN THE MORNING AND 2 IN THE EVENING               Durable Medical Equipment  (From admission, onward)           Start     Ordered   10/28/23 1451  For home use only DME 3 n 1  Once  10/28/23 1450             Follow-up Information     Rexanne Ingle, MD. Schedule an appointment as soon as possible for a visit.   Specialty: Internal Medicine Why: hospital follow up Contact information: [address] [place] [address] [phone]                 Allergies  Allergen Reactions   Citalopram     Sleepy, dull feeling   Macrobid [Nitrofurantoin Macrocrystal] Anaphylaxis    Hospitalization 10/2023   Augmentin [Amoxicillin-Pot Clavulanate] Diarrhea    severe   Bacitracin  Other (See Comments)   Doxycycline Diarrhea   Doxycycline Monohydrate Diarrhea   Moxifloxacin     Redness, non-healing (eye)   Moxifloxacin Hcl     Other reaction(s): redness (eyes)   Latex Rash    Bandaids/tape Other reaction(s): redness   Septra [Sulfamethoxazole-Trimethoprim] Rash   Tape Rash   Zithromax [Azithromycin] Rash     Subjective: pt feeling much better today, tolerating diet, ambulating steadily, declined home health PT. No CP/SOB, rash is less red compared to yesterday    Discharge Exam: BP 136/79 (BP Location: Left Arm)   Pulse 84   Temp (!) 97.3 F (36.3 C) (Oral)   Resp 17   Ht 5' 3 (1.6 m)   Wt 80.1 kg   SpO2 99%   BMI 31.28 kg/m  General: Pt is alert, awake, not in acute distress Cardiovascular: RRR, S1/S2 +, no rubs, no  gallops Respiratory: CTA bilaterally, no wheezing, no rhonchi Skin: diffuse hives, facial redness, pt and husband reports voerall better compared to yesterday  Abdominal: Soft, NT, ND, bowel sounds + Extremities: no edema, no cyanosis     The results of significant diagnostics from this hospitalization (including imaging, microbiology, ancillary and laboratory) are listed below for reference.     Microbiology: Recent Results (from the past 240 hours)  Respiratory (~20 pathogens) panel by PCR     Status: None   Collection Time: 10/27/23  5:36 AM   Specimen: Nasopharyngeal Swab; Respiratory  Result Value Ref Range Status   Adenovirus NOT DETECTED NOT DETECTED Final   Coronavirus 229E NOT DETECTED NOT DETECTED Final    Comment: (NOTE) The Coronavirus on the Respiratory Panel, DOES NOT test for the novel  Coronavirus (2019 nCoV)    Coronavirus HKU1 NOT DETECTED NOT DETECTED Final   Coronavirus NL63 NOT DETECTED NOT DETECTED Final   Coronavirus OC43 NOT DETECTED NOT DETECTED Final   Metapneumovirus NOT DETECTED NOT DETECTED Final   Rhinovirus / Enterovirus NOT DETECTED NOT DETECTED Final   Influenza A NOT DETECTED NOT DETECTED Final   Influenza B NOT DETECTED NOT DETECTED Final   Parainfluenza Virus 1 NOT DETECTED NOT DETECTED Final   Parainfluenza Virus 2 NOT DETECTED NOT DETECTED Final   Parainfluenza Virus 3 NOT DETECTED NOT DETECTED Final   Parainfluenza Virus 4 NOT DETECTED NOT DETECTED Final   Respiratory Syncytial Virus NOT DETECTED NOT DETECTED Final   Bordetella pertussis NOT DETECTED NOT DETECTED Final   Bordetella Parapertussis NOT DETECTED NOT DETECTED Final   Chlamydophila pneumoniae NOT DETECTED NOT DETECTED Final   Mycoplasma pneumoniae NOT DETECTED NOT DETECTED Final    Comment: Performed at [facility], [address] [place], [place] [address]  MRSA Next Gen by PCR, Nasal     Status: None   Collection Time: 10/27/23  9:17 AM   Specimen: Nasal Mucosa;  Nasal Swab  Result  Value Ref Range Status   MRSA by PCR Next Gen NOT DETECTED NOT DETECTED Final    Comment: (NOTE) The GeneXpert MRSA Assay (FDA approved for NASAL specimens only), is one component of a comprehensive MRSA colonization surveillance program. It is not intended to diagnose MRSA infection nor to guide or monitor treatment for MRSA infections. Test performance is not FDA approved in patients less than 19 years old. Performed at [facility], [address] [place], [place] [address]   Urine Culture     Status: None (Preliminary result)   Collection Time: 10/27/23  9:26 AM   Specimen: Urine, Random  Result Value Ref Range Status   Specimen Description   Final    URINE, RANDOM Performed at [facility], [address], [place], [place] [address]    Special Requests   Final    NONE Reflexed from [id number] Performed at [facility], [address] [place], [place] [address]    Culture   Final    CULTURE REINCUBATED FOR BETTER GROWTH Performed at [facility], [address] [place], [place] [address]    Report Status PENDING  Incomplete     Labs: BNP (last 3 results) No results for input(s): BNP in the last 8760 hours. Basic Metabolic Panel: Recent Labs  Lab 10/27/23 0349 10/27/23 0536 10/27/23 1542 10/28/23 0530  NA 126* 129*  130* 132* 134*  K 4.0 2.7*  2.8* 3.5 3.4*  CL 98 99  100 108 108  CO2 18* 17*  18* 16* 17*  GLUCOSE 121* 187* 132* 108*  BUN 18 17 17 23   CREATININE 1.14* 1.06* 0.59 0.78  CALCIUM  7.9* 7.8* 8.1* 8.8*  MG  --  1.7  --  2.4  PHOS  --  1.6*  --  2.2*   Liver Function Tests: Recent Labs  Lab 10/27/23 0349  AST 43*  ALT 45*  ALKPHOS 125  BILITOT 1.7*  PROT 5.6*  ALBUMIN 2.9*   No results for input(s): LIPASE, AMYLASE in the last 168 hours. No results for input(s): AMMONIA in the last 168 hours. CBC: Recent Labs  Lab 10/27/23 0349 10/27/23 0536 10/28/23 0530  WBC 14.5*  19.6* 15.4*  NEUTROABS 12.9*  --   --   HGB 14.9 13.8 13.5  HCT 44.9 40.8 40.0  MCV 97.0 95.1 95.7  PLT 187 192 177   Cardiac Enzymes: No results for input(s): CKTOTAL, CKMB, CKMBINDEX, TROPONINI in the last 168 hours. BNP: Invalid input(s): POCBNP CBG: Recent Labs  Lab 10/27/23 1619 10/27/23 1932 10/27/23 2330 10/28/23 0343 10/28/23 0735  GLUCAP 108* 158* 130* 100* 99   D-Dimer No results for input(s): DDIMER in the last 72 hours. Hgb A1c No results for input(s): HGBA1C in the last 72 hours. Lipid Profile No results for input(s): CHOL, HDL, LDLCALC, TRIG, CHOLHDL, LDLDIRECT in the last 72 hours. Thyroid  function studies No results for input(s): TSH, T4TOTAL, T3FREE, THYROIDAB in the last 72 hours.  Invalid input(s): FREET3 Anemia work up No results for input(s): VITAMINB12, FOLATE, FERRITIN, TIBC, IRON, RETICCTPCT in the last 72 hours. Urinalysis    Component Value Date/Time   COLORURINE YELLOW (A) 10/27/2023 0926   APPEARANCEUR HAZY (A) 10/27/2023 0926   LABSPEC 1.008 10/27/2023 0926   PHURINE 6.0 10/27/2023 0926   GLUCOSEU 150 (A) 10/27/2023 0926   HGBUR SMALL (A) 10/27/2023 0926   BILIRUBINUR NEGATIVE 10/27/2023 0926   KETONESUR 20 (A) 10/27/2023 0926   PROTEINUR NEGATIVE 10/27/2023 9073  NITRITE NEGATIVE 10/27/2023 0926   LEUKOCYTESUR MODERATE (A) 10/27/2023 0926   Sepsis Labs Recent Labs  Lab 10/27/23 0349 10/27/23 0536 10/28/23 0530  WBC 14.5* 19.6* 15.4*   Microbiology Recent Results (from the past 240 hours)  Respiratory (~20 pathogens) panel by PCR     Status: None   Collection Time: 10/27/23  5:36 AM   Specimen: Nasopharyngeal Swab; Respiratory  Result Value Ref Range Status   Adenovirus NOT DETECTED NOT DETECTED Final   Coronavirus 229E NOT DETECTED NOT DETECTED Final    Comment: (NOTE) The Coronavirus on the Respiratory Panel, DOES NOT test for the novel  Coronavirus (2019 nCoV)     Coronavirus HKU1 NOT DETECTED NOT DETECTED Final   Coronavirus NL63 NOT DETECTED NOT DETECTED Final   Coronavirus OC43 NOT DETECTED NOT DETECTED Final   Metapneumovirus NOT DETECTED NOT DETECTED Final   Rhinovirus / Enterovirus NOT DETECTED NOT DETECTED Final   Influenza A NOT DETECTED NOT DETECTED Final   Influenza B NOT DETECTED NOT DETECTED Final   Parainfluenza Virus 1 NOT DETECTED NOT DETECTED Final   Parainfluenza Virus 2 NOT DETECTED NOT DETECTED Final   Parainfluenza Virus 3 NOT DETECTED NOT DETECTED Final   Parainfluenza Virus 4 NOT DETECTED NOT DETECTED Final   Respiratory Syncytial Virus NOT DETECTED NOT DETECTED Final   Bordetella pertussis NOT DETECTED NOT DETECTED Final   Bordetella Parapertussis NOT DETECTED NOT DETECTED Final   Chlamydophila pneumoniae NOT DETECTED NOT DETECTED Final   Mycoplasma pneumoniae NOT DETECTED NOT DETECTED Final    Comment: Performed at [facility], [address] [place], [place] [address]  MRSA Next Gen by PCR, Nasal     Status: None   Collection Time: 10/27/23  9:17 AM   Specimen: Nasal Mucosa; Nasal Swab  Result Value Ref Range Status   MRSA by PCR Next Gen NOT DETECTED NOT DETECTED Final    Comment: (NOTE) The GeneXpert MRSA Assay (FDA approved for NASAL specimens only), is one component of a comprehensive MRSA colonization surveillance program. It is not intended to diagnose MRSA infection nor to guide or monitor treatment for MRSA infections. Test performance is not FDA approved in patients less than 74 years old. Performed at [facility], [address], [place], [place] [address]   Urine Culture     Status: None (Preliminary result)   Collection Time: 10/27/23  9:26 AM   Specimen: Urine, Random  Result Value Ref Range Status   Specimen Description   Final    URINE, RANDOM Performed at [facility], [address], [place], [place] [address]    Special Requests   Final    NONE Reflexed from  [id number] Performed at [facility], [address], [place], [place] [address]    Culture   Final    CULTURE REINCUBATED FOR BETTER GROWTH Performed at [facility], [address], [place], [place] [address]    Report Status PENDING  Incomplete   Imaging DG Chest Port 1 View Result Date: 10/27/2023 CLINICAL DATA:  Questionable sepsis. EXAM: PORTABLE CHEST 1 VIEW COMPARISON:  Chest PA Lat 09/21/2023 FINDINGS: The heart is slightly enlarged. Interval new left atrial appendage occlusion device. Central vessels are normal caliber. There are increased interstitial markings in the bases which could be due to decreased inspiration, pneumonitis or mild edema. Rest of the lungs are generally clear. No substantial pleural effusion is seen. The mediastinum is stable with aortic atherosclerosis. Overlying monitor wires. Osteopenia and  degenerative change thoracic spine. IMPRESSION: 1. Increased interstitial markings in the bases which could be due to decreased inspiration, pneumonitis or mild edema. Follow-up study recommended in full inspiration. 2. Mild cardiomegaly. 3. Interval new left atrial appendage occlusion device. Electronically Signed   By: Francis Quam M.D.   On: 10/27/2023 05:07      Time coordinating discharge: over 30 minutes  SIGNED:  Jaysiah Marchetta DO [facility]

## 2023-10-28 NOTE — TOC Transition Note (Signed)
 Transition of Care [facility]) - Discharge Note   Patient Details  Name: [name] MRN: [id number] Date of Birth: [birth year]  Transition [facility]) CM/SW Contact:  Marinda Cooks, RN Phone Number: 10/28/2023, 3:58 PM   Clinical Narrative:    This CM updated by covering MD pt medically cleared to dc today and has active DC order . This CM coordinated DME with [employer] to be delivered to her home.  DC transportation confirmed for pt with husband  Medical team updated . No additional DC needs requested by medical team or identified by CM at this time .     Final next level of care: Home w Home Health Services       Name of family member notified: Patient Patient and family notified of of transfer: 10/28/23  Discharge Plan and Services Additional resources added to the After Visit Summary for                  DME Arranged: 3-N-1 DME Agency: [employer] Date DME Agency Contacted: 10/28/23 Time DME Agency Contacted: 1000 Representative spoke with at DME Agency: Aida            Social Drivers of Health (SDOH) Interventions SDOH Screenings   Food Insecurity: No Food Insecurity (10/27/2023)  Housing: Low Risk  (10/27/2023)  Transportation Needs: No Transportation Needs (10/27/2023)  Utilities: Not At Risk (10/27/2023)  Social Connections: Socially Integrated (10/27/2023)  Tobacco Use: Medium Risk (10/27/2023)     Readmission Risk Interventions     No data to display

## 2023-10-28 NOTE — Hospital Course (Addendum)
 Hospital course / significant events:   HPI: [name] is a 83 yo with a PMH significant for Afib s/p Watchman 09/2023, Hemorrhagic stroke, Focal seizures following CVA, GERD, and Hypothyroidism. Recently treated with Macrobid for a UTI, last dose 07/09 evening. She had new weakness onset 07/10, along with a red face, which progressed throughout the day to a raised red patchy rash to her face, arms, torso, and legs. She presented to Urgent care 07/10, was given Benadryl and Allegra, sent home. Worsening weakness and lethargy at home so EMS was called, BP was 80's/40's, tachy 130's, she was given Epi pen and 50 mg IV Benadryl. Arrived at [facility] ED 03:30 on 07/11. Husband states she has not taken any additional new medications aside from the Macrobid, no new foods, No gardening or outside work, no bug bites, no exposure to sick individuals.   07/11: to ED early AM - BP 60's/30's, received additional Epi pen, Pepcid , Solumedrol 125 mg, Glucagon , 2 Liters IVF, and started on an Epinephrine  infusion. Admitted for anaphylactic shock. Epi infusion dc 10:30 07/12: BP maintaining. Pt requesting for dc home if at all possible, no SOB, ambulating steadily with some assistance. OK for dc home, Rx for EpiPen  and steroid taper, instructions and return precautions provided w/ specific instructions on worsening rash / blistering      Consultants:  none  Procedures/Surgeries: none      ASSESSMENT & PLAN:   Anaphylactic Shock, Suspect medication induced Drug rash  Patient on Macrobid 7/2-7/9 Added Macrobid to allergy list Epipen  on discharge  Steroid taper on discharge     Leukocytosis Likely reactive / steroids  Monitor CBC outpatient    Atrial Fibrillation s/p Watchman 09/21/2023 Prolonged QTc Eliquis   Resume beta blocker lower dose given BP still on the lower side   Hyponatremia - resolved AKI, creat 1.14 - resolved Repeat BMP outpatient    Focal Seizures - History Topamax  100 mg daily,  200 mg nightly, some risk SJS w/ this but pt very reluctant to come off this Rx or pause it, will continue w/ caution   GERD Pepcid      Class 1 obesity based on BMI: Body mass index is 31.28 kg/m.SABRA Significantly low or high BMI is associated with higher medical risk.  Underweight - under 18  overweight - 25 to 29 obese - 30 or more Class 1 obesity: BMI of 30.0 to 34 Class 2 obesity: BMI of 35.0 to 39 Class 3 obesity: BMI of 40.0 to 49 Super Morbid Obesity: BMI 50-59 Super-super Morbid Obesity: BMI 60+ Healthy nutrition and physical activity advised as adjunct to other disease management and risk reduction treatments

## 2023-10-28 NOTE — Plan of Care (Signed)

## 2023-10-28 NOTE — Progress Notes (Signed)
 Patient discharged home with spouse and has no further questions. Patient states she has had 3 episodes of loose stool within the hour. MD made aware and per MD to give one time dose of imodium . See MAR. Patient has all belongings, and to be transported to lobby via wheelchair by staff. Spouse driving patient home.

## 2023-10-28 NOTE — Plan of Care (Signed)
Patient discharged. Goals met

## 2023-10-29 NOTE — Progress Notes (Signed)
 The Beneficiary is confined to a single rm and requires bedside commode

## 2023-10-30 LAB — URINE CULTURE: Culture: 30000 — AB

## 2023-11-02 DIAGNOSIS — T782XXA Anaphylactic shock, unspecified, initial encounter: Secondary | ICD-10-CM | POA: Diagnosis not present

## 2023-11-02 LAB — CULTURE, BLOOD (ROUTINE X 2)
Culture: NO GROWTH
Culture: NO GROWTH

## 2023-11-02 NOTE — Progress Notes (Unsigned)
  Electrophysiology Office Note:   Date:  11/03/2023  ID:  ZERIAH BAYSINGER, DOB 1941-03-05, MRN [id number]  Primary Cardiologist: Lonni Cash, MD Electrophysiologist: OLE ONEIDA HOLTS, MD      History of Present Illness:   [name] is a 83 y.o. female with h/o AF and Secondary hypercoagulable state s/p Watchman procedure seen today for routine electrophysiology follow-up s/p Watchman implantation.  Since last being seen in our clinic the patient reports doing well from a cardiac perspective. Admitted to [facility] last week with anaphylactic reaction to Macrobid. Otherwise, she denies chest pain, palpitations, dyspnea, PND, orthopnea, nausea, vomiting, dizziness, syncope, edema, weight gain, or early satiety.    Review of systems complete and found to be negative unless listed in HPI.   EP Information / Studies Reviewed:    EKG is not ordered today. EKG from 10/27/2023 reviewed which showed AF in 100s       Arrhythmia/Device History No specialty comments available.   Physical Exam:   VS:  BP 98/60 (BP Location: Right Arm, Patient Position: Sitting, Cuff Size: Normal)   Pulse 95   Ht 5' 3 (1.6 m)   Wt 174 lb (78.9 kg)   SpO2 99%   BMI 30.82 kg/m    Wt Readings from Last 3 Encounters:  11/03/23 174 lb (78.9 kg)  10/28/23 176 lb 9.4 oz (80.1 kg)  09/21/23 172 lb (78 kg)     GEN: No acute distress NECK: No JVD; No carotid bruits CARDIAC: Irregular rate and rhythm, no murmurs, rubs, gallops RESPIRATORY:  Clear to auscultation without rales, wheezing or rhonchi  ABDOMEN: Soft, non-tender, non-distended EXTREMITIES:  No edema; No deformity   ASSESSMENT AND PLAN:    Longstanding persistent Atrial fibrillation S/p Watchman 09/21/2023 CT planned for 11/22/2023 After 11/05/2023 transition from Eliquis  to Plavix 75 mg daily.  Continue atenolol  for rate control  HTN Stable on current regimen    Follow up with EP Team in December in [place] for 6 month post op  check.   Signed, Ozell Prentice Passey, PA-C

## 2023-11-03 ENCOUNTER — Telehealth: Payer: Self-pay | Admitting: Cardiology

## 2023-11-03 ENCOUNTER — Ambulatory Visit: Attending: Student | Admitting: Student

## 2023-11-03 ENCOUNTER — Encounter: Payer: Self-pay | Admitting: Student

## 2023-11-03 VITALS — BP 98/60 | HR 95 | Ht 63.0 in | Wt 174.0 lb

## 2023-11-03 DIAGNOSIS — I4891 Unspecified atrial fibrillation: Secondary | ICD-10-CM | POA: Diagnosis not present

## 2023-11-03 DIAGNOSIS — Z95818 Presence of other cardiac implants and grafts: Secondary | ICD-10-CM

## 2023-11-03 MED ORDER — CLOPIDOGREL BISULFATE 75 MG PO TABS: 75.0000 mg | ORAL_TABLET | Freq: Every day | ORAL | 1 refills | Status: DC

## 2023-11-03 NOTE — Telephone Encounter (Signed)
 Spoke to pt and her husband. They are very concerned with switching Eliquis  to Plavix after seeing the multiple pages of warnings/side effect info from the pharmacy. They mentioned recent anaphylaxis reaction (not to Plavix) that they have not determined the cause. They want Ozell Passey, PA to look over her medical history to be sure this medication is the right one for her. We discussed in detail why the med is needed, they want to feel comfortable with this medication change. Asking for provider feedback after thorough review of chart.

## 2023-11-03 NOTE — Telephone Encounter (Signed)
 Pt c/o medication issue:  1. Name of Medication: clopidogrel (PLAVIX) 75 MG tablet   2. How are you currently taking this medication (dosage and times per day)?    3. Are you having a reaction (difficulty breathing--STAT)? no  4. What is your medication issue? Has concerns about taking medication, base off the patient's medical history. Need to take to the dr before patient starts taking medication. Please advise

## 2023-11-03 NOTE — Patient Instructions (Addendum)
 Medication Instructions:  Take LAST DOSE of Eliquis  Sunday, 7/20 with the evening dose START Plavix 75 mg daily on Monday, 7/21. *If you need a refill on your cardiac medications before your next appointment, please call your pharmacy*  Lab Work: BMET and CBC to be drawn today.  If you have labs (blood work) drawn today and your tests are completely normal, you will receive your results only by: MyChart Message (if you have MyChart) OR A paper copy in the mail If you have any lab test that is abnormal or we need to change your treatment, we will call you to review the results.  Testing/Procedures: CT scan scheduled for 8/6 per letter  Follow-Up: At [facility], you and your health needs are our priority.  As part of our continuing mission to provide you with exceptional heart care, our providers are all part of one team.  This team includes your primary Cardiologist (physician) and Advanced Practice Providers or APPs (Physician Assistants and Nurse Practitioners) who all work together to provide you with the care you need, when you need it.  Your next appointment:   5 month(s)  Provider:   You may see OLE ONEIDA HOLTS, MD or one of the following Advanced Practice Providers on your designated Care Team:    Or Suzann Riddle, NP

## 2023-11-06 DIAGNOSIS — Z95818 Presence of other cardiac implants and grafts: Secondary | ICD-10-CM | POA: Diagnosis not present

## 2023-11-06 DIAGNOSIS — I4891 Unspecified atrial fibrillation: Secondary | ICD-10-CM | POA: Diagnosis not present

## 2023-11-06 NOTE — Telephone Encounter (Signed)
 Latoya Passey, PA-C's feedback received, still awaiting Dr. Ranae feedback, as pt and her husband specifically wanted both providers to agree with this med recommendation before starting. Copying Dr. Ranae nurse as well.

## 2023-11-07 ENCOUNTER — Ambulatory Visit: Payer: Self-pay | Admitting: Student

## 2023-11-07 LAB — CBC
Hematocrit: 46.9 % — ABNORMAL HIGH (ref 34.0–46.6)
Hemoglobin: 15.2 g/dL (ref 11.1–15.9)
MCH: 31.8 pg (ref 26.6–33.0)
MCHC: 32.4 g/dL (ref 31.5–35.7)
MCV: 98 fL — ABNORMAL HIGH (ref 79–97)
Platelets: 287 x10E3/uL (ref 150–450)
RBC: 4.78 x10E6/uL (ref 3.77–5.28)
RDW: 12.6 % (ref 11.7–15.4)
WBC: 9.9 x10E3/uL (ref 3.4–10.8)

## 2023-11-07 LAB — BASIC METABOLIC PANEL WITH GFR
BUN/Creatinine Ratio: 18 (ref 12–28)
BUN: 17 mg/dL (ref 8–27)
CO2: 15 mmol/L — AB (ref 20–29)
Calcium: 9.3 mg/dL (ref 8.7–10.3)
Chloride: 102 mmol/L (ref 96–106)
Creatinine, Ser: 0.96 mg/dL (ref 0.57–1.00)
Glucose: 105 mg/dL — AB (ref 70–99)
Potassium: 4.6 mmol/L (ref 3.5–5.2)
Sodium: 134 mmol/L (ref 134–144)
eGFR: 59 mL/min/1.73 — AB (ref >59)

## 2023-11-07 NOTE — Telephone Encounter (Signed)
 S/W pt's husband, relayed Ozell Tillery's feedback as stated (see below). He states pt is planning to start Plavix  tonight. He is aware Dr. Barbette is out this week and we will still contact them with his feedback as well. Nothing further needed at this time.    Lesia Ozell Barter, PA-C to Me  Tennie Graeme CROME, CMA   Morning!   This is the standard of care for Watchman patients.     Her recent reaction was felt to be due to Macrobid, and does not change the need for Plavix  to the 6 month mark.    As an anti-platelet it is not a medication that is typically associated with allergic reactions, and as above will be a short term medication.

## 2023-11-15 ENCOUNTER — Encounter: Payer: Self-pay | Admitting: Adult Health

## 2023-11-15 DIAGNOSIS — R5383 Other fatigue: Secondary | ICD-10-CM | POA: Diagnosis not present

## 2023-11-15 DIAGNOSIS — M545 Low back pain, unspecified: Secondary | ICD-10-CM | POA: Diagnosis not present

## 2023-11-15 DIAGNOSIS — M25569 Pain in unspecified knee: Secondary | ICD-10-CM | POA: Diagnosis not present

## 2023-11-15 DIAGNOSIS — R7989 Other specified abnormal findings of blood chemistry: Secondary | ICD-10-CM | POA: Diagnosis not present

## 2023-11-15 DIAGNOSIS — I4891 Unspecified atrial fibrillation: Secondary | ICD-10-CM | POA: Diagnosis not present

## 2023-11-16 ENCOUNTER — Telehealth: Payer: Self-pay | Admitting: Student

## 2023-11-16 NOTE — Telephone Encounter (Signed)
 Called patient back about message. Patient stated she has too many side effects with Plavix , such as tiredness, dizziness, pain in her knees, bruising and she just does not feel like herself. Patient stated she had no issues with Eliquis . Patient had watchman done on June 5. Encouraged patient not to stop plavix  until we hear back from provider.

## 2023-11-16 NOTE — Telephone Encounter (Signed)
 Pt c/o medication issue:  1. Name of Medication: Plavix   2. How are you currently taking this medication (dosage and times per day)?   3. Are you having a reaction (difficulty breathing--STAT)?   4. What is your medication issue? She says she thinks she is having  a reaction to this medicine. She says is very tired, dizziness, pain in her knees. She says she just does not feel like her self

## 2023-11-20 ENCOUNTER — Encounter (HOSPITAL_COMMUNITY): Payer: Self-pay

## 2023-11-20 NOTE — Telephone Encounter (Signed)
 Spoke with the patient at length. She reported fatigue and arm and leg pain since starting Plavix  and she would like to stop the medication. Reiterated to her that she needs to remain on an a/c or antiplatelet through the 6 month mark. She has plenty of Eliquis  left.  Instructed her to STOP PLAVIX  and restart Eliquis  5 mg BID as she was tolerating Eliquis  much better than the Plavix . She will call in 1 week for an update. She understood if her symptoms worsen, to call prior to that time. She also understood that if her symptoms don't improve, her symptoms are likely not from the Plavix .  Will route to Dr. Cindie for his recommendations.

## 2023-11-22 ENCOUNTER — Ambulatory Visit (HOSPITAL_COMMUNITY)
Admission: RE | Admit: 2023-11-22 | Discharge: 2023-11-22 | Disposition: A | Discharge status: Home / Self Care | Source: Ambulatory Visit | Attending: Cardiovascular Disease | Admitting: Cardiovascular Disease

## 2023-11-22 DIAGNOSIS — Z95818 Presence of other cardiac implants and grafts: Secondary | ICD-10-CM | POA: Diagnosis not present

## 2023-11-22 DIAGNOSIS — I517 Cardiomegaly: Secondary | ICD-10-CM | POA: Diagnosis not present

## 2023-11-22 DIAGNOSIS — Q2112 Patent foramen ovale: Secondary | ICD-10-CM | POA: Diagnosis not present

## 2023-11-22 DIAGNOSIS — I4891 Unspecified atrial fibrillation: Secondary | ICD-10-CM

## 2023-11-22 MED ORDER — IOHEXOL 350 MG/ML SOLN
80.0000 mL | Freq: Once | INTRAVENOUS | Status: AC | PRN
  Administered 2023-11-22: 80 mL via INTRAVENOUS

## 2023-11-23 ENCOUNTER — Ambulatory Visit: Payer: Self-pay

## 2023-11-27 DIAGNOSIS — M791 Myalgia, unspecified site: Secondary | ICD-10-CM | POA: Diagnosis not present

## 2023-11-27 DIAGNOSIS — N952 Postmenopausal atrophic vaginitis: Secondary | ICD-10-CM | POA: Diagnosis not present

## 2023-11-27 DIAGNOSIS — I480 Paroxysmal atrial fibrillation: Secondary | ICD-10-CM | POA: Diagnosis not present

## 2023-11-27 DIAGNOSIS — L900 Lichen sclerosus et atrophicus: Secondary | ICD-10-CM | POA: Diagnosis not present

## 2023-11-27 DIAGNOSIS — T886XXD Anaphylactic reaction due to adverse effect of correct drug or medicament properly administered, subsequent encounter: Secondary | ICD-10-CM | POA: Diagnosis not present

## 2023-12-01 ENCOUNTER — Telehealth: Payer: Self-pay | Admitting: Cardiovascular Disease

## 2023-12-01 NOTE — Telephone Encounter (Signed)
 Patient called in regarding her legs being stiff. However, they are not swollen. She states she gets fatigued when she walks - it wears her out. Typically, first thing when she gets up in the morning is when it is at its worse. When she goes to stand up, its really hard for her. She describes it is hard to lift arms and legs real high. Today has been worse - her leg stiffness has not improved at all throughout the day. Denies all other symptoms aside fatigue and leg stiffness - asked multiple times regarding swelling, sob, cp, dizziness, lightheadedness, etc., - patient denies.   Patient is aware that due to time of calling her back from the call back que, her call will be returned by RN Monday.

## 2023-12-01 NOTE — Telephone Encounter (Signed)
 Spoke to patient and husband.She stated she her situation is complicated.Stated she is having pain and stiffness in both arms and legs for the past 2 weeks.Stated she was prescribed a antibiotic this past July.She had a reaction and was admitted to [facility].She saw PCP this week and was told pain and stiffness coming from antibiotic.She was started on Plavix  and Eliquis  around same time antibiotic was prescribed.She requested appointment with Jodie Passey PA.Advised his schedule is full.No appointments available with other extenders next week.Stated she would like Jodie Passey PA's advice.Advised I will send message to him.

## 2023-12-04 NOTE — Telephone Encounter (Signed)
 Called patient left message on personal voice email to call back.

## 2023-12-04 NOTE — Telephone Encounter (Signed)
 Spoke to patient Latoya Passey PA's advice given.

## 2023-12-06 DIAGNOSIS — M6289 Other specified disorders of muscle: Secondary | ICD-10-CM | POA: Diagnosis not present

## 2023-12-06 DIAGNOSIS — M791 Myalgia, unspecified site: Secondary | ICD-10-CM | POA: Diagnosis not present

## 2023-12-06 DIAGNOSIS — R5383 Other fatigue: Secondary | ICD-10-CM | POA: Diagnosis not present

## 2023-12-13 DIAGNOSIS — M791 Myalgia, unspecified site: Secondary | ICD-10-CM | POA: Diagnosis not present

## 2023-12-13 DIAGNOSIS — M25519 Pain in unspecified shoulder: Secondary | ICD-10-CM | POA: Diagnosis not present

## 2023-12-13 DIAGNOSIS — M25559 Pain in unspecified hip: Secondary | ICD-10-CM | POA: Diagnosis not present

## 2023-12-19 ENCOUNTER — Other Ambulatory Visit (HOSPITAL_COMMUNITY): Payer: Self-pay

## 2023-12-20 DIAGNOSIS — L821 Other seborrheic keratosis: Secondary | ICD-10-CM | POA: Diagnosis not present

## 2023-12-20 DIAGNOSIS — Z85820 Personal history of malignant melanoma of skin: Secondary | ICD-10-CM | POA: Diagnosis not present

## 2023-12-20 DIAGNOSIS — D2262 Melanocytic nevi of left upper limb, including shoulder: Secondary | ICD-10-CM | POA: Diagnosis not present

## 2023-12-20 DIAGNOSIS — L570 Actinic keratosis: Secondary | ICD-10-CM | POA: Diagnosis not present

## 2023-12-20 DIAGNOSIS — Z85828 Personal history of other malignant neoplasm of skin: Secondary | ICD-10-CM | POA: Diagnosis not present

## 2023-12-20 DIAGNOSIS — D2261 Melanocytic nevi of right upper limb, including shoulder: Secondary | ICD-10-CM | POA: Diagnosis not present

## 2023-12-20 DIAGNOSIS — D2272 Melanocytic nevi of left lower limb, including hip: Secondary | ICD-10-CM | POA: Diagnosis not present

## 2023-12-20 DIAGNOSIS — D225 Melanocytic nevi of trunk: Secondary | ICD-10-CM | POA: Diagnosis not present

## 2024-01-31 DIAGNOSIS — L218 Other seborrheic dermatitis: Secondary | ICD-10-CM | POA: Diagnosis not present

## 2024-01-31 DIAGNOSIS — L718 Other rosacea: Secondary | ICD-10-CM | POA: Diagnosis not present

## 2024-02-01 DIAGNOSIS — I10 Essential (primary) hypertension: Secondary | ICD-10-CM | POA: Diagnosis not present

## 2024-02-01 DIAGNOSIS — Z23 Encounter for immunization: Secondary | ICD-10-CM | POA: Diagnosis not present

## 2024-02-01 DIAGNOSIS — F1320 Sedative, hypnotic or anxiolytic dependence, uncomplicated: Secondary | ICD-10-CM | POA: Diagnosis not present

## 2024-02-01 DIAGNOSIS — G4700 Insomnia, unspecified: Secondary | ICD-10-CM | POA: Diagnosis not present

## 2024-02-05 DIAGNOSIS — N952 Postmenopausal atrophic vaginitis: Secondary | ICD-10-CM | POA: Diagnosis not present

## 2024-02-05 DIAGNOSIS — L900 Lichen sclerosus et atrophicus: Secondary | ICD-10-CM | POA: Diagnosis not present

## 2024-02-05 DIAGNOSIS — N898 Other specified noninflammatory disorders of vagina: Secondary | ICD-10-CM | POA: Diagnosis not present

## 2024-02-20 DIAGNOSIS — H43813 Vitreous degeneration, bilateral: Secondary | ICD-10-CM | POA: Diagnosis not present

## 2024-02-20 DIAGNOSIS — Z961 Presence of intraocular lens: Secondary | ICD-10-CM | POA: Diagnosis not present

## 2024-02-20 DIAGNOSIS — H04123 Dry eye syndrome of bilateral lacrimal glands: Secondary | ICD-10-CM | POA: Diagnosis not present

## 2024-03-01 DIAGNOSIS — R197 Diarrhea, unspecified: Secondary | ICD-10-CM | POA: Diagnosis not present

## 2024-03-08 DIAGNOSIS — K219 Gastro-esophageal reflux disease without esophagitis: Secondary | ICD-10-CM | POA: Diagnosis not present

## 2024-03-08 DIAGNOSIS — R197 Diarrhea, unspecified: Secondary | ICD-10-CM | POA: Diagnosis not present

## 2024-03-08 DIAGNOSIS — N8184 Pelvic muscle wasting: Secondary | ICD-10-CM | POA: Diagnosis not present

## 2024-03-08 DIAGNOSIS — R748 Abnormal levels of other serum enzymes: Secondary | ICD-10-CM | POA: Diagnosis not present

## 2024-04-01 NOTE — Progress Notes (Unsigned)
 Electrophysiology Office Follow up Visit Note:    Date:  04/01/2024   ID:  [name], DOB 14-Dec-1940, MRN [id number]  PCP:  Rexanne Ingle, MD  [facility] Cardiologist:  Lonni Cash, MD  [facility] Electrophysiologist:  OLE ONEIDA HOLTS, MD    Interval History:     [name] is a 83 y.o. female who presents for a follow up visit.   The patient was last seen by [facility] November 03, 2023.  She has a history of atrial fibrillation, Watchman device in situ.  She is with her husband today in clinic.  She is doing well from a heart perspective and rhythm perspective.  She had good seal of her watchman on repeat imaging following the procedure.  We kept her on Eliquis  because she was not tolerating Plavix  with fatigue.  She went to the hospital yesterday with left thigh pain.  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  Her pain was in the left thigh.  Imaging of the pelvis and femur were unremarkable.  She remains on Eliquis .  She was seen by a vascular surgeon but I am unsure what their final recommendations were.      Past medical, surgical, social and family history were reviewed.  ROS:   Please see the history of present illness.    All other systems reviewed and are negative.  EKGs/Labs/Other Studies Reviewed:    The following studies were reviewed today:          Physical Exam:    VS:  There were no vitals taken for this visit.    Wt Readings from Last 3 Encounters:  11/03/23 174 lb (78.9 kg)  10/28/23 176 lb 9.4 oz (80.1 kg)  09/21/23 172 lb (78 kg)     GEN: no distress CARD: Irregularly irregular, No MRG RESP: No IWOB. CTAB.      ASSESSMENT:    No diagnosis found. PLAN:    In order of problems listed above:  #Persistent atrial fibrillation #Watchman device in situ Doing well after Watchman procedure in June. Good seal and repeat CT scan Still on Eliquis .  Given her  successful Watchman procedure, there is no indication for Eliquis  from a atrial fibrillation perspective.  #?  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  I am unsure with the plan for this long-term would be from a vascular surgery perspective.  For now she is on Eliquis  but I would like to get her off of this medication given history of spontaneous intracranial hemorrhage.  I am going to have her see Dr. Serene in clinic to discuss her imaging findings, need for indefinite anticoagulation for chronic DVT.  #Left thigh pain I do not think her thigh pain is related to her chronic DVT.  It seems to be referred pain from the hip given the pain is worse with motion of the hip joint.  She has an existing relationship with Dr. Vernetta in orthopedic surgery.  I will also put in a referral for his office to get her an appointment to evaluate..  I discussed my upcoming departure from [facility] during today's clinic appointment.  The patient will continue to follow-up with one of my EP partners moving forward.  Follow-up appointment in 3 months is a backstop with Dr. Kennyth.    I will also send a note to Dr. Serene to see if we  can get her in to review her situation.   Signed, Ole Holts, MD, [facility], [facility] 04/01/2024 10:52 AM    Electrophysiology  [facility]

## 2024-04-02 ENCOUNTER — Other Ambulatory Visit: Payer: Self-pay

## 2024-04-02 ENCOUNTER — Emergency Department

## 2024-04-02 ENCOUNTER — Encounter: Payer: Self-pay | Admitting: Emergency Medicine

## 2024-04-02 ENCOUNTER — Emergency Department
Admission: EM | Admit: 2024-04-02 | Discharge: 2024-04-02 | Disposition: A | Discharge status: Home / Self Care | Attending: Emergency Medicine | Admitting: Emergency Medicine

## 2024-04-02 DIAGNOSIS — Z7901 Long term (current) use of anticoagulants: Secondary | ICD-10-CM

## 2024-04-02 DIAGNOSIS — Z85820 Personal history of malignant melanoma of skin: Secondary | ICD-10-CM | POA: Diagnosis not present

## 2024-04-02 DIAGNOSIS — Z9889 Other specified postprocedural states: Secondary | ICD-10-CM

## 2024-04-02 DIAGNOSIS — I82542 Chronic embolism and thrombosis of left tibial vein: Secondary | ICD-10-CM | POA: Diagnosis not present

## 2024-04-02 DIAGNOSIS — I82442 Acute embolism and thrombosis of left tibial vein: Secondary | ICD-10-CM

## 2024-04-02 DIAGNOSIS — M79652 Pain in left thigh: Secondary | ICD-10-CM

## 2024-04-02 DIAGNOSIS — I2699 Other pulmonary embolism without acute cor pulmonale: Secondary | ICD-10-CM | POA: Diagnosis not present

## 2024-04-02 DIAGNOSIS — Z8616 Personal history of COVID-19: Secondary | ICD-10-CM | POA: Diagnosis not present

## 2024-04-02 DIAGNOSIS — Z8542 Personal history of malignant neoplasm of other parts of uterus: Secondary | ICD-10-CM | POA: Diagnosis not present

## 2024-04-02 DIAGNOSIS — Z87891 Personal history of nicotine dependence: Secondary | ICD-10-CM | POA: Diagnosis not present

## 2024-04-02 DIAGNOSIS — Z95818 Presence of other cardiac implants and grafts: Secondary | ICD-10-CM | POA: Diagnosis not present

## 2024-04-02 MED ORDER — TRAMADOL HCL 50 MG PO TABS
50.0000 mg | ORAL_TABLET | Freq: Once | ORAL | Status: AC
  Administered 2024-04-02: 19:00:00 50 mg via ORAL
  Filled 2024-04-02: qty 1

## 2024-04-02 MED ORDER — TRAMADOL HCL 50 MG PO TABS: 50.0000 mg | ORAL_TABLET | Freq: Four times a day (QID) | ORAL | 0 refills | Status: AC | PRN

## 2024-04-02 NOTE — ED Notes (Signed)
 See triage note  Presents with pain to left knee and thigh area  States pain started about 1 week ago    Denies any injury Ambulates slowly to room with family

## 2024-04-02 NOTE — ED Triage Notes (Addendum)
 Pt to ED via POV from home. Pt ambulatory to triage. Pt reports left knee and left thigh pain x1 wk. Denies injury. No relief with pain medication. No hx of DVT. No blood thinners. Denies CP or SOB

## 2024-04-02 NOTE — Discharge Instructions (Signed)
 Keep your scheduled follow-up appointment with Dr. Cindie tomorrow.  When taking the tramadol , be aware that it may make you drowsy or dizzy.  Change positions slowly.  Return to the emergency department for symptoms that change, worsen, or for new concerns if you are unable to schedule an appointment with your primary care provider or see your specialist.

## 2024-04-02 NOTE — Consult Note (Incomplete)
 Hospital Consult    Reason for Consult:  Left Lower extremity Pain with Post tibial DVT Requesting Physician:  Kirk Gentry FNP MRN #:  [id number]  History of Present Illness: This is a 83 y.o. female presenting to the emergency department for treatment and evaluation of nontraumatic left anterior thigh pain that has been ongoing for a week. Patient has a history of PE, but does not have a history of DVT. She also has a history of atrial fibrillation and is currently on Eliquis  5 mg twice per day.  She had a Watchman inserted and is scheduled to see Dr. Cindie in [facility] tomorrow with plan to DC the Eliquis .   Upon workup in the emergency department patient bilateral lower extremity venous Doppler ultrasounds to rule out DVT.  Patient was noted to have what radiology calls an acute nonocclusive clot to her left lower tibial veins.  Vascular surgery was consulted to evaluate.  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 jenel;  started complex partial seizures post 2 wks cva in 1996;  now focal seizures, bilateral black spots visual fields that resolve about 10 min. , tend to happen with stress, no loc is aware of surrounding's,  controlled with topamax   (02-24-2021  pt stated last one approx 6 months ago)   GERD (gastroesophageal reflux disease)    Herpes genitalis    Hiatal hernia 2007   History of COVID-19 04/2019   per pt mild symptoms that resolved   History of hemorrhagic cerebrovascular accident (CVA) with residual deficit 1996   neurologist-- dr c. 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 Dr. 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)    Hemorragic 1996   Thickened endometrium    Vitamin D deficiency    Wears glasses     Past Surgical History:  Procedure Laterality Date   APPENDECTOMY  1955   AUGMENTATION MAMMAPLASTY Bilateral 1975   silicone   BROW LIFT Bilateral 06/07/2019   Procedure: BLEPHAROPLASTY UPPER EYELID WITH EXCESS SKIN;  Surgeon: Ashley Greig HERO, MD;  Location: [facility];  Service: Ophthalmology;  Laterality: Bilateral;   CATARACT EXTRACTION W/ INTRAOCULAR LENS IMPLANT Bilateral    left 06/ 2006;  right 08/ 2009   DILATATION & CURETTAGE/HYSTEROSCOPY WITH MYOSURE N/A 03/02/2021   Procedure: DILATATION & CURETTAGE/HYSTEROSCOPY WITH MYOSURE;  Surgeon: Rosalva Sawyer, MD;  Location: [facility];  Service: Gynecology;  Laterality: N/A;   DILATION AND CURETTAGE OF UTERUS  1972   LEFT ATRIAL APPENDAGE OCCLUSION N/A 09/21/2023   Procedure: LEFT ATRIAL APPENDAGE OCCLUSION;  Surgeon: Cindie Ole DASEN, MD;  Location: [facility] INVASIVE CV LAB;  Service: Cardiovascular;  Laterality: N/A;   MELANOMA EXCISION  07/1997   left shoulder   ROBOTIC ASSISTED TOTAL HYSTERECTOMY WITH BILATERAL SALPINGO OOPHERECTOMY N/A 03/25/2021   Procedure: XI ROBOTIC ASSISTED TOTAL HYSTERECTOMY WITH BILATERAL SALPINGO OOPHORECTOMY;  Surgeon: Viktoria Comer SAUNDERS, MD;  Location: [facility];  Service: Gynecology;  Laterality: N/A;   SENTINEL NODE BIOPSY N/A  03/25/2021   Procedure: SENTINEL NODE BIOPSY;  Surgeon: Viktoria Comer SAUNDERS, MD;  Location: [facility];  Service: Gynecology;  Laterality: N/A;   thrombosed hemorrhoid  surgery     TONSILLECTOMY  1954   TRANSESOPHAGEAL ECHOCARDIOGRAM (CATH LAB) N/A 09/21/2023   Procedure: TRANSESOPHAGEAL ECHOCARDIOGRAM;  Surgeon: Cindie Ole DASEN, MD;  Location: [facility] INVASIVE CV LAB;  Service: Cardiovascular;  Laterality: N/A;   TUBAL LIGATION  1975   VARICOSE VEIN SURGERY  1969   right leg   VULVA /PERINEUM BIOPSY N/A 03/25/2021   Procedure: VULVAR BIOPSY;  Surgeon: Viktoria Comer SAUNDERS, MD;  Location: [facility];  Service: Gynecology;  Laterality: N/A;    Allergies[1]  Prior to Admission medications  Medication Sig Start Date End Date Taking? Authorizing Provider  [Paused] atenolol  (TENORMIN ) 25 MG tablet Take 0.5 tablets (12.5 mg total) by mouth daily. Wait to take this until your doctor or other care provider tells you to start again. 10/28/23   Alexander, Natalie, DO  clobetasol cream (TEMOVATE) 0.05 % Apply 1 application topically 2 (two) times a week.    [provider]  conjugated estrogens (PREMARIN) vaginal cream Place 1 applicator vaginally once a week.    [provider]  docusate sodium  (COLACE) 100 MG capsule Take 100 mg by mouth daily as needed for mild constipation.    [provider]  ELIQUIS  5 MG TABS tablet Take 5 mg by mouth 2 (two) times daily.    [provider]  EPINEPHrine  0.3 mg/0.3 mL IJ SOAJ injection Inject 0.3 mg into the muscle as needed for anaphylaxis. 10/28/23   Alexander, Natalie, DO  famotidine  (PEPCID ) 40 MG tablet Take 40 mg by mouth 2 (two) times daily.    [provider]  furosemide  (LASIX ) 20 MG tablet Take 20 mg by mouth daily as needed for edema or fluid.    [provider]  LORazepam (ATIVAN) 1 MG tablet Take 1 mg by mouth at bedtime. 10/02/17   [provider]  metroNIDAZOLE (METROGEL) 0.75 % gel Apply 1 Application  topically 2 (two) times daily. 06/21/23   [provider]  omeprazole (PRILOSEC) 20 MG capsule Take 20 mg by mouth daily.    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 [place]   Tobacco Use   Smoking status: Former    Current packs/day: 0.00    Types: Cigarettes    Start date: 09/26/1969    Quit date: 09/26/1989    Years since quitting: 34.5   Smokeless tobacco: Never  Vaping  Use   Vaping status: Never Used  Substance and Sexual Activity   Alcohol use: Yes    Alcohol/week: 7.0 standard drinks of alcohol    Types: 7 Glasses of wine per week    Comment: Glass of wine per day   Drug use: Never   Sexual activity: Not Currently    Birth control/protection: Post-menopausal  Other Topics Concern   Not on file  Social History Narrative   Lives at home w/ her husband   Patient drinks 1-2 cups of caffeine daily.   Patient is right handed.   Social Drivers of Health   Tobacco Use: Medium Risk (04/02/2024)   Patient History    Smoking Tobacco Use: Former    Smokeless Tobacco Use: Never    Passive Exposure: Not on Actuary Strain: Not on file  Food Insecurity: No Food Insecurity (10/27/2023)   Epic    Worried About Programme Researcher, Broadcasting/film/video in the Last Year: Never true    Ran Out of Food in the Last Year: Never true  Transportation Needs: No Transportation Needs (10/27/2023)   Epic    Lack of Transportation (Medical): No    Lack of Transportation (Non-Medical): No  Physical Activity: Not on file  Stress: Not on file  Social Connections: Socially Integrated (10/27/2023)   Social Connection  and Isolation Panel    Frequency of Communication with Friends and Family: Twice a week    Frequency of Social Gatherings with Friends and Family: Twice a week    Attends Religious Services: More than 4 times per year    Active Member of [employer] or Organizations: No    Attends Engineer, Structural: More than 4 times per year    Marital Status: Married  Catering Manager Violence: Not At Risk (10/27/2023)   Epic    Fear of Current or Ex-Partner: No    Emotionally Abused: No    Physically Abused: No    Sexually Abused: No  Depression (PHQ2-9): Not on file  Alcohol Screen: Not on file  Housing: Low Risk (10/27/2023)   Epic    Unable to Pay for Housing in the Last Year: No    Number of Times Moved in the Last Year: 0    Homeless in the Last Year: No  Utilities: Not At Risk (10/27/2023)   Epic    Threatened with loss of utilities: No  Health Literacy: Not on file     Family History  Problem Relation Age of Onset   Heart attack Mother    Heart attack Father    Diabetes Father    Atrial fibrillation Father    Lymphoma Brother    Cancer Maternal Grandmother        esophageal   Seizures Neg Hx    Colon cancer Neg Hx    Breast cancer Neg Hx    Ovarian cancer Neg Hx    Endometrial cancer Neg Hx    Pancreatic cancer Neg Hx    Prostate cancer Neg Hx    Stroke Neg Hx     ROS: Otherwise negative unless mentioned in HPI  Physical Examination  Vitals:   04/02/24 1350 04/02/24 1650  BP: (!) 95/58 (!) 157/68  Pulse:  68  Resp:  18  Temp:    SpO2:  98%   Body mass index is 30.85 kg/m.  General:  WDWN in NAD Gait: Not observed HENT: WNL, normocephalic Pulmonary: normal non-labored breathing, without Rales, rhonchi,  wheezing Cardiac: irregular, Hx  of atrial fibrillation watchman placement, without  Murmurs, rubs or gallops; without carotid bruits Abdomen: Positive bowel sounds throughout, soft, NT/ND, no masses Skin: without rashes Vascular Exam/Pulses: All  extremities are warm to touch with palpable pulses. Extremities: without ischemic changes, without Gangrene , without cellulitis; without open wounds;  Musculoskeletal: no muscle wasting or atrophy  Neurologic: A&O X 3;  No focal weakness or paresthesias are detected; speech is fluent/normal Psychiatric:  The pt has Normal affect. Lymph:  Unremarkable  CBC    Component Value Date/Time   WBC 9.9 11/06/2023 1349   WBC 15.4 (H) 10/28/2023 0530   RBC 4.78 11/06/2023 1349   RBC 4.18 10/28/2023 0530   HGB 15.2 11/06/2023 1349   HCT 46.9 (H) 11/06/2023 1349   PLT 287 11/06/2023 1349   MCV 98 (H) 11/06/2023 1349   MCH 31.8 11/06/2023 1349   MCH 32.3 10/28/2023 0530   MCHC 32.4 11/06/2023 1349   MCHC 33.8 10/28/2023 0530   RDW 12.6 11/06/2023 1349   LYMPHSABS 0.7 10/27/2023 0349   MONOABS 0.3 10/27/2023 0349   EOSABS 0.3 10/27/2023 0349   BASOSABS 0.1 10/27/2023 0349    BMET    Component Value Date/Time   NA 134 11/06/2023 1349   K 4.6 11/06/2023 1349   CL 102 11/06/2023 1349   CO2 15 (L) 11/06/2023 1349   GLUCOSE 105 (H) 11/06/2023 1349   GLUCOSE 108 (H) 10/28/2023 0530   BUN 17 11/06/2023 1349   CREATININE 0.96 11/06/2023 1349   CALCIUM  9.3 11/06/2023 1349   GFRNONAA >60 10/28/2023 0530   GFRAA 109 09/01/2006 1405    COAGS: Lab Results  Component Value Date   INR 1.9 (H) 10/27/2023   INR 1.0 03/25/2021     Non-Invasive Vascular Imaging:   EXAM: LEFT LOWER EXTREMITY VENOUS DOPPLER ULTRASOUND   TECHNIQUE: Gray-scale sonography with graded compression, as well as color Doppler and duplex ultrasound were performed to evaluate the left lower extremity deep venous systems from the level of the common femoral vein and including the common femoral, femoral, profunda femoral, popliteal and calf veins including the posterior tibial, peroneal and gastrocnemius veins when visible. Spectral Doppler was utilized to evaluate flow at rest and with distal  augmentation maneuvers in the common femoral, femoral and popliteal veins. The contralateral common femoral vein was also evaluated for comparison.   COMPARISON:  None Available.   FINDINGS: LEFT LOWER EXTREMITY   Common Femoral Vein: No evidence of thrombus. Normal compressibility, respiratory phasicity and response to augmentation.   Central Greater Saphenous Vein: No evidence of thrombus. Normal compressibility and flow on color Doppler imaging.   Central Profunda Femoral Vein: No evidence of thrombus. Normal compressibility and flow on color Doppler imaging.   Femoral Vein: No evidence of thrombus. Normal compressibility, respiratory phasicity and response to augmentation.   Popliteal Vein: No evidence of thrombus. Normal compressibility, respiratory phasicity and response to augmentation.   Calf Veins: Acute appearing nonocclusive deep vein thrombosis in the central posterior tibial veins. The peroneal vein is patent.   Other Findings:  None.   RIGHT LOWER EXTREMITY   Common Femoral Vein: No evidence of thrombus. Normal compressibility, respiratory phasicity and response to augmentation.   IMPRESSION: Acute, nonocclusive deep vein thrombosis limited to the central posterior tibial veins. No evidence of iliofemoral extension.   Ester Sides, MD   [facility]   [facility]  Statin:  No. Beta Blocker:  Yes.   Aspirin:  No. ACEI:  No.  ARB:  No. CCB use:  No Other antiplatelets/anticoagulants:  Yes.   Eliquis  5 mg BID   ASSESSMENT/PLAN: This is a 82 y.o. female who presents to [facility] emergency department with left anterior thigh pain.  Patient seen by Dr. Cindie Morita cardiologist.  He placed a Watchman for her chronic A-fib.  Patient at that time was placed on Eliquis  for 45 days.  Dr. Hiram note says she was to be changed from the Eliquis  to Plavix  daily at the 45-day mark.  The conversation with the  patient and her husband at the bedside there was a phone conversation where she decided she was going to stay on her Eliquis  and not transition to the Plavix .  This creates an issue as now she has a nonocclusive clot to her tibial veins in her left lower extremity while being on Eliquis .  This could be deemed an Eliquis  failure.  However after reviewing ultrasounds myself with Dr. Cordella Shawl the radiology call that this is an acute occlusive DVT I believe is an area.  I believe she has had a chronic clot to her left lower extremity that was not identified prior to starting the Eliquis  with the watchman.  Therefore I do not believe that she has had an Eliquis  failure I believe she has been maintained well being on the Eliquis .  How this relates to her left upper thigh pain I am not sure.  The emergency department did some x-rays to rule out any type of fracture or bone issue and these were all negative.  Therefore I recommend that she follow-up with Dr. 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  Treatment for this is anticoagulation as there is no intervention available for chronic  clot removal to her lower extremity.   -I discussed the case in detail with Dr. Cordella Shawl MD and he agrees with the plan.   Gwendlyn JONELLE Shank [facility] 04/02/2024 5:28 PM     [1]  Allergies Allergen Reactions   Citalopram     Sleepy, dull feeling   Macrobid [Nitrofurantoin Macrocrystal] Anaphylaxis    Hospitalization 10/2023   Augmentin [Amoxicillin-Pot Clavulanate] Diarrhea    severe   Bacitracin  Other (See Comments)   Doxycycline Diarrhea   Doxycycline Monohydrate Diarrhea   Moxifloxacin     Redness, non-healing (eye)   Moxifloxacin Hcl     Other reaction(s): redness (eyes)   Latex Rash    Bandaids/tape Other reaction(s): redness   Septra [Sulfamethoxazole-Trimethoprim] Rash   Tape Rash    Zithromax [Azithromycin] Rash

## 2024-04-02 NOTE — ED Notes (Signed)
 Pt verbalizes understanding of discharge instructions. Opportunity for questioning and answers were provided. Pt discharged from ED with husband.

## 2024-04-02 NOTE — ED Notes (Signed)
Resting at present   

## 2024-04-02 NOTE — ED Provider Notes (Signed)
 [facility] Provider Note    Event Date/Time   First MD Initiated Contact with Patient 04/02/24 1420     (approximate)   History   Knee Pain and Leg Pain   HPI  [name] is a 83 y.o. female with history of PE on Eliquis , GERD, CVA, and as listed in EMR presents to the emergency department for treatment and evaluation of nontraumatic anterior left thigh pain for the past week. No known injury.   Physical Exam    Vitals:   04/02/24 1350 04/02/24 1650  BP: (!) 95/58 (!) 157/68  Pulse:  68  Resp:  18  Temp:    SpO2:  98%    General: Awake, no distress.  CV:  Good peripheral perfusion.  Resp:  Normal effort.  Abd:  No distention.  Other:     ED Results / Procedures / Treatments   Labs (all labs ordered are listed, but only abnormal results are displayed)  Labs Reviewed - No data to display   EKG  Not indicated.   RADIOLOGY  Image and radiology report reviewed and interpreted by me. Radiology report consistent with the same.  US  shows acute, nonocclusive deep vein thrombosis limited to central posterior tibial veins.  No evidence of iliofemoral extension.  PROCEDURES:  Critical Care performed: No  Procedures   MEDICATIONS ORDERED IN ED:  Medications  traMADol  (ULTRAM ) tablet 50 mg (has no administration in time range)     IMPRESSION / MDM / ASSESSMENT AND PLAN / ED COURSE   I have reviewed the triage note and vital signs. Vital signs are stable   Differential diagnosis includes, but is not limited to, DVT, musculoskeletal pain, phlebitis,  Patient's presentation is most consistent with acute illness / injury with system symptoms.  83 year old female presenting to the emergency department for treatment and evaluation of nontraumatic left anterior thigh pain that has been ongoing for a week.  See HPI for further details.  Exam is overall reassuring.  Ultrasound ordered to rule out DVT.  Patient has a history of  PE, but does not have a history of DVT. She also has a history of atrial fibrillation and is currently on Eliquis  5 mg twice per day.  She had a Watchman inserted and is scheduled to see Dr. Cindie in [facility] tomorrow with plan to DC the Eliquis .  Clinical Course as of 04/02/24 1822  Tue Apr 02, 2024  1618 Patient ambulatory to bathroom. Awaiting radiology reading of US . [CT]  1710 Consulted with Dr. Jama and Redell, NP with vein and vascular who examined the patient.  Pain in the anterior thigh not likely related to nonocclusive posterior tibial vein DVT. She will be advised to continue her Eliquis  and keep her scheduled follow up with Dr. Cindie tomorrow as scheduled.   Plan will be to get imaging of the pelvis and femur to ensure no acute bony abnormality. Patient and husband agreeable to the plan. [CT]  1820 X-ray images of the pelvis and femur are negative for acute concerns.  Results discussed with the patient and husband.  Plan will be to give her a tablet of tramadol  prior to discharge into the prescription to her pharmacy.  Medication teaching provided.  Outpatient follow-up with primary care and cardiology discussed.  She will keep her scheduled appointment for tomorrow.  ER return precautions given as well.  [CT]    Clinical Course User Index [CT] Erickson Yamashiro, Kirk NOVAK, FNP     FINAL  CLINICAL IMPRESSION(S) / ED DIAGNOSES   Final diagnoses:  Deep vein thrombosis (DVT) of tibial vein of left lower extremity, unspecified chronicity (HCC)  Pain of left thigh     Rx / DC Orders   ED Discharge Orders          Ordered    traMADol  (ULTRAM ) 50 MG tablet  Every 6 hours PRN        04/02/24 1817             Note:  This document was prepared using Dragon voice recognition software and may include unintentional dictation errors.   Herlinda Kirk NOVAK, FNP 04/02/24 TRENNA Willo Dunnings, MD 04/03/24 1539

## 2024-04-03 ENCOUNTER — Ambulatory Visit: Attending: Cardiology | Admitting: Cardiology

## 2024-04-03 ENCOUNTER — Encounter: Payer: Self-pay | Admitting: Cardiology

## 2024-04-03 VITALS — BP 126/74 | HR 65 | Ht 63.5 in | Wt 173.0 lb

## 2024-04-03 DIAGNOSIS — Z95818 Presence of other cardiac implants and grafts: Secondary | ICD-10-CM

## 2024-04-03 DIAGNOSIS — I4891 Unspecified atrial fibrillation: Secondary | ICD-10-CM | POA: Diagnosis not present

## 2024-04-03 DIAGNOSIS — I82442 Acute embolism and thrombosis of left tibial vein: Secondary | ICD-10-CM

## 2024-04-03 NOTE — Patient Instructions (Addendum)
 Medication Instructions:  Your physician recommends that you continue on your current medications as directed. Please refer to the Current Medication list given to you today.  *If you need a refill on your cardiac medications before your next appointment, please call your pharmacy*  Follow-Up: At [facility], you and your health needs are our priority.  As part of our continuing mission to provide you with exceptional heart care, our providers are all part of one team.  This team includes your primary Cardiologist (physician) and Advanced Practice Providers or APPs (Physician Assistants and Nurse Practitioners) who all work together to provide you with the care you need, when you need it.  Your next appointment:   3 months  Provider:   Fonda Kitty, MD     You have been referred to a vascular surgeon - Dr. Serene Lanius have been referred to a Orthopedic surgeon - Dr. Vernetta

## 2024-04-09 ENCOUNTER — Other Ambulatory Visit: Payer: Self-pay | Admitting: Obstetrics and Gynecology

## 2024-04-09 DIAGNOSIS — Z1231 Encounter for screening mammogram for malignant neoplasm of breast: Secondary | ICD-10-CM

## 2024-04-15 ENCOUNTER — Telehealth: Payer: Self-pay

## 2024-04-15 NOTE — Telephone Encounter (Signed)
 Spoke with pt on 04/15/2024 who called about canceling her appt. MP offered to r/s appt, however, pt did not want to r/s and it would be a couple of months before she would r/s.Pt also states that she is almost out of her temporary blood thinner and is choosing not to continue with anti coagulant treatment. MP asked pt if she understood the possible repercussions as a result of stopping the medication. Pt states she does and it was a decision her and her doctor made together. MP repeated back with the pt wanted and confirmed that she did not want to r/s at this time. MP.

## 2024-04-16 ENCOUNTER — Ambulatory Visit: Admitting: Pharmacist

## 2024-05-01 ENCOUNTER — Ambulatory Visit (INDEPENDENT_AMBULATORY_CARE_PROVIDER_SITE_OTHER): Admitting: Orthopaedic Surgery

## 2024-05-01 ENCOUNTER — Ambulatory Visit

## 2024-05-01 ENCOUNTER — Other Ambulatory Visit: Payer: Self-pay | Admitting: Radiology

## 2024-05-01 ENCOUNTER — Encounter: Payer: Self-pay | Admitting: Orthopaedic Surgery

## 2024-05-01 ENCOUNTER — Other Ambulatory Visit (INDEPENDENT_AMBULATORY_CARE_PROVIDER_SITE_OTHER)

## 2024-05-01 DIAGNOSIS — M5442 Lumbago with sciatica, left side: Secondary | ICD-10-CM | POA: Diagnosis not present

## 2024-05-01 MED ORDER — METHYLPREDNISOLONE 4 MG PO TABS: ORAL_TABLET | ORAL | 0 refills | Status: AC

## 2024-05-01 MED ORDER — TIZANIDINE HCL 2 MG PO TABS: 2.0000 mg | ORAL_TABLET | Freq: Three times a day (TID) | ORAL | 0 refills | Status: AC | PRN

## 2024-05-01 NOTE — Progress Notes (Signed)
 The patient is an 84 year old female that I am seeing for the first time as a patient but have taken care of her husband before.  She comes in with worsening left thigh pain but radicular symptoms going down her left leg from her back.  She reports no cramping and denies any groin pain.  She does have pain with walking.  She cannot take anti-inflammatories because she is on Eliquis .  Her primary care physician has provided tramadol  and that has helped some.  On exam she seems to be uncomfortable to the lower lumbar spine to the right and left side.  She seems to have a positive straight leg raise on the left side but she does have some pain in the back of her leg but not so much the knee itself.  2 views of the lumbar spine show no acute findings.  I did review x-rays of her pelvis and her femur from the ER from last month as well as her knee.  She does have a little bit of narrowing of the joint space on the medial aspect of her left knee but really no acute findings for the pelvis and hips or the knee itself or femur.  Given her continued radicular symptoms were going to try a steroid taper with methylprednisolone  Dosepak as well as some tizanidine .  I recommended Voltaren gel for her back which can be safer with someone on Eliquis .  I have also recommended Salonpas patches.  With that being said, given her worsening radicular symptoms, a MRI of her lumbar spine is warranted to rule out nerve compression.  We will see her back once we have that MRI.  Her and her husband agree with this treatment plan.

## 2024-05-03 ENCOUNTER — Telehealth: Payer: Self-pay | Admitting: Orthopaedic Surgery

## 2024-05-03 NOTE — Telephone Encounter (Signed)
 Pt called saying that she was scheduled for an MRI in [place], But it was supposed to be scheduled in [place] and would like to have it changed. Call back number is [phone]

## 2024-05-06 NOTE — Telephone Encounter (Signed)
 Spoke with pt. I informed her when she got scheduled someone should have given her the [place] location, she said she asked for it but still scheduled at [facility].

## 2024-05-08 ENCOUNTER — Ambulatory Visit

## 2024-05-09 ENCOUNTER — Ambulatory Visit
Admission: RE | Admit: 2024-05-09 | Discharge: 2024-05-09 | Disposition: A | Discharge status: Home / Self Care | Source: Ambulatory Visit | Attending: Orthopaedic Surgery | Admitting: Orthopaedic Surgery

## 2024-05-09 DIAGNOSIS — M5442 Lumbago with sciatica, left side: Secondary | ICD-10-CM

## 2024-05-10 ENCOUNTER — Ambulatory Visit

## 2024-05-15 ENCOUNTER — Ambulatory Visit: Admitting: Orthopaedic Surgery

## 2024-05-15 ENCOUNTER — Encounter: Payer: Self-pay | Admitting: Orthopaedic Surgery

## 2024-05-15 ENCOUNTER — Other Ambulatory Visit: Payer: Self-pay | Admitting: Internal Medicine

## 2024-05-15 DIAGNOSIS — M5442 Lumbago with sciatica, left side: Secondary | ICD-10-CM | POA: Diagnosis not present

## 2024-05-15 DIAGNOSIS — R7989 Other specified abnormal findings of blood chemistry: Secondary | ICD-10-CM

## 2024-05-15 NOTE — Progress Notes (Signed)
 The patient is an 84 year old female comes in for follow-up after having an MRI of her lumbar spine that is significant low back pain.  She reports that she has been slowly feeling better.  She had had no recent injury but did have a hard fall back in the summer when she had some anaphylaxis from a antibiotic.  She does look much more comfortable overall and her back pain is only mild and almost minimal.  The MRI of her lumbar spine showed no stenosis and no nerve compression but there is an acute to subacute L4 compression fracture with minimal loss of height.  Since she is doing better this is a good thing and I did go over the MRI report with her and showed a spine model explaining what is going on with her spine.  Fortunately since she is better there is really no treatment that would recommend other than being careful with heavy lifting and going slow until she feels completely better.  Overall though I think she looks good.  The only thing I would recommend is considering an over-the-counter lumbar spine support brace or even physical therapy if it is not getting better for her.

## 2024-05-16 ENCOUNTER — Ambulatory Visit
Admission: RE | Admit: 2024-05-16 | Discharge: 2024-05-16 | Disposition: A | Discharge status: Home / Self Care | Source: Ambulatory Visit | Attending: Internal Medicine | Admitting: Internal Medicine

## 2024-05-16 DIAGNOSIS — R7989 Other specified abnormal findings of blood chemistry: Secondary | ICD-10-CM

## 2024-05-17 ENCOUNTER — Ambulatory Visit

## 2024-05-19 ENCOUNTER — Other Ambulatory Visit

## 2024-05-30 ENCOUNTER — Ambulatory Visit

## 2024-06-05 ENCOUNTER — Ambulatory Visit

## 2024-07-04 ENCOUNTER — Ambulatory Visit: Admitting: Neurology

## 2024-07-09 ENCOUNTER — Ambulatory Visit: Admitting: Cardiology
